# Patient Record
Sex: Female | Born: 1950 | Race: Black or African American | Hispanic: No | Marital: Single | Smoking: Never smoker
Health system: Southern US, Community
[De-identification: ages and names within clinical notes are randomized; demographics above are authoritative.]

## PROBLEM LIST (undated history)

## (undated) DIAGNOSIS — I4892 Unspecified atrial flutter: Secondary | ICD-10-CM

## (undated) DIAGNOSIS — I480 Paroxysmal atrial fibrillation: Secondary | ICD-10-CM

## (undated) DIAGNOSIS — I10 Essential (primary) hypertension: Secondary | ICD-10-CM

## (undated) DIAGNOSIS — G4733 Obstructive sleep apnea (adult) (pediatric): Secondary | ICD-10-CM

## (undated) HISTORY — DX: Unspecified atrial flutter: I48.92

## (undated) HISTORY — PX: TUBAL LIGATION: SHX77

## (undated) HISTORY — DX: Obstructive sleep apnea (adult) (pediatric): G47.33

---

## 2004-02-13 ENCOUNTER — Emergency Department (HOSPITAL_COMMUNITY): Admission: EM | Admit: 2004-02-13 | Discharge: 2004-02-13 | Payer: Self-pay | Admitting: Emergency Medicine

## 2010-03-07 ENCOUNTER — Encounter: Payer: Self-pay | Admitting: Internal Medicine

## 2011-01-01 ENCOUNTER — Emergency Department (HOSPITAL_COMMUNITY)
Admission: EM | Admit: 2011-01-01 | Discharge: 2011-01-01 | Disposition: A | Discharge status: Home / Self Care | Payer: 59 | Attending: Emergency Medicine | Admitting: Emergency Medicine

## 2011-01-01 DIAGNOSIS — R059 Cough, unspecified: Secondary | ICD-10-CM | POA: Insufficient documentation

## 2011-01-01 DIAGNOSIS — R070 Pain in throat: Secondary | ICD-10-CM | POA: Insufficient documentation

## 2011-01-01 DIAGNOSIS — IMO0001 Reserved for inherently not codable concepts without codable children: Secondary | ICD-10-CM | POA: Insufficient documentation

## 2011-01-01 DIAGNOSIS — R112 Nausea with vomiting, unspecified: Secondary | ICD-10-CM | POA: Insufficient documentation

## 2011-01-01 DIAGNOSIS — R5381 Other malaise: Secondary | ICD-10-CM | POA: Insufficient documentation

## 2011-01-01 DIAGNOSIS — J40 Bronchitis, not specified as acute or chronic: Secondary | ICD-10-CM | POA: Insufficient documentation

## 2011-01-01 DIAGNOSIS — R05 Cough: Secondary | ICD-10-CM | POA: Insufficient documentation

## 2011-01-01 DIAGNOSIS — J069 Acute upper respiratory infection, unspecified: Secondary | ICD-10-CM | POA: Insufficient documentation

## 2011-01-01 MED ORDER — PROMETHAZINE-CODEINE 6.25-10 MG/5ML PO SYRP: 5.0000 mL | ORAL_SOLUTION | ORAL | Status: AC | PRN

## 2011-01-01 MED ORDER — PREDNISONE 10 MG PO TABS: ORAL_TABLET | ORAL | Status: DC

## 2011-01-01 MED ORDER — IPRATROPIUM BROMIDE 0.03 % NA SOLN: 2.0000 | Freq: Two times a day (BID) | NASAL | Status: DC

## 2011-01-01 MED ORDER — ONDANSETRON 4 MG PO TBDP
4.0000 mg | ORAL_TABLET | Freq: Once | ORAL | Status: AC
  Administered 2011-01-01: 4 mg via ORAL
  Filled 2011-01-01: qty 1

## 2011-01-01 MED ORDER — ALBUTEROL SULFATE HFA 108 (90 BASE) MCG/ACT IN AERS
2.0000 | INHALATION_SPRAY | RESPIRATORY_TRACT | Status: DC
  Administered 2011-01-01: 2 via RESPIRATORY_TRACT
  Filled 2011-01-01: qty 6.7

## 2011-01-01 MED ORDER — AEROCHAMBER PLUS W/MASK MISC
1.0000 | Freq: Once | Status: AC
  Administered 2011-01-01: 1

## 2011-01-01 MED ORDER — PREDNISONE 20 MG PO TABS
60.0000 mg | ORAL_TABLET | Freq: Once | ORAL | Status: AC
  Administered 2011-01-01: 60 mg via ORAL
  Filled 2011-01-01: qty 3

## 2011-01-01 NOTE — ED Notes (Signed)
Ordered inhaler and aero chamber given to patient by respiratory therapist.

## 2011-01-01 NOTE — ED Notes (Signed)
Pt reports started having cold symptoms Wednesday while at work.  C/O body aches and vomiting since yesterday.  Pt hoarse, audible wheezing.  Reports productive cough with clear sputum.  Unsure if has had fevers.

## 2011-01-01 NOTE — ED Provider Notes (Signed)
Medical screening examination/treatment/procedure(s) were performed by non-physician practitioner and as supervising physician I was immediately available for consultation/collaboration.   Shelda Jakes, MD 01/01/11 [phone]

## 2011-01-01 NOTE — ED Provider Notes (Signed)
History     CSN: [id number] Arrival date & time: 01/01/2011  8:08 AM   First MD Initiated Contact with Patient 01/01/11 [phone]      Chief Complaint  Patient presents with  . URI    (Consider location/radiation/quality/duration/timing/severity/associated sxs/prior treatment) Patient is a 60 y.o. female presenting with URI. The history is provided by the patient.  URI The primary symptoms include fatigue, sore throat, cough, wheezing, nausea, vomiting and myalgias. Primary symptoms do not include ear pain, abdominal pain or arthralgias. The current episode started 3 to 5 days ago. This is a new problem.  The onset of the illness is associated with exposure to sick contacts. Symptoms associated with the illness include chills, sinus pressure and congestion. Risk factors: asthma.    Past Medical History  Diagnosis Date  . Asthma     History reviewed. No pertinent past surgical history.  No family history on file.  History  Substance Use Topics  . Smoking status: Never Smoker   . Smokeless tobacco: Not on file  . Alcohol Use: No    OB History    Grav Para Term Preterm Abortions TAB SAB Ect Mult Living                  Review of Systems  Constitutional: Positive for chills and fatigue. Negative for activity change.       All ROS Neg except as noted in HPI  HENT: Positive for congestion, sore throat and sinus pressure. Negative for ear pain, nosebleeds and neck pain.   Eyes: Negative for photophobia and discharge.  Respiratory: Positive for cough and wheezing. Negative for shortness of breath.   Cardiovascular: Negative for chest pain and palpitations.  Gastrointestinal: Positive for nausea and vomiting. Negative for abdominal pain and blood in stool.  Genitourinary: Negative for dysuria, frequency and hematuria.  Musculoskeletal: Positive for myalgias. Negative for back pain and arthralgias.  Skin: Negative.   Neurological: Negative for dizziness, seizures and speech  difficulty.  Psychiatric/Behavioral: Negative for hallucinations and confusion.    Allergies  Strawberry c  Home Medications  No current outpatient prescriptions on file.  BP 145/67  Pulse 73  Temp(Src) 98.9 F (37.2 C) (Oral)  Resp 22  Ht 5\' 5"  (1.651 m)  Wt 262 lb (118.842 kg)  BMI 43.60 kg/m2  SpO2 97%  Physical Exam  Nursing note and vitals reviewed. Constitutional: She is oriented to person, place, and time. She appears well-developed and well-nourished.  Non-toxic appearance.  HENT:  Head: Normocephalic.  Right Ear: Tympanic membrane and external ear normal.  Left Ear: Tympanic membrane and external ear normal.       Nasal congestion present.  Eyes: EOM and lids are normal. Pupils are equal, round, and reactive to light.  Neck: Normal range of motion. Neck supple. Carotid bruit is not present.  Cardiovascular: Normal rate, regular rhythm, normal heart sounds, intact distal pulses and normal pulses.  Exam reveals no friction rub.   Pulmonary/Chest: Tachypnea noted. No respiratory distress. She has wheezes. She has rhonchi.       Frequent cough  Abdominal: Soft. Bowel sounds are normal. There is no tenderness. There is no guarding.  Musculoskeletal: Normal range of motion.  Lymphadenopathy:       Head (right side): No submandibular adenopathy present.       Head (left side): No submandibular adenopathy present.    She has no cervical adenopathy.  Neurological: She is alert and oriented to person, place, and time. She has  normal strength. No cranial nerve deficit or sensory deficit.  Skin: Skin is warm and dry.  Psychiatric: She has a normal mood and affect. Her speech is normal.    ED Course: 9:37 - Pt breathing much easier. Wheezing improving. No vomiting after medications.  Procedures (including critical care time)  Labs Reviewed - No data to display No results found.   Dx: 1. Bronchitis  2. URI   MDM  I have reviewed nursing notes, vital signs, and all  appropriate lab and imaging results for this patient. Pt breathing easier. Wheezing improving. Pulse Ox in acceptable range. Safe for pt to be d/c home. She is to return if any changes or problem.        Kathie Dike, [place] 01/01/11 [phone]

## 2013-07-31 ENCOUNTER — Encounter (INDEPENDENT_AMBULATORY_CARE_PROVIDER_SITE_OTHER): Payer: Self-pay | Admitting: *Deleted

## 2013-07-31 ENCOUNTER — Other Ambulatory Visit (HOSPITAL_COMMUNITY): Payer: Self-pay | Admitting: Internal Medicine

## 2013-07-31 DIAGNOSIS — Z1231 Encounter for screening mammogram for malignant neoplasm of breast: Secondary | ICD-10-CM

## 2013-08-05 ENCOUNTER — Ambulatory Visit (HOSPITAL_COMMUNITY)
Admission: RE | Admit: 2013-08-05 | Discharge: 2013-08-05 | Disposition: A | Discharge status: Home / Self Care | Payer: 59 | Source: Ambulatory Visit | Attending: Internal Medicine | Admitting: Internal Medicine

## 2013-08-05 DIAGNOSIS — Z1231 Encounter for screening mammogram for malignant neoplasm of breast: Secondary | ICD-10-CM | POA: Insufficient documentation

## 2013-08-16 ENCOUNTER — Emergency Department (HOSPITAL_COMMUNITY)
Admission: EM | Admit: 2013-08-16 | Discharge: 2013-08-16 | Disposition: A | Discharge status: Home / Self Care | Payer: BC Managed Care – PPO | Attending: Emergency Medicine | Admitting: Emergency Medicine

## 2013-08-16 ENCOUNTER — Emergency Department (HOSPITAL_COMMUNITY): Payer: BC Managed Care – PPO

## 2013-08-16 ENCOUNTER — Encounter (HOSPITAL_COMMUNITY): Payer: Self-pay | Admitting: Emergency Medicine

## 2013-08-16 DIAGNOSIS — Z79899 Other long term (current) drug therapy: Secondary | ICD-10-CM | POA: Insufficient documentation

## 2013-08-16 DIAGNOSIS — J189 Pneumonia, unspecified organism: Secondary | ICD-10-CM

## 2013-08-16 DIAGNOSIS — J159 Unspecified bacterial pneumonia: Secondary | ICD-10-CM | POA: Insufficient documentation

## 2013-08-16 DIAGNOSIS — J40 Bronchitis, not specified as acute or chronic: Secondary | ICD-10-CM

## 2013-08-16 DIAGNOSIS — J45901 Unspecified asthma with (acute) exacerbation: Secondary | ICD-10-CM | POA: Insufficient documentation

## 2013-08-16 DIAGNOSIS — M25579 Pain in unspecified ankle and joints of unspecified foot: Secondary | ICD-10-CM | POA: Insufficient documentation

## 2013-08-16 LAB — CBC WITH DIFFERENTIAL/PLATELET
BASOS PCT: 0 % (ref 0–1)
Basophils Absolute: 0 10*3/uL (ref 0.0–0.1)
EOS ABS: 0.3 10*3/uL (ref 0.0–0.7)
Eosinophils Relative: 4 % (ref 0–5)
HCT: 37.2 % (ref 36.0–46.0)
Hemoglobin: 12.5 g/dL (ref 12.0–15.0)
Lymphocytes Relative: 19 % (ref 12–46)
Lymphs Abs: 1.4 10*3/uL (ref 0.7–4.0)
MCH: 29.8 pg (ref 26.0–34.0)
MCHC: 33.6 g/dL (ref 30.0–36.0)
MCV: 88.8 fL (ref 78.0–100.0)
Monocytes Absolute: 0.4 10*3/uL (ref 0.1–1.0)
Monocytes Relative: 6 % (ref 3–12)
NEUTROS ABS: 5.1 10*3/uL (ref 1.7–7.7)
NEUTROS PCT: 71 % (ref 43–77)
PLATELETS: 190 10*3/uL (ref 150–400)
RBC: 4.19 MIL/uL (ref 3.87–5.11)
RDW: 13.8 % (ref 11.5–15.5)
WBC: 7.3 10*3/uL (ref 4.0–10.5)

## 2013-08-16 LAB — BASIC METABOLIC PANEL
ANION GAP: 12 (ref 5–15)
BUN: 8 mg/dL (ref 6–23)
CALCIUM: 9.2 mg/dL (ref 8.4–10.5)
CO2: 26 mEq/L (ref 19–32)
CREATININE: 0.7 mg/dL (ref 0.50–1.10)
Chloride: 105 mEq/L (ref 96–112)
Glucose, Bld: 107 mg/dL — ABNORMAL HIGH (ref 70–99)
Potassium: 3.7 mEq/L (ref 3.7–5.3)
Sodium: 143 mEq/L (ref 137–147)

## 2013-08-16 MED ORDER — PREDNISONE 50 MG PO TABS
60.0000 mg | ORAL_TABLET | Freq: Once | ORAL | Status: AC
  Administered 2013-08-16: 60 mg via ORAL
  Filled 2013-08-16 (×2): qty 1

## 2013-08-16 MED ORDER — AZITHROMYCIN 250 MG PO TABS
500.0000 mg | ORAL_TABLET | Freq: Once | ORAL | Status: AC
  Administered 2013-08-16: 500 mg via ORAL
  Filled 2013-08-16: qty 2

## 2013-08-16 MED ORDER — IPRATROPIUM-ALBUTEROL 0.5-2.5 (3) MG/3ML IN SOLN
3.0000 mL | Freq: Once | RESPIRATORY_TRACT | Status: AC
  Administered 2013-08-16: 3 mL via RESPIRATORY_TRACT
  Filled 2013-08-16: qty 3

## 2013-08-16 MED ORDER — DOXYCYCLINE HYCLATE 100 MG PO CAPS: 100.0000 mg | ORAL_CAPSULE | Freq: Two times a day (BID) | ORAL | Status: DC

## 2013-08-16 MED ORDER — PREDNISONE 50 MG PO TABS: ORAL_TABLET | ORAL | Status: DC

## 2013-08-16 MED ORDER — ALBUTEROL SULFATE HFA 108 (90 BASE) MCG/ACT IN AERS
2.0000 | INHALATION_SPRAY | Freq: Once | RESPIRATORY_TRACT | Status: AC
  Administered 2013-08-16: 2 via RESPIRATORY_TRACT
  Filled 2013-08-16: qty 6.7

## 2013-08-16 MED ORDER — CEFTRIAXONE SODIUM 1 G IJ SOLR
1.0000 g | Freq: Once | INTRAMUSCULAR | Status: AC
  Administered 2013-08-16: 1 g via INTRAVENOUS
  Filled 2013-08-16: qty 10

## 2013-08-16 MED ORDER — ALBUTEROL (5 MG/ML) CONTINUOUS INHALATION SOLN
10.0000 mg/h | INHALATION_SOLUTION | Freq: Once | RESPIRATORY_TRACT | Status: AC
Start: 2013-08-16 — End: 2013-08-16
  Administered 2013-08-16: 10 mg/h via RESPIRATORY_TRACT
  Filled 2013-08-16: qty 20

## 2013-08-16 MED ORDER — ALBUTEROL SULFATE HFA 108 (90 BASE) MCG/ACT IN AERS: 1.0000 | INHALATION_SPRAY | Freq: Four times a day (QID) | RESPIRATORY_TRACT | Status: DC | PRN

## 2013-08-16 NOTE — ED Notes (Signed)
Pt reports wheezing and SOB started yesterday. Ran out of inhaler for home use. NAD noted. Speaks complete sentences.

## 2013-08-16 NOTE — ED Provider Notes (Signed)
CSN: [id number]     Arrival date & time 08/16/13  1023 History   First MD Initiated Contact with Patient 08/16/13 1056   This chart was scribed for Sharyon Cable, MD by Rosary Lively, ED scribe. This patient was seen in room [facility] and the patient's care was started at 11:00 AM.    Chief Complaint  Patient presents with  . Wheezing   Patient is a 63 y.o. female presenting with wheezing. The history is provided by the patient. No language interpreter was used.  Wheezing Severity:  Severe Severity compared to prior episodes:  More severe Onset quality:  Gradual Duration:  2 days Timing:  Constant Progression:  Worsening Associated symptoms: chest tightness and foot swelling    HPI Comments:  Carolyn Silva is a 63 y.o. female who presents to the Emergency Department complaining of severe wheezing, with associated symptoms of chest tightness, and swelling, onset 2 days ago. Pt denies vomiting, back pain, or abdominal pain. Pt denies intubation for SOB previously. Pt denies smoking.   Past Medical History  Diagnosis Date  . Asthma    Past Surgical History  Procedure Laterality Date  . Abdominal hysterectomy     History reviewed. No pertinent family history. History  Substance Use Topics  . Smoking status: Never Smoker   . Smokeless tobacco: Not on file  . Alcohol Use: No   OB History   Grav Para Term Preterm Abortions TAB SAB Ect Mult Living                 Review of Systems  Respiratory: Positive for chest tightness and wheezing.   Gastrointestinal: Negative for vomiting and abdominal pain.  Musculoskeletal: Positive for joint swelling. Negative for back pain.  All other systems reviewed and are negative.     Allergies  Strawberry c  Home Medications   Prior to Admission medications   Medication Sig Start Date End Date Taking? Authorizing Provider  albuterol (PROVENTIL HFA;VENTOLIN HFA) 108 (90 BASE) MCG/ACT inhaler Inhale 2 puffs into the lungs every  6 (six) hours as needed.      Historical Provider, MD  ipratropium (ATROVENT) 0.03 % nasal spray Place 2 sprays into the nose every 12 (twelve) hours. 01/01/11 01/01/12  Lenox Ahr, PA-C  predniSONE (DELTASONE) 10 MG tablet 6,5,4,3,2,1 - take with food 01/01/11   Lenox Ahr, PA-C   BP 152/60  Pulse 77  Temp(Src) 98.8 F (37.1 C) (Oral)  Resp 22  Ht 5\' 5"  (1.651 m)  Wt 260 lb (117.935 kg)  BMI 43.27 kg/m2  SpO2 95% Physical Exam CONSTITUTIONAL: Well developed/well nourished HEAD: Normocephalic/atraumatic EYES: EOMI/PERRL ENMT: Mucous membranes moist NECK: supple no meningeal signs SPINE:entire spine nontender CV: S1/S2 noted, no murmurs/rubs/gallops noted LUNGS: Coarse wheezing bilateraly with tachypnea ABDOMEN: soft, nontender, no rebound or guarding GU:no cva tenderness NEURO: Pt is awake/alert, moves all extremitiesx4 EXTREMITIES: pulses normal, full ROM, no LE edema noted SKIN: warm, color normal PSYCH: no abnormalities of mood noted   ED Course  Procedures  11:03AM Patient and Family informed of clinical course, understand medical decision-making process, and agree with plan.   Pt found to have pneumonia.  She was given nebulizers and steroids for her wheeze, and given antibiotics for her pneumonia.  She reports improvement.  She was ambulatory and no distress.  She wants to go home.  She had continued wheeze but insisted on going home.    We discussed strict return precautions.  I advised f/u with  PCP in one month to ensure resolution of pneumonia on CXR Pt agreeable with plan   EKG Interpretation   Date/Time:  Friday August 16 2013 11:49:00 EDT Ventricular Rate:  73 PR Interval:  173 QRS Duration: 89 QT Interval:  560 QTC Calculation: 617 R Axis:   40 Text Interpretation:  Sinus rhythm Prolonged QT interval No previous ECGs  available Confirmed by Christy Gentles  MD, Elenore Rota ([id number]) on 08/16/2013 12:01:24  PM      MDM   Final diagnoses:  CAP (community  acquired pneumonia)  Bronchitis    Nursing notes including past medical history and social history reviewed and considered in documentation xrays reviewed and considered Labs/vital reviewed and considered   I personally performed the services described in this documentation, which was scribed in my presence. The recorded information has been reviewed and is accurate.      Sharyon Cable, MD 08/16/13 [phone]

## 2013-08-16 NOTE — ED Notes (Signed)
NAD noted at time of d/c instruction.  

## 2013-08-16 NOTE — ED Notes (Signed)
Pt ambulated without difficulty, states she is breathing better, sats 98%

## 2013-08-16 NOTE — ED Notes (Signed)
Wheezing and cough since yesterday

## 2013-08-27 ENCOUNTER — Ambulatory Visit (INDEPENDENT_AMBULATORY_CARE_PROVIDER_SITE_OTHER): Payer: 59 | Admitting: Internal Medicine

## 2016-12-08 DIAGNOSIS — Z23 Encounter for immunization: Secondary | ICD-10-CM | POA: Diagnosis not present

## 2016-12-09 ENCOUNTER — Other Ambulatory Visit (HOSPITAL_COMMUNITY): Payer: Self-pay | Admitting: Internal Medicine

## 2016-12-09 DIAGNOSIS — Z1231 Encounter for screening mammogram for malignant neoplasm of breast: Secondary | ICD-10-CM

## 2016-12-16 ENCOUNTER — Ambulatory Visit (HOSPITAL_COMMUNITY)
Admission: RE | Admit: 2016-12-16 | Discharge: 2016-12-16 | Disposition: A | Discharge status: Home / Self Care | Payer: Medicare Other | Source: Ambulatory Visit | Attending: Internal Medicine | Admitting: Internal Medicine

## 2016-12-16 DIAGNOSIS — Z1231 Encounter for screening mammogram for malignant neoplasm of breast: Secondary | ICD-10-CM | POA: Insufficient documentation

## 2017-01-11 DIAGNOSIS — Z0000 Encounter for general adult medical examination without abnormal findings: Secondary | ICD-10-CM | POA: Diagnosis not present

## 2017-01-11 DIAGNOSIS — R7301 Impaired fasting glucose: Secondary | ICD-10-CM | POA: Diagnosis not present

## 2017-01-18 DIAGNOSIS — I10 Essential (primary) hypertension: Secondary | ICD-10-CM | POA: Diagnosis not present

## 2017-05-15 DIAGNOSIS — J060 Acute laryngopharyngitis: Secondary | ICD-10-CM | POA: Diagnosis not present

## 2017-06-27 DIAGNOSIS — E119 Type 2 diabetes mellitus without complications: Secondary | ICD-10-CM | POA: Diagnosis not present

## 2017-06-27 DIAGNOSIS — E782 Mixed hyperlipidemia: Secondary | ICD-10-CM | POA: Diagnosis not present

## 2017-06-27 DIAGNOSIS — I10 Essential (primary) hypertension: Secondary | ICD-10-CM | POA: Diagnosis not present

## 2017-06-27 DIAGNOSIS — R05 Cough: Secondary | ICD-10-CM | POA: Diagnosis not present

## 2017-06-28 DIAGNOSIS — J45909 Unspecified asthma, uncomplicated: Secondary | ICD-10-CM | POA: Diagnosis not present

## 2017-06-28 DIAGNOSIS — Z0000 Encounter for general adult medical examination without abnormal findings: Secondary | ICD-10-CM | POA: Diagnosis not present

## 2017-06-28 DIAGNOSIS — K219 Gastro-esophageal reflux disease without esophagitis: Secondary | ICD-10-CM | POA: Diagnosis not present

## 2017-06-28 DIAGNOSIS — I10 Essential (primary) hypertension: Secondary | ICD-10-CM | POA: Diagnosis not present

## 2017-07-06 ENCOUNTER — Encounter: Payer: Self-pay | Admitting: Gastroenterology

## 2017-08-10 ENCOUNTER — Ambulatory Visit: Payer: Medicare Other

## 2017-08-11 DIAGNOSIS — Z0000 Encounter for general adult medical examination without abnormal findings: Secondary | ICD-10-CM | POA: Diagnosis not present

## 2017-08-11 DIAGNOSIS — I10 Essential (primary) hypertension: Secondary | ICD-10-CM | POA: Diagnosis not present

## 2017-08-11 DIAGNOSIS — J060 Acute laryngopharyngitis: Secondary | ICD-10-CM | POA: Diagnosis not present

## 2017-08-24 ENCOUNTER — Ambulatory Visit (INDEPENDENT_AMBULATORY_CARE_PROVIDER_SITE_OTHER): Payer: Self-pay

## 2017-08-24 DIAGNOSIS — Z1211 Encounter for screening for malignant neoplasm of colon: Secondary | ICD-10-CM

## 2017-08-24 MED ORDER — NA SULFATE-K SULFATE-MG SULF 17.5-3.13-1.6 GM/177ML PO SOLN: 1.0000 | ORAL | 0 refills | Status: DC

## 2017-08-24 NOTE — Progress Notes (Signed)
Ok to schedule.

## 2017-08-24 NOTE — Progress Notes (Signed)
Gastroenterology Pre-Procedure Review  Request Date:08/24/17 Requesting Physician: Inetta Fermo ( no previous tcs)  PATIENT REVIEW QUESTIONS: The patient responded to the following health history questions as indicated:    1. Diabetes Melitis: no 2. Joint replacements in the past 12 months: no 3. Major health problems in the past 3 months: no 4. Has an artificial valve or MVP: no 5. Has a defibrillator: no 6. Has been advised in past to take antibiotics in advance of a procedure like teeth cleaning: no 7. Family history of colon cancer: no  8. Alcohol Use: no 9. History of sleep apnea: no  10. History of coronary artery or other vascular stents placed within the last 12 months: no 11. History of any prior anesthesia complications: no    MEDICATIONS & ALLERGIES:    Patient reports the following regarding taking any blood thinners:   Plavix? no Aspirin? no Coumadin? no Brilinta? no Xarelto? no Eliquis? no Pradaxa? no Savaysa? no Effient? no  Patient confirms/reports the following medications:  Current Outpatient Medications  Medication Sig Dispense Refill  . albuterol (PROVENTIL HFA;VENTOLIN HFA) 108 (90 BASE) MCG/ACT inhaler Inhale 2 puffs into the lungs every 6 (six) hours as needed.      Marland Kitchen lisinopril (PRINIVIL,ZESTRIL) 5 MG tablet 5 mg daily.     No current facility-administered medications for this visit.     Patient confirms/reports the following allergies:  Allergies  Allergen Reactions  . Strawberry C [Ascorbic Acid]     No orders of the defined types were placed in this encounter.   AUTHORIZATION INFORMATION Primary Insurance: [place],  [place] #: [id number] Pre-Cert / Josem Kaufmann required: no   SCHEDULE INFORMATION: Procedure has been scheduled as follows:  Date: 09/14/17, Time: 2:00 Location: [facility] [address]   This Gastroenterology Pre-Precedure Review Form is being routed to the following provider(s): EG

## 2017-08-24 NOTE — Patient Instructions (Signed)
Carolyn Silva  1950-08-03 MRN: [id number]     Procedure Date: 09/14/17 Time to register: 1:00pm Place to register: [facility] Procedure Time: 2:00pm Scheduled provider: Barney Drain, MD    PREPARATION FOR COLONOSCOPY WITH SUPREP BOWEL PREP KIT  Note: Suprep Bowel Prep Kit is a split-dose (2day) regimen. Consumption of BOTH 6-ounce bottles is required for a complete prep.  Please notify us immediately if you are diabetic, take iron supplements, or if you are on Coumadin or any other blood thinners.                                                                                                                                                   1 DAY BEFORE PROCEDURE:  DATE: 09/13/17   DAY: Wednesday  clear liquids the entire day - NO SOLID FOOD.     At 6:00pm: Complete steps 1 through 4 below, using ONE (1) 6-ounce bottle, before going to bed. Step 1:  Pour ONE (1) 6-ounce bottle of SUPREP liquid into the mixing container.  Step 2:  Add cool drinking water to the 16 ounce line on the container and mix.  Note: Dilute the solution concentrate as directed prior to use. Step 3:  DRINK ALL the liquid in the container. Step 4:  You MUST drink an additional two (2) or more 16 ounce containers of water over the next one (1) hour.   Continue clear liquids.  DAY OF PROCEDURE:   DATE: 09/14/17   DAY: Thursday If you take medications for your heart, blood pressure, or breathing, you may take these medications.    5 hours before your procedure at : 9:00am Step 1:  Pour ONE (1) 6-ounce bottle of SUPREP liquid into the mixing container.  Step 2:  Add cool drinking water to the 16 ounce line on the container and mix.  Note: Dilute the solution concentrate as directed prior to use. Step 3:  DRINK ALL the liquid in the container. Step 4:  You MUST drink an additional two (2) or more 16 ounce containers of water over the next one (1) hour. You MUST complete the final glass of water at  least 3 hours before your colonoscopy.   Nothing by mouth past:11:00am  You may take your morning medications with sip of water unless we have instructed otherwise.    Please see below for Dietary Information.  CLEAR LIQUIDS INCLUDE:  Water Jello (NOT red in color)   Ice Popsicles (NOT red in color)   Tea (sugar ok, no milk/cream) Powdered fruit flavored drinks  Coffee (sugar ok, no milk/cream) Gatorade/ Lemonade/ Kool-Aid  (NOT red in color)   Juice: apple, white grape, white cranberry Soft drinks  Clear bullion, consomme, broth (fat free beef/chicken/vegetable)  Carbonated beverages (any kind)  Strained chicken noodle soup Hard Candy   Remember: Clear  liquids are liquids that will allow you to see your fingers on the other side of a clear glass. Be sure liquids are NOT red in color, and not cloudy, but CLEAR.  DO NOT EAT OR DRINK ANY OF THE FOLLOWING:  Dairy products of any kind   Cranberry juice Tomato juice / V8 juice   Grapefruit juice Orange juice     Red grape juice  Do not eat any solid foods, including such foods as: cereal, oatmeal, yogurt, fruits, vegetables, creamed soups, eggs, bread, crackers, pureed foods in a blender, etc.   HELPFUL HINTS FOR DRINKING PREP SOLUTION:   Make sure prep is extremely cold. Mix and refrigerate the the morning of the prep. You may also put in the freezer.   You may try mixing some Crystal Light or Country Time Lemonade if you prefer. Mix in small amounts; add more if necessary.  Try drinking through a straw  Rinse mouth with water or a mouthwash between glasses, to remove after-taste.  Try sipping on a cold beverage /ice/ popsicles between glasses of prep.  Place a piece of sugar-free hard candy in mouth between glasses.  If you become nauseated, try consuming smaller amounts, or stretch out the time between glasses. Stop for 30-60 minutes, then slowly start back drinking.     OTHER INSTRUCTIONS  You will need a responsible  adult at least 66 years of age to accompany you and drive you home. This person must remain in the waiting room during your procedure. The hospital will cancel your procedure if you do not have a responsible adult with you.   1. Wear loose fitting clothing that is easily removed. 2. Leave jewelry and other valuables at home.  3. Remove all body piercing jewelry and leave at home. 4. Total time from sign-in until discharge is approximately 2-3 hours. 5. You should go home directly after your procedure and rest. You can resume normal activities the day after your procedure. 6. The day of your procedure you should not:  Drive  Make legal decisions  Operate machinery  Drink alcohol  Return to work   You may call the office (Dept: [phone]) before 5:00pm, or page the doctor on call [phone]) after 5:00pm, for further instructions, if necessary.   Insurance Information YOU WILL NEED TO CHECK WITH YOUR INSURANCE COMPANY FOR THE BENEFITS OF COVERAGE YOU HAVE FOR THIS PROCEDURE.  UNFORTUNATELY, NOT ALL INSURANCE COMPANIES HAVE BENEFITS TO COVER ALL OR PART OF THESE TYPES OF PROCEDURES.  IT IS YOUR RESPONSIBILITY TO CHECK YOUR BENEFITS, HOWEVER, WE WILL BE GLAD TO ASSIST YOU WITH ANY CODES YOUR INSURANCE COMPANY MAY NEED.    PLEASE NOTE THAT MOST INSURANCE COMPANIES WILL NOT COVER A SCREENING COLONOSCOPY FOR PEOPLE UNDER THE AGE OF 50  IF YOU HAVE BC[employer] INSURANCE, YOU MAY HAVE BENEFITS FOR A SCREENING COLONOSCOPY BUT IF POLYPS ARE FOUND THE DIAGNOSIS WILL CHANGE AND THEN YOU MAY HAVE A DEDUCTIBLE THAT WILL NEED TO BE MET. SO PLEASE MAKE SURE YOU CHECK YOUR BENEFITS FOR A SCREENING COLONOSCOPY AS WELL AS A DIAGNOSTIC COLONOSCOPY.

## 2017-09-14 ENCOUNTER — Ambulatory Visit (HOSPITAL_COMMUNITY)
Admission: RE | Admit: 2017-09-14 | Discharge: 2017-09-14 | Disposition: A | Discharge status: Home / Self Care | Payer: Medicare Other | Source: Ambulatory Visit | Attending: Gastroenterology | Admitting: Gastroenterology

## 2017-09-14 ENCOUNTER — Encounter (HOSPITAL_COMMUNITY): Payer: Self-pay | Admitting: *Deleted

## 2017-09-14 ENCOUNTER — Other Ambulatory Visit: Payer: Self-pay

## 2017-09-14 ENCOUNTER — Encounter (HOSPITAL_COMMUNITY)
Admission: RE | Disposition: A | Discharge status: Home / Self Care | Payer: Self-pay | Source: Ambulatory Visit | Attending: Gastroenterology

## 2017-09-14 DIAGNOSIS — D128 Benign neoplasm of rectum: Secondary | ICD-10-CM | POA: Diagnosis not present

## 2017-09-14 DIAGNOSIS — D122 Benign neoplasm of ascending colon: Secondary | ICD-10-CM

## 2017-09-14 DIAGNOSIS — D123 Benign neoplasm of transverse colon: Secondary | ICD-10-CM | POA: Diagnosis not present

## 2017-09-14 DIAGNOSIS — K648 Other hemorrhoids: Secondary | ICD-10-CM | POA: Insufficient documentation

## 2017-09-14 DIAGNOSIS — K5730 Diverticulosis of large intestine without perforation or abscess without bleeding: Secondary | ICD-10-CM | POA: Diagnosis not present

## 2017-09-14 DIAGNOSIS — J45909 Unspecified asthma, uncomplicated: Secondary | ICD-10-CM | POA: Insufficient documentation

## 2017-09-14 DIAGNOSIS — Z1211 Encounter for screening for malignant neoplasm of colon: Secondary | ICD-10-CM | POA: Insufficient documentation

## 2017-09-14 DIAGNOSIS — I10 Essential (primary) hypertension: Secondary | ICD-10-CM | POA: Diagnosis not present

## 2017-09-14 DIAGNOSIS — Z79899 Other long term (current) drug therapy: Secondary | ICD-10-CM | POA: Diagnosis not present

## 2017-09-14 HISTORY — PX: COLONOSCOPY: SHX5424

## 2017-09-14 HISTORY — DX: Essential (primary) hypertension: I10

## 2017-09-14 SURGERY — COLONOSCOPY
Anesthesia: Moderate Sedation

## 2017-09-14 MED ORDER — STERILE WATER FOR IRRIGATION IR SOLN
Status: DC | PRN
  Administered 2017-09-14: 1.5 mL

## 2017-09-14 MED ORDER — MIDAZOLAM HCL 5 MG/5ML IJ SOLN
INTRAMUSCULAR | Status: DC | PRN
  Administered 2017-09-14 (×2): 2 mg via INTRAVENOUS

## 2017-09-14 MED ORDER — MEPERIDINE HCL 100 MG/ML IJ SOLN
INTRAMUSCULAR | Status: AC
  Filled 2017-09-14: qty 2

## 2017-09-14 MED ORDER — MIDAZOLAM HCL 5 MG/5ML IJ SOLN
INTRAMUSCULAR | Status: AC
  Filled 2017-09-14: qty 10

## 2017-09-14 MED ORDER — MEPERIDINE HCL 100 MG/ML IJ SOLN
INTRAMUSCULAR | Status: DC | PRN
  Administered 2017-09-14 (×2): 25 mg via INTRAVENOUS

## 2017-09-14 MED ORDER — SODIUM CHLORIDE 0.9 % IV SOLN
INTRAVENOUS | Status: DC
  Administered 2017-09-14: 14:00:00 via INTRAVENOUS

## 2017-09-14 NOTE — Op Note (Signed)
[facility] Patient Name: Carolyn Silva Procedure Date: 09/14/2017 1:58 PM MRN: [id number] Date of Birth: 11/14/1950 Attending MD: Barney Drain MD, MD CSN: [id number] Age: 66 Admit Type: Outpatient Procedure:                Colonoscopy WITH COLD FORCEPS/SNARE & SNARE CAUTERY                            POLYPECTOMY Indications:              Screening for colorectal malignant neoplasm Providers:                Barney Drain MD, MD, Lurline Del, RN, Aram Candela Referring MD:             Edwinna Areola. [place] Crane MD Medicines:                Meperidine 50 mg IV, Midazolam 4 mg IV Complications:            No immediate complications. Estimated Blood Loss:     Estimated blood loss was minimal. Procedure:                Pre-Anesthesia Assessment:                           - Prior to the procedure, a History and Physical                            was performed, and patient medications and                            allergies were reviewed. The patient's tolerance of                            previous anesthesia was also reviewed. The risks                            and benefits of the procedure and the sedation                            options and risks were discussed with the patient.                            All questions were answered, and informed consent                            was obtained. Prior Anticoagulants: The patient has                            taken no previous anticoagulant or antiplatelet                            agents. ASA Grade Assessment: II - A patient with                            mild systemic disease. After reviewing the risks  and benefits, the patient was deemed in                            satisfactory condition to undergo the procedure.                            After obtaining informed consent, the colonoscope                            was passed under direct vision. Throughout the                            procedure, the  patient's blood pressure, pulse, and                            oxygen saturations were monitored continuously. The                            CF-[id number] ([id number]) scope was introduced through                            the anus and advanced to the the cecum, identified                            by appendiceal orifice and ileocecal valve. The                            colonoscopy was somewhat difficult due to a                            redundant colon. Successful completion of the                            procedure was aided by straightening and shortening                            the scope to obtain bowel loop reduction and                            COLOWRAP. The patient tolerated the procedure well.                            The quality of the bowel preparation was excellent.                            The ileocecal valve, appendiceal orifice, and                            rectum were photographed. Scope In: 2:46:53 PM Scope Out: 3:15:00 PM Scope Withdrawal Time: 0 hours 23 minutes 50 seconds  Total Procedure Duration: 0 hours 28 minutes 7 seconds  Findings:      Four sessile polyps were found in the rectum, hepatic flexure and       ascending colon(2). The  polyps were 2 to 4 mm in size. These polyps were       removed with a cold biopsy forceps. Resection and retrieval were       complete.      Three sessile polyps were found in the transverse colon and ascending       colon(2). The polyps were 5 to 15 mm in size. These polyps were removed       with a hot snare. Resection and retrieval were complete VIA RETRIEVEAL       NET(15 MM).      A 5 mm polyp was found in the ascending colon. The polyp was sessile.       The polyp was removed with a cold snare. Resection and retrieval were       complete.      Multiple small and large-mouthed diverticula were found in the       recto-sigmoid colon, sigmoid colon and descending colon.      Internal hemorrhoids were found during  retroflexion. The hemorrhoids       were small. Impression:               - Four 2 to 4 mm polyps in the rectum, at the                            hepatic flexure and in the ascending colon(2),                            removed with a cold biopsy forceps. Resected and                            retrieved.                           - Three 5 to 15 mm polyps in the transverse colon                            and in the ascending colon, removed with a hot                            snare. Resected and retrieved.                           - One 5 mm polyp in the ascending colon, removed                            with a cold snare. Resected and retrieved.                           - MODERATE Diverticulosis in the LEFT colon.                           - Internal hemorrhoids. Moderate Sedation:      Moderate (conscious) sedation was administered by the endoscopy nurse       and supervised by the endoscopist. The following parameters were       monitored: oxygen saturation, heart rate, blood pressure, and response  to care. Total physician intraservice time was 40 minutes. Recommendation:           - Patient has a contact number available for                            emergencies. The signs and symptoms of potential                            delayed complications were discussed with the                            patient. Return to normal activities tomorrow.                            Written discharge instructions were provided to the                            patient.                           - High fiber diet.                           - Continue present medications.                           - Await pathology results.                           - Repeat colonoscopy 1-3 YEARS for surveillance. Procedure Code(s):        --- Professional ---                           [phone], Colonoscopy, flexible; with removal of                            tumor(s), polyp(s), or other lesion(s) by  snare                            technique                           45380, 59, Colonoscopy, flexible; with biopsy,                            single or multiple                           G0500, Moderate sedation services provided by the                            same physician or other qualified health care                            professional performing a gastrointestinal  endoscopic service that sedation supports,                            requiring the presence of an independent trained                            observer to assist in the monitoring of the                            patient's level of consciousness and physiological                            status; initial 15 minutes of intra-service time;                            patient age 35 years or older (additional time may                            be reported with [phone], as appropriate)                           [phone], Moderate sedation services provided by the                            same physician or other qualified health care                            professional performing the diagnostic or                            therapeutic service that the sedation supports,                            requiring the presence of an independent trained                            observer to assist in the monitoring of the                            patient's level of consciousness and physiological                            status; each additional 15 minutes intraservice                            time (List separately in addition to code for                            primary service)                           [phone], Moderate sedation services provided by the  same physician or other qualified health care                            professional performing the diagnostic or                            therapeutic service that the sedation supports,                             requiring the presence of an independent trained                            observer to assist in the monitoring of the                            patient's level of consciousness and physiological                            status; each additional 15 minutes intraservice                            time (List separately in addition to code for                            primary service) Diagnosis Code(s):        --- Professional ---                           Z12.11, Encounter for screening for malignant                            neoplasm of colon                           K62.1, Rectal polyp                           D12.3, Benign neoplasm of transverse colon (hepatic                            flexure or splenic flexure)                           D12.2, Benign neoplasm of ascending colon                           K64.8, Other hemorrhoids                           K57.30, Diverticulosis of large intestine without                            perforation or abscess without bleeding CPT copyright 2017 [employer]. All rights reserved. The codes documented in this report are preliminary and upon coder review may  be revised to meet current compliance requirements. Barney Drain, MD Barney Drain MD,  MD 09/14/2017 3:34:07 PM This report has been signed electronically. Number of Addenda: 0

## 2017-09-14 NOTE — Discharge Instructions (Signed)
You have small internal hemorrhoids and diverticulosis IN YOUR LEFT COLON. YOU HAD EIGHT POLYPS REMOVED.    DRINK WATER TO KEEP YOUR URINE LIGHT YELLOW.  CONTINUE YOUR WEIGHT LOSS EFFORTS. YOUR BODY MASS INDEX IS OVER 40 WHICH MEANS YOU ARE MORBIDLY OBESE. OBESITY IS ASSOCIATED WITH AN INCREASED FOR CIRRHOSIS AND ALL CANCERS, INCLUDING ESOPHAGEAL AND COLON CANCER. A WEIGHT OF 245 LBS OR LESS WILL GET YOUR BODY MASS INDEX(BMI) UNDER 40 BUT YOUR BODY MASS INDEX WILL STILL BE OVER 30 WHICH MEANS YOU ARE OBESE.  A WEIGHT OF 180 LBS OR LESS  WILL GET YOUR BODY MASS INDEX(BMI) UNDER 30.  IF YOU HAVE DIFFICULTY LOSING WEIGHT OVER THE NEXT YEAR, PLEASE CALL THE OFFICE IF YOU WOULD LIKE A REFERRAL TO THE BARIATRIC WEIGHT LOSS CLINIC.  FOLLOW A HIGH FIBER DIET. AVOID ITEMS THAT CAUSE BLOATING. See info below.  YOUR BIOPSY RESULTS WILL BE AVAILABLE IN 7 DAYS.  USE PREPARATION H FOUR TIMES  A DAY IF NEEDED TO RELIEVE RECTAL PAIN/PRESSURE/BLEEDING.\  Next colonoscopy in 1-3 years.  Colonoscopy Care After Read the instructions outlined below and refer to this sheet in the next week. These discharge instructions provide you with general information on caring for yourself after you leave the hospital. While your treatment has been planned according to the most current medical practices available, unavoidable complications occasionally occur. If you have any problems or questions after discharge, call DR. Adison Reifsteck, [phone].  ACTIVITY  You may resume your regular activity, but move at a slower pace for the next 24 hours.   Take frequent rest periods for the next 24 hours.   Walking will help get rid of the air and reduce the bloated feeling in your belly (abdomen).   No driving for 24 hours (because of the medicine (anesthesia) used during the test).   You may shower.   Do not sign any important legal documents or operate any machinery for 24 hours (because of the anesthesia used during the test).      NUTRITION  Drink plenty of fluids.   You may resume your normal diet as instructed by your doctor.   Begin with a light meal and progress to your normal diet. Heavy or fried foods are harder to digest and may make you feel sick to your stomach (nauseated).   Avoid alcoholic beverages for 24 hours or as instructed.    MEDICATIONS  You may resume your normal medications.   WHAT YOU CAN EXPECT TODAY  Some feelings of bloating in the abdomen.   Passage of more gas than usual.   Spotting of blood in your stool or on the toilet paper  .  IF YOU HAD POLYPS REMOVED DURING THE COLONOSCOPY:  Eat a soft diet IF YOU HAVE NAUSEA, BLOATING, ABDOMINAL PAIN, OR VOMITING.    FINDING OUT THE RESULTS OF YOUR TEST Not all test results are available during your visit. DR. Oneida Alar WILL CALL YOU WITHIN 14 DAYS OF YOUR PROCEDUE WITH YOUR RESULTS. Do not assume everything is normal if you have not heard from DR. Coltan Spinello, CALL HER OFFICE AT [phone].  SEEK IMMEDIATE MEDICAL ATTENTION AND CALL THE OFFICE: [phone] IF:  You have more than a spotting of blood in your stool.   Your belly is swollen (abdominal distention).   You are nauseated or vomiting.   You have a temperature over 101F.   You have abdominal pain or discomfort that is severe or gets worse throughout the day.  High-Fiber Diet A high-fiber  diet changes your normal diet to include more whole grains, legumes, fruits, and vegetables. Changes in the diet involve replacing refined carbohydrates with unrefined foods. The calorie level of the diet is essentially unchanged. The Dietary Reference Intake (recommended amount) for adult males is 38 grams per day. For adult females, it is 25 grams per day. Pregnant and lactating women should consume 28 grams of fiber per day. Fiber is the intact part of a plant that is not broken down during digestion. Functional fiber is fiber that has been isolated from the plant to provide a  beneficial effect in the body. PURPOSE  Increase stool bulk.   Ease and regulate bowel movements.   Lower cholesterol.   REDUCE RISK OF COLON CANCER  INDICATIONS THAT YOU NEED MORE FIBER  Constipation and hemorrhoids.   Uncomplicated diverticulosis (intestine condition) and irritable bowel syndrome.   Weight management.   As a protective measure against hardening of the arteries (atherosclerosis), diabetes, and cancer.   GUIDELINES FOR INCREASING FIBER IN THE DIET  Start adding fiber to the diet slowly. A gradual increase of about 5 more grams (2 slices of whole-wheat bread, 2 servings of most fruits or vegetables, or 1 bowl of high-fiber cereal) per day is best. Too rapid an increase in fiber may result in constipation, flatulence, and bloating.   Drink enough water and fluids to keep your urine clear or pale yellow. Water, juice, or caffeine-free drinks are recommended. Not drinking enough fluid may cause constipation.   Eat a variety of high-fiber foods rather than one type of fiber.   Try to increase your intake of fiber through using high-fiber foods rather than fiber pills or supplements that contain small amounts of fiber.   The goal is to change the types of food eaten. Do not supplement your present diet with high-fiber foods, but replace foods in your present diet.   INCLUDE A VARIETY OF FIBER SOURCES  Replace refined and processed grains with whole grains, canned fruits with fresh fruits, and incorporate other fiber sources. White rice, white breads, and most bakery goods contain little or no fiber.   Brown whole-grain rice, buckwheat oats, and many fruits and vegetables are all good sources of fiber. These include: broccoli, Brussels sprouts, cabbage, cauliflower, beets, sweet potatoes, white potatoes (skin on), carrots, tomatoes, eggplant, squash, berries, fresh fruits, and dried fruits.   Cereals appear to be the richest source of fiber. Cereal fiber is found in  whole grains and bran. Bran is the fiber-rich outer coat of cereal grain, which is largely removed in refining. In whole-grain cereals, the bran remains. In breakfast cereals, the largest amount of fiber is found in those with "bran" in their names. The fiber content is sometimes indicated on the label.   You may need to include additional fruits and vegetables each day.   In baking, for 1 cup white flour, you may use the following substitutions:   1 cup whole-wheat flour minus 2 tablespoons.   1/2 cup white flour plus 1/2 cup whole-wheat flour.   Polyps, Colon  A polyp is extra tissue that grows inside your body. Colon polyps grow in the large intestine. The large intestine, also called the colon, is part of your digestive system. It is a long, hollow tube at the end of your digestive tract where your body makes and stores stool. Most polyps are not dangerous. They are benign. This means they are not cancerous. But over time, some types of polyps can turn into  cancer. Polyps that are smaller than a pea are usually not harmful. But larger polyps could someday become or may already be cancerous. To be safe, doctors remove all polyps and test them.   PREVENTION There is not one sure way to prevent polyps. You might be able to lower your risk of getting them if you:  Eat more fruits and vegetables and less fatty food.   Do not smoke.   Avoid alcohol.   Exercise every day.   Lose weight if you are overweight.   Eating more calcium and folate can also lower your risk of getting polyps. Some foods that are rich in calcium are milk, cheese, and broccoli. Some foods that are rich in folate are chickpeas, kidney beans, and spinach.    Diverticulosis Diverticulosis is a common condition that develops when small pouches (diverticula) form in the wall of the colon. The risk of diverticulosis increases with age. It happens more often in people who eat a low-fiber diet. Most individuals with  diverticulosis have no symptoms. Those individuals with symptoms usually experience belly (abdominal) pain, constipation, or loose stools (diarrhea).  HOME CARE INSTRUCTIONS  Increase the amount of fiber in your diet as directed by your caregiver or dietician. This may reduce symptoms of diverticulosis.   Drink at least 6 to 8 glasses of water each day to prevent constipation.   Try not to strain when you have a bowel movement.   Avoiding nuts and seeds to prevent complications is NOT NECESSARY.   FOODS HAVING HIGH FIBER CONTENT INCLUDE:  Fruits. Apple, peach, pear, tangerine, raisins, prunes.   Vegetables. Brussels sprouts, asparagus, broccoli, cabbage, carrot, cauliflower, romaine lettuce, spinach, summer squash, tomato, winter squash, zucchini.   Starchy Vegetables. Baked beans, kidney beans, lima beans, split peas, lentils, potatoes (with skin).   Grains. Whole wheat bread, brown rice, bran flake cereal, plain oatmeal, white rice, shredded wheat, bran muffins.   SEEK IMMEDIATE MEDICAL CARE IF:  You develop increasing pain or severe bloating.   You have an oral temperature above 101F.   You develop vomiting or bowel movements that are bloody or black.

## 2017-09-14 NOTE — H&P (Signed)
Primary Care Physician:  Celene Squibb, MD Primary Gastroenterologist:  Dr. Oneida Alar  Pre-Procedure History & Physical: HPI:  Carolyn Silva is a 67 y.o. female here for [place].  Past Medical History:  Diagnosis Date  . Asthma   . Hypertension     Past Surgical History:  Procedure Laterality Date  . TUBAL LIGATION      Prior to Admission medications   Medication Sig Start Date End Date Taking? Authorizing Provider  lisinopril (PRINIVIL,ZESTRIL) 5 MG tablet Take 5 mg by mouth daily.  08/14/17  Yes [provider]  Na Sulfate-K Sulfate-Mg Sulf (SUPREP BOWEL PREP KIT) 17.5-3.13-1.6 GM/177ML SOLN Take 1 kit by mouth as directed. 08/24/17  Yes Carlis Stable, NP  albuterol (PROVENTIL HFA;VENTOLIN HFA) 108 (90 BASE) MCG/ACT inhaler Inhale 1-2 puffs into the lungs every 6 (six) hours as needed for wheezing or shortness of breath.     [provider]    Allergies as of 08/24/2017 - Review Complete 08/24/2017  Allergen Reaction Noted  . Strawberry c [ascorbic acid]  01/01/2011    Family History  Problem Relation Age of Onset  . Diabetes Mother   . Cirrhosis Father   . Diabetes Brother   . Kidney disease Sister   . Colon cancer Neg Hx   . Colon polyps Neg Hx     Social History   Socioeconomic History  . Marital status: Single    Spouse name: Not on file  . Number of children: Not on file  . Years of education: Not on file  . Highest education level: Not on file  Occupational History  . Not on file  Social Needs  . Financial resource strain: Not on file  . Food insecurity:    Worry: Not on file    Inability: Not on file  . Transportation needs:    Medical: Not on file    Non-medical: Not on file  Tobacco Use  . Smoking status: Never Smoker  . Smokeless tobacco: Never Used  Substance and Sexual Activity  . Alcohol use: Yes    Comment: Occasional  . Drug use: No  . Sexual activity: Not on file  Lifestyle  . Physical activity:   Days per week: Not on file    Minutes per session: Not on file  . Stress: Not on file  Relationships  . Social connections:    Talks on phone: Not on file    Gets together: Not on file    Attends religious service: Not on file    Active member of club or organization: Not on file    Attends meetings of clubs or organizations: Not on file    Relationship status: Not on file  . Intimate partner violence:    Fear of current or ex partner: Not on file    Emotionally abused: Not on file    Physically abused: Not on file    Forced sexual activity: Not on file  Other Topics Concern  . Not on file  Social History Narrative  . Not on file    Review of Systems: See HPI, otherwise negative ROS   Physical Exam: BP 115/66   Pulse 76   Temp 98.5 F (36.9 C) (Oral)   Resp 16   Ht 5' 6" (1.676 m)   Wt 260 lb (117.9 kg)   SpO2 99%   BMI 41.97 kg/m  General:   Alert,  pleasant and cooperative in NAD Head:  Normocephalic and atraumatic. Neck:  Supple; Lungs:  Clear throughout to auscultation.    Heart:  Regular rate and rhythm. Abdomen:  Soft, nontender and nondistended. Normal bowel sounds, without guarding, and without rebound.   Neurologic:  Alert and  oriented x4;  grossly normal neurologically.  Impression/Plan:    SCREENING  Plan:  1. TCS TODAY DISCUSSED PROCEDURE, BENEFITS, & RISKS: < 1% chance of medication reaction, bleeding, perforation, or rupture of spleen/liver.

## 2017-09-19 ENCOUNTER — Encounter (HOSPITAL_COMMUNITY): Payer: Self-pay | Admitting: Gastroenterology

## 2017-09-21 ENCOUNTER — Telehealth: Payer: Self-pay | Admitting: Gastroenterology

## 2017-09-21 NOTE — Telephone Encounter (Signed)
PT is aware.

## 2017-09-21 NOTE — Telephone Encounter (Signed)
See result note.  

## 2017-09-21 NOTE — Telephone Encounter (Signed)
LMOM to call.

## 2017-09-21 NOTE — Telephone Encounter (Signed)
Tried to call, bad connection.

## 2017-09-21 NOTE — Telephone Encounter (Signed)
Please call pt. She had EIGHT simple adenomas removed.   DRINK WATER TO KEEP YOUR URINE LIGHT YELLOW.  CONTINUE YOUR WEIGHT LOSS EFFORTS. A WEIGHT OF 245 LBS OR LESS WILL GET YOUR BODY MASS INDEX(BMI) UNDER 40 BUT YOUR BODY MASS INDEX WILL STILL BE OVER 30 WHICH MEANS YOU ARE OBESE.  A WEIGHT OF 180 LBS OR LESS  WILL GET YOUR BODY MASS INDEX(BMI) UNDER 30.  IF YOU HAVE DIFFICULTY LOSING WEIGHT OVER THE NEXT YEAR, PLEASE CALL THE OFFICE IF YOU WOULD LIKE A REFERRAL TO THE BARIATRIC WEIGHT LOSS CLINIC.  FOLLOW A HIGH FIBER DIET. AVOID ITEMS THAT CAUSE BLOATING.   USE PREPARATION H FOUR TIMES  A DAY IF NEEDED TO RELIEVE RECTAL PAIN/PRESSURE/BLEEDING. Next colonoscopy in 3 years. YOUR SISTERS, BROTHERS, CHILDREN, AND PARENTS NEED TO HAVE A COLONOSCOPY STARTING AT THE AGE OF 40.

## 2017-09-21 NOTE — Telephone Encounter (Signed)
Pt was returning a call to DS. DS on another call. Please call [phone]

## 2017-09-25 ENCOUNTER — Other Ambulatory Visit (HOSPITAL_COMMUNITY): Payer: Self-pay | Admitting: Internal Medicine

## 2017-09-25 DIAGNOSIS — Z1231 Encounter for screening mammogram for malignant neoplasm of breast: Secondary | ICD-10-CM

## 2017-09-25 DIAGNOSIS — Z86010 Personal history of colonic polyps: Secondary | ICD-10-CM | POA: Diagnosis not present

## 2017-09-25 DIAGNOSIS — I10 Essential (primary) hypertension: Secondary | ICD-10-CM | POA: Diagnosis not present

## 2017-09-25 NOTE — Telephone Encounter (Signed)
REMINDER IN EPIC °

## 2017-10-09 DIAGNOSIS — I10 Essential (primary) hypertension: Secondary | ICD-10-CM | POA: Diagnosis not present

## 2017-12-07 ENCOUNTER — Other Ambulatory Visit: Payer: Self-pay

## 2017-12-07 NOTE — Patient Outreach (Signed)
[place] [facility]) Care Management  12/07/2017  CORLEEN OTWELL 1950/11/25 [id number]   Medication Adherence call to Mrs. Jonnell Hentges left a message for patient to call back patient is due on Lisinopril 5 mg. Mrs. Palmer is showing past due under [place].   [place] Management Direct Dial [phone]  Fax [phone] [email or web address]

## 2017-12-19 ENCOUNTER — Other Ambulatory Visit: Payer: Self-pay

## 2017-12-19 NOTE — Patient Outreach (Signed)
[place] [facility]) Care Management  12/19/2017  Carolyn Silva 09/24/1950 [id number]   Medication Adherence call to Carolyn Silva patient is not taking Lisinopril 5 mg patient is now taking Lisinopril 10 mg when we call [place] they did not know which one to fill call patient an ask if she was on 10 or 5 mg of Lisinopril she said she is now taking Lisinopril 10 mg doctor increased last appointment. Carolyn Silva is showing past due under [place].   [place] Management Direct Dial [phone]  Fax [phone] [email or web address]

## 2017-12-25 ENCOUNTER — Ambulatory Visit (HOSPITAL_COMMUNITY)
Admission: RE | Admit: 2017-12-25 | Discharge: 2017-12-25 | Disposition: A | Discharge status: Home / Self Care | Payer: Medicare Other | Source: Ambulatory Visit | Attending: Internal Medicine | Admitting: Internal Medicine

## 2017-12-25 DIAGNOSIS — Z1231 Encounter for screening mammogram for malignant neoplasm of breast: Secondary | ICD-10-CM | POA: Diagnosis not present

## 2018-01-01 DIAGNOSIS — Z23 Encounter for immunization: Secondary | ICD-10-CM | POA: Diagnosis not present

## 2018-06-20 DIAGNOSIS — Z0000 Encounter for general adult medical examination without abnormal findings: Secondary | ICD-10-CM | POA: Diagnosis not present

## 2018-07-23 DIAGNOSIS — I10 Essential (primary) hypertension: Secondary | ICD-10-CM | POA: Diagnosis not present

## 2018-08-10 DIAGNOSIS — I10 Essential (primary) hypertension: Secondary | ICD-10-CM | POA: Diagnosis not present

## 2018-08-10 DIAGNOSIS — J45909 Unspecified asthma, uncomplicated: Secondary | ICD-10-CM | POA: Diagnosis not present

## 2018-08-10 DIAGNOSIS — K219 Gastro-esophageal reflux disease without esophagitis: Secondary | ICD-10-CM | POA: Diagnosis not present

## 2018-08-21 DIAGNOSIS — R7301 Impaired fasting glucose: Secondary | ICD-10-CM | POA: Diagnosis not present

## 2018-08-21 DIAGNOSIS — I10 Essential (primary) hypertension: Secondary | ICD-10-CM | POA: Diagnosis not present

## 2018-08-21 DIAGNOSIS — J45909 Unspecified asthma, uncomplicated: Secondary | ICD-10-CM | POA: Diagnosis not present

## 2018-08-21 DIAGNOSIS — K219 Gastro-esophageal reflux disease without esophagitis: Secondary | ICD-10-CM | POA: Diagnosis not present

## 2018-08-21 DIAGNOSIS — Z0001 Encounter for general adult medical examination with abnormal findings: Secondary | ICD-10-CM | POA: Diagnosis not present

## 2019-04-29 DIAGNOSIS — I10 Essential (primary) hypertension: Secondary | ICD-10-CM | POA: Diagnosis not present

## 2019-04-29 DIAGNOSIS — R6889 Other general symptoms and signs: Secondary | ICD-10-CM | POA: Diagnosis not present

## 2019-04-29 DIAGNOSIS — R7301 Impaired fasting glucose: Secondary | ICD-10-CM | POA: Diagnosis not present

## 2019-05-02 ENCOUNTER — Ambulatory Visit: Payer: Medicare Other

## 2019-05-03 ENCOUNTER — Other Ambulatory Visit (HOSPITAL_COMMUNITY): Payer: Self-pay | Admitting: Internal Medicine

## 2019-05-03 ENCOUNTER — Ambulatory Visit: Payer: Medicare Other | Attending: Internal Medicine

## 2019-05-03 DIAGNOSIS — J45909 Unspecified asthma, uncomplicated: Secondary | ICD-10-CM | POA: Diagnosis not present

## 2019-05-03 DIAGNOSIS — Z1231 Encounter for screening mammogram for malignant neoplasm of breast: Secondary | ICD-10-CM

## 2019-05-03 DIAGNOSIS — Z23 Encounter for immunization: Secondary | ICD-10-CM

## 2019-05-03 DIAGNOSIS — R6889 Other general symptoms and signs: Secondary | ICD-10-CM | POA: Diagnosis not present

## 2019-05-03 DIAGNOSIS — R7303 Prediabetes: Secondary | ICD-10-CM | POA: Diagnosis not present

## 2019-05-03 DIAGNOSIS — R809 Proteinuria, unspecified: Secondary | ICD-10-CM | POA: Diagnosis not present

## 2019-05-03 DIAGNOSIS — Z13820 Encounter for screening for osteoporosis: Secondary | ICD-10-CM

## 2019-05-03 DIAGNOSIS — I10 Essential (primary) hypertension: Secondary | ICD-10-CM | POA: Diagnosis not present

## 2019-05-03 DIAGNOSIS — K219 Gastro-esophageal reflux disease without esophagitis: Secondary | ICD-10-CM | POA: Diagnosis not present

## 2019-05-03 NOTE — Progress Notes (Signed)
   Covid-19 Vaccination Clinic  Name:  Carolyn Silva    MRN: [id number] DOB: 1951-01-26  05/03/2019  Carolyn Silva was observed post Covid-19 immunization for 15 minutes without incident. She was provided with Vaccine Information Sheet and instruction to access the V-Safe system.   Carolyn Silva was instructed to call 911 with any severe reactions post vaccine: Marland Kitchen Difficulty breathing  . Swelling of face and throat  . A fast heartbeat  . A bad rash all over body  . Dizziness and weakness   Immunizations Administered    Name Date Dose VIS Date Route   Moderna COVID-19 Vaccine 05/03/2019 10:46 AM 0.5 mL 01/15/2019 Intramuscular   Manufacturer: Moderna   Lot: [id number]   [id number]

## 2019-06-04 ENCOUNTER — Ambulatory Visit: Payer: Medicare Other | Attending: Internal Medicine

## 2019-06-04 DIAGNOSIS — Z23 Encounter for immunization: Secondary | ICD-10-CM

## 2019-06-04 NOTE — Progress Notes (Signed)
   Covid-19 Vaccination Clinic  Name:  Carolyn Silva    MRN: [id number] DOB: 11/06/1950  06/04/2019  Carolyn Silva was observed post Covid-19 immunization for 15 minutes without incident. She was provided with Vaccine Information Sheet and instruction to access the V-Safe system.   Carolyn Silva was instructed to call 911 with any severe reactions post vaccine: Marland Kitchen Difficulty breathing  . Swelling of face and throat  . A fast heartbeat  . A bad rash all over body  . Dizziness and weakness   Immunizations Administered    Name Date Dose VIS Date Route   Moderna COVID-19 Vaccine 06/04/2019  9:21 AM 0.5 mL 01/2019 Intramuscular   Manufacturer: Moderna   Lot: [id number]   [id number]

## 2019-07-01 ENCOUNTER — Other Ambulatory Visit: Payer: Self-pay

## 2019-07-01 ENCOUNTER — Ambulatory Visit (HOSPITAL_COMMUNITY)
Admission: RE | Admit: 2019-07-01 | Discharge: 2019-07-01 | Disposition: A | Discharge status: Home / Self Care | Payer: Medicare Other | Source: Ambulatory Visit | Attending: Internal Medicine | Admitting: Internal Medicine

## 2019-07-01 ENCOUNTER — Other Ambulatory Visit (HOSPITAL_COMMUNITY): Payer: Self-pay | Admitting: Internal Medicine

## 2019-07-01 DIAGNOSIS — R0602 Shortness of breath: Secondary | ICD-10-CM | POA: Diagnosis not present

## 2019-07-01 DIAGNOSIS — R059 Cough, unspecified: Secondary | ICD-10-CM

## 2019-07-01 DIAGNOSIS — R05 Cough: Secondary | ICD-10-CM | POA: Insufficient documentation

## 2019-07-02 DIAGNOSIS — R0602 Shortness of breath: Secondary | ICD-10-CM | POA: Diagnosis not present

## 2019-07-02 DIAGNOSIS — K219 Gastro-esophageal reflux disease without esophagitis: Secondary | ICD-10-CM | POA: Diagnosis not present

## 2019-07-02 DIAGNOSIS — J45909 Unspecified asthma, uncomplicated: Secondary | ICD-10-CM | POA: Diagnosis not present

## 2019-07-02 DIAGNOSIS — I10 Essential (primary) hypertension: Secondary | ICD-10-CM | POA: Diagnosis not present

## 2019-07-02 DIAGNOSIS — J060 Acute laryngopharyngitis: Secondary | ICD-10-CM | POA: Diagnosis not present

## 2019-07-02 DIAGNOSIS — Z136 Encounter for screening for cardiovascular disorders: Secondary | ICD-10-CM | POA: Diagnosis not present

## 2019-07-02 DIAGNOSIS — I4891 Unspecified atrial fibrillation: Secondary | ICD-10-CM | POA: Diagnosis not present

## 2019-07-09 ENCOUNTER — Other Ambulatory Visit: Payer: Self-pay

## 2019-07-09 ENCOUNTER — Emergency Department (HOSPITAL_COMMUNITY): Payer: Medicare Other

## 2019-07-09 ENCOUNTER — Encounter (HOSPITAL_COMMUNITY): Payer: Self-pay | Admitting: Emergency Medicine

## 2019-07-09 ENCOUNTER — Observation Stay (HOSPITAL_COMMUNITY)
Admission: EM | Admit: 2019-07-09 | Discharge: 2019-07-11 | Disposition: A | Discharge status: Home Health | Payer: Medicare Other | Attending: Family Medicine | Admitting: Family Medicine

## 2019-07-09 DIAGNOSIS — I4891 Unspecified atrial fibrillation: Secondary | ICD-10-CM

## 2019-07-09 DIAGNOSIS — J9601 Acute respiratory failure with hypoxia: Secondary | ICD-10-CM | POA: Insufficient documentation

## 2019-07-09 DIAGNOSIS — I10 Essential (primary) hypertension: Secondary | ICD-10-CM | POA: Diagnosis not present

## 2019-07-09 DIAGNOSIS — Z20822 Contact with and (suspected) exposure to covid-19: Secondary | ICD-10-CM | POA: Diagnosis not present

## 2019-07-09 DIAGNOSIS — J45901 Unspecified asthma with (acute) exacerbation: Secondary | ICD-10-CM | POA: Diagnosis not present

## 2019-07-09 DIAGNOSIS — Z6841 Body Mass Index (BMI) 40.0 and over, adult: Secondary | ICD-10-CM | POA: Diagnosis not present

## 2019-07-09 DIAGNOSIS — R000 Tachycardia, unspecified: Secondary | ICD-10-CM | POA: Diagnosis not present

## 2019-07-09 DIAGNOSIS — R0902 Hypoxemia: Secondary | ICD-10-CM | POA: Diagnosis not present

## 2019-07-09 DIAGNOSIS — Z136 Encounter for screening for cardiovascular disorders: Secondary | ICD-10-CM | POA: Diagnosis not present

## 2019-07-09 DIAGNOSIS — Z79899 Other long term (current) drug therapy: Secondary | ICD-10-CM | POA: Diagnosis not present

## 2019-07-09 DIAGNOSIS — R0602 Shortness of breath: Secondary | ICD-10-CM | POA: Diagnosis not present

## 2019-07-09 DIAGNOSIS — J189 Pneumonia, unspecified organism: Secondary | ICD-10-CM | POA: Diagnosis present

## 2019-07-09 DIAGNOSIS — I4819 Other persistent atrial fibrillation: Secondary | ICD-10-CM | POA: Insufficient documentation

## 2019-07-09 DIAGNOSIS — I480 Paroxysmal atrial fibrillation: Secondary | ICD-10-CM | POA: Diagnosis present

## 2019-07-09 DIAGNOSIS — G4733 Obstructive sleep apnea (adult) (pediatric): Secondary | ICD-10-CM

## 2019-07-09 LAB — CBC WITH DIFFERENTIAL/PLATELET
Abs Immature Granulocytes: 0.04 10*3/uL (ref 0.00–0.07)
Basophils Absolute: 0 10*3/uL (ref 0.0–0.1)
Basophils Relative: 0 %
Eosinophils Absolute: 0.1 10*3/uL (ref 0.0–0.5)
Eosinophils Relative: 1 %
HCT: 40.2 % (ref 36.0–46.0)
Hemoglobin: 12 g/dL (ref 12.0–15.0)
Immature Granulocytes: 0 %
Lymphocytes Relative: 11 %
Lymphs Abs: 1.2 10*3/uL (ref 0.7–4.0)
MCH: 27.3 pg (ref 26.0–34.0)
MCHC: 29.9 g/dL — ABNORMAL LOW (ref 30.0–36.0)
MCV: 91.4 fL (ref 80.0–100.0)
Monocytes Absolute: 0.5 10*3/uL (ref 0.1–1.0)
Monocytes Relative: 5 %
Neutro Abs: 9.3 10*3/uL — ABNORMAL HIGH (ref 1.7–7.7)
Neutrophils Relative %: 83 %
Platelets: 253 10*3/uL (ref 150–400)
RBC: 4.4 MIL/uL (ref 3.87–5.11)
RDW: 14.4 % (ref 11.5–15.5)
WBC: 11.3 10*3/uL — ABNORMAL HIGH (ref 4.0–10.5)
nRBC: 0 % (ref 0.0–0.2)

## 2019-07-09 LAB — BASIC METABOLIC PANEL
Anion gap: 10 (ref 5–15)
BUN: 8 mg/dL (ref 8–23)
CO2: 32 mmol/L (ref 22–32)
Calcium: 9.6 mg/dL (ref 8.9–10.3)
Chloride: 100 mmol/L (ref 98–111)
Creatinine, Ser: 0.73 mg/dL (ref 0.44–1.00)
GFR calc Af Amer: >60 mL/min (ref >60)
GFR calc non Af Amer: >60 mL/min (ref >60)
Glucose, Bld: 112 mg/dL — ABNORMAL HIGH (ref 70–99)
Potassium: 4.5 mmol/L (ref 3.5–5.1)
Sodium: 142 mmol/L (ref 135–145)

## 2019-07-09 LAB — TSH: TSH: 1.908 u[IU]/mL (ref 0.350–4.500)

## 2019-07-09 LAB — BRAIN NATRIURETIC PEPTIDE: B Natriuretic Peptide: 281 pg/mL — ABNORMAL HIGH (ref 0.0–100.0)

## 2019-07-09 LAB — SARS CORONAVIRUS 2 BY RT PCR (HOSPITAL ORDER, PERFORMED IN ~~LOC~~ HOSPITAL LAB): SARS Coronavirus 2: NEGATIVE

## 2019-07-09 LAB — MAGNESIUM: Magnesium: 2.1 mg/dL (ref 1.7–2.4)

## 2019-07-09 MED ORDER — ALBUTEROL SULFATE (2.5 MG/3ML) 0.083% IN NEBU: 2.5000 mg | INHALATION_SOLUTION | RESPIRATORY_TRACT | Status: DC | PRN

## 2019-07-09 MED ORDER — IOHEXOL 350 MG/ML SOLN
100.0000 mL | Freq: Once | INTRAVENOUS | Status: AC | PRN
  Administered 2019-07-09: 100 mL via INTRAVENOUS

## 2019-07-09 MED ORDER — DILTIAZEM HCL ER COATED BEADS 120 MG PO CP24: 120.0000 mg | ORAL_CAPSULE | Freq: Every morning | ORAL | Status: DC

## 2019-07-09 MED ORDER — AEROCHAMBER PLUS FLO-VU MEDIUM MISC
1.0000 | Freq: Once | Status: AC
  Administered 2019-07-09: 1
  Filled 2019-07-09: qty 1

## 2019-07-09 MED ORDER — ACETAMINOPHEN 325 MG PO TABS: 650.0000 mg | ORAL_TABLET | Freq: Four times a day (QID) | ORAL | Status: DC | PRN

## 2019-07-09 MED ORDER — ONDANSETRON HCL 4 MG PO TABS: 4.0000 mg | ORAL_TABLET | Freq: Four times a day (QID) | ORAL | Status: DC | PRN

## 2019-07-09 MED ORDER — IPRATROPIUM-ALBUTEROL 0.5-2.5 (3) MG/3ML IN SOLN: 3.0000 mL | Freq: Once | RESPIRATORY_TRACT | Status: DC

## 2019-07-09 MED ORDER — APIXABAN 5 MG PO TABS
5.0000 mg | ORAL_TABLET | Freq: Two times a day (BID) | ORAL | Status: DC
  Administered 2019-07-09 – 2019-07-10 (×3): 5 mg via ORAL
  Filled 2019-07-09 (×4): qty 1

## 2019-07-09 MED ORDER — POLYETHYLENE GLYCOL 3350 17 G PO PACK: 17.0000 g | PACK | Freq: Every day | ORAL | Status: DC | PRN

## 2019-07-09 MED ORDER — ACETAMINOPHEN 650 MG RE SUPP: 650.0000 mg | Freq: Four times a day (QID) | RECTAL | Status: DC | PRN

## 2019-07-09 MED ORDER — IPRATROPIUM-ALBUTEROL 0.5-2.5 (3) MG/3ML IN SOLN: 3.0000 mL | Freq: Four times a day (QID) | RESPIRATORY_TRACT | Status: DC

## 2019-07-09 MED ORDER — METHYLPREDNISOLONE SODIUM SUCC 125 MG IJ SOLR
80.0000 mg | Freq: Two times a day (BID) | INTRAMUSCULAR | Status: DC
  Administered 2019-07-09 – 2019-07-10 (×2): 80 mg via INTRAVENOUS
  Filled 2019-07-09 (×2): qty 2

## 2019-07-09 MED ORDER — ONDANSETRON HCL 4 MG/2ML IJ SOLN: 4.0000 mg | Freq: Four times a day (QID) | INTRAMUSCULAR | Status: DC | PRN

## 2019-07-09 MED ORDER — ALBUTEROL SULFATE (2.5 MG/3ML) 0.083% IN NEBU
2.5000 mg | INHALATION_SOLUTION | Freq: Four times a day (QID) | RESPIRATORY_TRACT | Status: DC
  Administered 2019-07-09 – 2019-07-10 (×2): 2.5 mg via RESPIRATORY_TRACT
  Filled 2019-07-09 (×2): qty 3

## 2019-07-09 MED ORDER — ALBUTEROL SULFATE HFA 108 (90 BASE) MCG/ACT IN AERS
4.0000 | INHALATION_SPRAY | Freq: Once | RESPIRATORY_TRACT | Status: AC
Start: 2019-07-09 — End: 2019-07-09
  Administered 2019-07-09: 4 via RESPIRATORY_TRACT
  Filled 2019-07-09: qty 6.7

## 2019-07-09 NOTE — ED Notes (Signed)
Pt 96% on 1 L/M, turned off O2 and will reassess.  Pt states she feels better since inhaler.

## 2019-07-09 NOTE — ED Notes (Signed)
Pt to CT

## 2019-07-09 NOTE — H&P (Addendum)
History and Physical    Carolyn Silva [id number] DOB: 15-Jul-1950 DOA: 07/09/2019  PCP: Celene Squibb, MD   Patient coming from: Home  I have personally briefly reviewed patient's old medical records in [place]  Chief Complaint: Dizziness, palpitations  HPI: Carolyn Silva is a 69 y.o. female with medical history significant for asthma and hypertension, new diagnosis of atrial fibrillation. Patient presented to the ED with complaints of difficulty breathing with exertion, palpitation and dizziness also with exertion, that improves with rest.  She reports wheezing also.  No cough, no leg swelling, no chest pain.  She is not on fluid pills. Patient was diagnosed with atrial fibrillation with RVR about 3 weeks ago, she was placed started on Cardizem and Eliquis a week ago, she reports daily compliance with both, including today.  She is not on home O2.  ED Course: Temperature 98.1, heart rate 70s to 90s, intermittent tachypnea, blood pressure systolic mostly [id number] to [id number], but down to low 107s on my evaluation, O2 sats 87% on room air improved, but with ambulation sats were 86 to 88%.  BNP mildly elevated 281.  WBC 11.3.  Chest CTA negative for pulmonary embolism, with thoughts airspace disease effusion or pneumothorax.  Extensive thoracic spondylosis. Covid test pending, albuterol inhaler given in ED.  Hospitalist to admit for acute respiratory failure.  Review of Systems: As per HPI all other systems reviewed and negative.  Past Medical History:  Diagnosis Date  . Asthma   . Hypertension     Past Surgical History:  Procedure Laterality Date  . COLONOSCOPY N/A 09/14/2017   Procedure: COLONOSCOPY;  Surgeon: Danie Binder, MD;  [facility];  Service: Endoscopy;  Laterality: N/A;  2:00  . TUBAL LIGATION       reports that she has never smoked. She has never used smokeless tobacco. She reports current alcohol use. She reports that she does not use  drugs.  Allergies  Allergen Reactions  . Strawberry C [Ascorbic Acid] Hives    Family History  Problem Relation Age of Onset  . Diabetes Mother   . Cirrhosis Father   . Diabetes Brother   . Kidney disease Sister   . Colon cancer Neg Hx   . Colon polyps Neg Hx     Prior to Admission medications   Medication Sig Start Date End Date Taking? Authorizing Provider  diltiazem (CARDIZEM CD) 120 MG 24 hr capsule Take 120 mg by mouth every morning. 07/02/19  Yes [provider]  ELIQUIS 5 MG TABS tablet Take 5 mg by mouth 2 (two) times daily. 07/02/19  Yes [provider]  lisinopril (ZESTRIL) 10 MG tablet Take 10 mg by mouth daily. 06/03/19  Yes [provider]  albuterol (PROVENTIL HFA;VENTOLIN HFA) 108 (90 BASE) MCG/ACT inhaler Inhale 1-2 puffs into the lungs every 6 (six) hours as needed for wheezing or shortness of breath.     [provider]    Physical Exam: Vitals:   07/09/19 1500 07/09/19 1545 07/09/19 1630 07/09/19 1640  BP:   132/87   Pulse: 89 95 86 98  Resp: (!) 21 (!) 21 (!) 27 (!) 28  Temp:      TempSrc:      SpO2: 96% 94% 94% 97%  Weight:      Height:        Constitutional: Obese, calm, comfortable Vitals:   07/09/19 1500 07/09/19 1545 07/09/19 1630 07/09/19 1640  BP:   132/87  Pulse: 89 95 86 98  Resp: (!) 21 (!) 21 (!) 27 (!) 28  Temp:      TempSrc:      SpO2: 96% 94% 94% 97%  Weight:      Height:       Eyes: PERRL, lids and conjunctivae normal ENMT: Mucous membranes are moist. Posterior pharynx clear of any exudate or lesions.Normal dentition.  Neck: normal, supple, no masses, no thyromegaly Respiratory: Faint diffuse expiratory wheezing, no crackles. Normal respiratory effort. No accessory muscle use.  Cardiovascular: Becomes tachycardic with minimal exertion, irregular rate and rhythm, no murmurs / rubs / gallops. No extremity edema. 2+ pedal pulses.  Abdomen: no tenderness, no masses palpated. No hepatosplenomegaly.  Bowel sounds positive.  Musculoskeletal: no clubbing / cyanosis. No joint deformity upper and lower extremities. Good ROM, no contractures. Normal muscle tone.  Skin: no rashes, lesions, ulcers. No induration Neurologic: No apparent cranial nerve abnormality, moving all extremities spontaneously. Psychiatric: Normal judgment and insight. Alert and oriented x 3. Normal mood.   Labs on Admission: I have personally reviewed following labs and imaging studies  CBC: Recent Labs  Lab 07/09/19 1253  WBC 11.3*  NEUTROABS 9.3*  HGB 12.0  HCT 40.2  MCV 91.4  PLT 123456   Basic Metabolic Panel: Recent Labs  Lab 07/09/19 1253  NA 142  K 4.5  CL 100  CO2 32  GLUCOSE 112*  BUN 8  CREATININE 0.73  CALCIUM 9.6    Radiological Exams on Admission: CT Angio Chest PE W and/or Wo Contrast  Result Date: 07/09/2019 CLINICAL DATA:  Shortness of breath, atrial fibrillation with rapid ventricular response intermittently for 3 weeks, dizziness EXAM: CT ANGIOGRAPHY CHEST WITH CONTRAST TECHNIQUE: Multidetector CT imaging of the chest was performed using the standard protocol during bolus administration of intravenous contrast. Multiplanar CT image reconstructions and MIPs were obtained to evaluate the vascular anatomy. CONTRAST:  14mL OMNIPAQUE IOHEXOL 350 MG/ML SOLN COMPARISON:  07/09/2019 FINDINGS: Cardiovascular: This is a technically adequate evaluation of the pulmonary vasculature. No filling defects or pulmonary emboli. The heart is mildly enlarged. No pericardial effusion. Thoracic aorta is normal in caliber without evidence of dissection. Mediastinum/Nodes: No enlarged mediastinal, hilar, or axillary lymph nodes. Thyroid gland, trachea, and esophagus demonstrate no significant findings. Lungs/Pleura: Hypoventilatory changes are seen within the left lower lobe. No airspace disease, effusion, or pneumothorax. The central airways are patent. Upper Abdomen: No acute abnormality. Musculoskeletal: No acute or  destructive bony lesions. There is prominent spondylosis at T12/L1 with left paracentral disc osteophyte complex resulting in at least mild central canal stenosis. Diffuse bridging anterior osteophytes throughout the entirety of the visualized thoracic spine. Significant disc space narrowing throughout the mid to upper thoracic spine. Reconstructed images demonstrate no additional findings. Review of the MIP images confirms the above findings. IMPRESSION: 1. No evidence of pulmonary embolus. 2. Extensive thoracic spondylosis. Electronically Signed   By: Randa Ngo M.D.   On: 07/09/2019 15:24   DG Chest Portable 1 View  Result Date: 07/09/2019 CLINICAL DATA:  Shortness of breath for 3 weeks. Atrial fibrillation. EXAM: PORTABLE CHEST 1 VIEW COMPARISON:  07/01/2019 FINDINGS: The cardiac silhouette remains mildly enlarged, and the central pulmonary arteries remain moderately enlarged. There is mild scarring or atelectasis in the left lower lung. No edema, sizable pleural effusion, or pneumothorax is identified. No acute osseous abnormality is seen. IMPRESSION: 1. Mild scarring or atelectasis in the left lower lung. No evidence of pneumonia or edema. 2. Cardiomegaly and pulmonary arterial enlargement.  Electronically Signed   By: Logan Bores M.D.   On: 07/09/2019 13:25    EKG: Independently reviewed.  Atria fibrillation rate 88.  QTc 461. Compared to EKG 2015 that was sinus rhythm.  Otherwise no significant changes.  Assessment/Plan Principal Problem:   Asthma exacerbation Active Problems:   Atrial fibrillation (HCC)   Essential hypertension   Asthma exacerbation with acute hypoxic respiratory failure-dyspnea, wheezing, O2 sats 86 - 88% with ambulation.  Currently on 2 L nasal cannula.  CTA chest negative for PE, or other acute pulmonary process.  Completed Covid vaccine, second dose was in April.  Covid PCR test negative. -IV Solu-Medrol 80 every 12 hourly -Albuterol nebs scheduled X 4,  PRN. -Supplemental O2 -Peak flow meter  Atrial fibrillation-newly diagnosed 3 weeks ago, started on Cardizem and Eliquis about a week ago.  Reports compliance.  Has not had echocardiogram done.  Heart rate mostly 80s to 90s. -Obtain echocardiogram -Check TSH, magnesium -Resume Cardizem 120 mg daily, may need dose increase pending clinical course/improvement in respiratory status. - Patient was to follow-up with cardiology, appointment was set for 6/25, will consult cardiology inpatient.  Hypertension-mostly stable, but down to 107 on my evaluation. -Hold home lisinopril 10 mg   DVT prophylaxis: Eliquis Code Status: Full code Family Communication: None at bedside Disposition Plan: 1 - 2 days Consults called: Cardiology Admission status: Observation, telemetry   Bethena Roys MD [facility]  07/09/2019, 5:20 PM

## 2019-07-09 NOTE — ED Provider Notes (Signed)
[facility] EMERGENCY DEPARTMENT Provider Note   CSN: [id number] Arrival date & time: 07/09/19  1146     History Chief Complaint  Patient presents with  . Tachycardia    Carolyn Silva is a 69 y.o. female with a past medical history significant for asthma and hypertension who presents to the ED from PCP Dr. Nevada Crane due to shortness of breath with exertion and dizziness.  Patient was recently diagnosed with paroxysmal A. fib with RVR 3 weeks ago. She was placed on Eliquis and Diltiazem last week which she has been compliant with. Patient notes last night she felt "real bad all over" associated with shortness of breath and dizziness. Patient states she felt both off balance and felt like the room was spinning around her. She notes her symptoms have completely resolved at this point. Denies chest pain, cough, and lower extremity edema. Denies tobacco abuse. She does admit to intermittent wheezing in which she uses her inhaler with relief. Patient has a scheduled cardiology appointment on 08/09/19.   History obtained from patient and past medical records. No interpreter used during encounter.      Past Medical History:  Diagnosis Date  . Asthma   . Hypertension     Patient Active Problem List   Diagnosis Date Noted  . Asthma exacerbation 07/09/2019  . Special screening for malignant neoplasms, colon     Past Surgical History:  Procedure Laterality Date  . COLONOSCOPY N/A 09/14/2017   Procedure: COLONOSCOPY;  Surgeon: Danie Binder, MD;  Location: [facility];  Service: Endoscopy;  Laterality: N/A;  2:00  . TUBAL LIGATION       OB History    Gravida      Para      Term      Preterm      AB      Living  5     SAB      TAB      Ectopic      Multiple      Live Births              Family History  Problem Relation Age of Onset  . Diabetes Mother   . Cirrhosis Father   . Diabetes Brother   . Kidney disease Sister   . Colon cancer Neg Hx   . Colon  polyps Neg Hx     Social History   Tobacco Use  . Smoking status: Never Smoker  . Smokeless tobacco: Never Used  Substance Use Topics  . Alcohol use: Yes    Comment: Occasional  . Drug use: No    Home Medications Prior to Admission medications   Medication Sig Start Date End Date Taking? Authorizing Provider  diltiazem (CARDIZEM CD) 120 MG 24 hr capsule Take 120 mg by mouth every morning. 07/02/19  Yes [provider]  ELIQUIS 5 MG TABS tablet Take 5 mg by mouth 2 (two) times daily. 07/02/19  Yes [provider]  lisinopril (ZESTRIL) 10 MG tablet Take 10 mg by mouth daily. 06/03/19  Yes [provider]  albuterol (PROVENTIL HFA;VENTOLIN HFA) 108 (90 BASE) MCG/ACT inhaler Inhale 1-2 puffs into the lungs every 6 (six) hours as needed for wheezing or shortness of breath.     [provider]    Allergies    Strawberry c [ascorbic acid]  Review of Systems   Review of Systems  Constitutional: Negative for chills and fever.  Respiratory: Positive for shortness of breath (resolved). Negative  for cough.   Cardiovascular: Negative for chest pain and leg swelling.  Gastrointestinal: Negative for abdominal pain, diarrhea, nausea and vomiting.  Neurological: Positive for dizziness (resolved).  All other systems reviewed and are negative.   Physical Exam Updated Vital Signs BP 133/75   Pulse 95   Temp 98.1 F (36.7 C) (Oral)   Resp (!) 21   Ht 5\' 6"  (1.676 m)   Wt 130.6 kg   SpO2 94%   BMI 46.48 kg/m   Physical Exam Vitals and nursing note reviewed.  Constitutional:      General: She is not in acute distress.    Appearance: She is not ill-appearing.  HENT:     Head: Normocephalic.  Eyes:     Pupils: Pupils are equal, round, and reactive to light.  Cardiovascular:     Rate and Rhythm: Normal rate. Rhythm irregular.     Pulses: Normal pulses.     Heart sounds: Normal heart sounds. No murmur. No friction rub. No gallop.   Pulmonary:      Effort: Pulmonary effort is normal.     Breath sounds: Wheezing present.  Abdominal:     General: Abdomen is flat. Bowel sounds are normal. There is no distension.     Palpations: Abdomen is soft.     Tenderness: There is no abdominal tenderness. There is no guarding or rebound.  Musculoskeletal:     Cervical back: Neck supple.     Comments: Able to move all 4 extremities without difficulty. No lower extremity edema.   Skin:    General: Skin is warm and dry.  Neurological:     General: No focal deficit present.     Mental Status: She is alert.  Psychiatric:        Mood and Affect: Mood normal.        Behavior: Behavior normal.     ED Results / Procedures / Treatments   Labs (all labs ordered are listed, but only abnormal results are displayed) Labs Reviewed  CBC WITH DIFFERENTIAL/PLATELET - Abnormal; Notable for the following components:      Result Value   WBC 11.3 (*)    MCHC 29.9 (*)    Neutro Abs 9.3 (*)    All other components within normal limits  BASIC METABOLIC PANEL - Abnormal; Notable for the following components:   Glucose, Bld 112 (*)    All other components within normal limits  BRAIN NATRIURETIC PEPTIDE - Abnormal; Notable for the following components:   B Natriuretic Peptide 281.0 (*)    All other components within normal limits  SARS CORONAVIRUS 2 BY RT PCR [facility] ORDER, [place] LAB)    EKG EKG Interpretation  Date/Time:  Tuesday Jul 09 2019 12:06:51 EDT Ventricular Rate:  88 PR Interval:    QRS Duration: 93 QT Interval:  381 QTC Calculation: 461 R Axis:   21 Text Interpretation: Atrial fibrillation Low voltage, precordial leads Probable anteroseptal infarct, old Baseline wander in lead(s) III Confirmed by Elnora Morrison [phone]) on 07/09/2019 1:19:38 PM   Radiology CT Angio Chest PE W and/or Wo Contrast  Result Date: 07/09/2019 CLINICAL DATA:  Shortness of breath, atrial fibrillation with rapid ventricular response  intermittently for 3 weeks, dizziness EXAM: CT ANGIOGRAPHY CHEST WITH CONTRAST TECHNIQUE: Multidetector CT imaging of the chest was performed using the standard protocol during bolus administration of intravenous contrast. Multiplanar CT image reconstructions and MIPs were obtained to evaluate the vascular anatomy. CONTRAST:  189mL OMNIPAQUE IOHEXOL 350  MG/ML SOLN COMPARISON:  07/09/2019 FINDINGS: Cardiovascular: This is a technically adequate evaluation of the pulmonary vasculature. No filling defects or pulmonary emboli. The heart is mildly enlarged. No pericardial effusion. Thoracic aorta is normal in caliber without evidence of dissection. Mediastinum/Nodes: No enlarged mediastinal, hilar, or axillary lymph nodes. Thyroid gland, trachea, and esophagus demonstrate no significant findings. Lungs/Pleura: Hypoventilatory changes are seen within the left lower lobe. No airspace disease, effusion, or pneumothorax. The central airways are patent. Upper Abdomen: No acute abnormality. Musculoskeletal: No acute or destructive bony lesions. There is prominent spondylosis at T12/L1 with left paracentral disc osteophyte complex resulting in at least mild central canal stenosis. Diffuse bridging anterior osteophytes throughout the entirety of the visualized thoracic spine. Significant disc space narrowing throughout the mid to upper thoracic spine. Reconstructed images demonstrate no additional findings. Review of the MIP images confirms the above findings. IMPRESSION: 1. No evidence of pulmonary embolus. 2. Extensive thoracic spondylosis. Electronically Signed   By: Randa Ngo M.D.   On: 07/09/2019 15:24   DG Chest Portable 1 View  Result Date: 07/09/2019 CLINICAL DATA:  Shortness of breath for 3 weeks. Atrial fibrillation. EXAM: PORTABLE CHEST 1 VIEW COMPARISON:  07/01/2019 FINDINGS: The cardiac silhouette remains mildly enlarged, and the central pulmonary arteries remain moderately enlarged. There is mild scarring  or atelectasis in the left lower lung. No edema, sizable pleural effusion, or pneumothorax is identified. No acute osseous abnormality is seen. IMPRESSION: 1. Mild scarring or atelectasis in the left lower lung. No evidence of pneumonia or edema. 2. Cardiomegaly and pulmonary arterial enlargement. Electronically Signed   By: Logan Bores M.D.   On: 07/09/2019 13:25    Procedures Procedures (including critical care time)  Medications Ordered in ED Medications  albuterol (VENTOLIN HFA) 108 (90 Base) MCG/ACT inhaler 4 puff (4 puffs Inhalation Given 07/09/19 1253)  AeroChamber Plus Flo-Vu Medium MISC 1 each (1 each Other Given 07/09/19 1301)  iohexol (OMNIPAQUE) 350 MG/ML injection 100 mL (100 mLs Intravenous Contrast Given 07/09/19 1507)    ED Course  I have reviewed the triage vital signs and the nursing notes.  Pertinent labs & imaging results that were available during my care of the patient were reviewed by me and considered in my medical decision making (see chart for details).  Clinical Course as of Jul 09 1610  Tue Jul 09, 2019  1306 B Natriuretic Peptide(!): 281.0 [CA]  1550 Spoke to Dr. Denton Brick with [facility] who agrees to admit patient for further evaluation.    [CA]    Clinical Course User Index [CA] Suzy Bouchard, PA-C   MDM Rules/Calculators/A&P                     69 year old female presents to the ED due to shortness of breath and dizziness last night that has completely resolved. Patient just recently diagnosed with paroxysmal A. fib with RVR and placed on Eliquis and diltiazem last week.  Patient notes she feels much better today with no physical complaints.  Patient has a history of asthma.  Upon arrival, patient found to be hypoxic to 87% and placed on 2 L nasal cannula. She is not normally on oxygen at home. Patient also tachypneic at 22. Otherwise reassuring vitals. Patient in no acute distress and non-toxic appearing. Lungs with mild expiratory wheeze. No lower extremity  edema. Will obtain routine labs and CXR to rule out infectious etiology. Suspect symptoms related to A. Fib vs. Asthma exacerbation given wheezing on physical exam  vs. undiagnosed CHF vs. PE.  Patient ambulated on RA with O2 saturation between 86-88%. Will obtain CTA to rule out PE given new oxygen requirement. CXR personally reviewed which demonstrates:   IMPRESSION:  1. Mild scarring or atelectasis in the left lower lung. No evidence  of pneumonia or edema.  2. Cardiomegaly and pulmonary arterial enlargement.   CBC significant for mild leukocytosis at 11.3, but otherwise reassuring.  BNP elevated at 281.  EKG personally reviewed which demonstrates A. Fib with no signs of acute ischemia. BMP reassuring with normal renal function and no major electrolyte derangements.  CTA personally reviewed which demonstrates: IMPRESSION:  1. No evidence of pulmonary embolus.  2. Extensive thoracic spondylosis.   Given patient is still requiring oxygen. Will consult hospitalist for medical admission.  Discussed case with Dr. Sidney Ace with [facility] who agrees to admit patient for further treatment. COVID test pending.  Final Clinical Impression(s) / ED Diagnoses Final diagnoses:  Hypoxia    Rx / DC Orders ED Discharge Orders    None       Karie Kirks 07/09/19 1613    Elnora Morrison, MD 07/11/19 [phone]

## 2019-07-09 NOTE — ED Notes (Signed)
Attempted to call report, nurse stated she will call back for report

## 2019-07-09 NOTE — ED Triage Notes (Signed)
PT sent to ED today for eval and treatment due to new onset of Afib RVR intermittently x3 weeks. PCP Dr. Nevada Crane reports pt was started on eliquis and diltiazem last week but had an office visit this am and was still complaining of SOB on exertion and dizziness with high heart rate. PCP states cardiology office appointment was made for pt but couldn't be seen in the office until 08/09/19.

## 2019-07-09 NOTE — Progress Notes (Addendum)
Called by patient's nurse, patient running pauses, 1 lasting 5 seconds, another 6.28 seconds.  She last took her Cardizem 120 mg daily this morning.  I have discontinued further dosing for now.  She will be evaluated by cardiology in the morning.  Arlyce Dice, MD. [facility]. 9:46 PM 07/09/19

## 2019-07-09 NOTE — Progress Notes (Signed)
Notified by central telemetry that patient is running pauses.  Has had one of 5 seconds and another of 6.28 seconds.  On-call MD notified.  Will continue to monitor.

## 2019-07-09 NOTE — ED Notes (Signed)
Pt called requesting her oxygen turn back on,  RA sats were 92-91%.  Neb tx given and O2 at 1 L/M currently.  Will continue to monitor.

## 2019-07-09 NOTE — ED Notes (Signed)
Ambulated with client down hallway and back, as well as to restroom. Patient O2 sats on room air maintained between 86-88%. Client noted with shortness of breath, tachycardia, and patient reports of dizziness by end of ambulation. RN and PA notified.

## 2019-07-10 ENCOUNTER — Observation Stay (HOSPITAL_BASED_OUTPATIENT_CLINIC_OR_DEPARTMENT_OTHER): Payer: Medicare Other

## 2019-07-10 ENCOUNTER — Other Ambulatory Visit (HOSPITAL_COMMUNITY): Payer: Self-pay | Admitting: Cardiology

## 2019-07-10 DIAGNOSIS — J45901 Unspecified asthma with (acute) exacerbation: Secondary | ICD-10-CM

## 2019-07-10 DIAGNOSIS — G4733 Obstructive sleep apnea (adult) (pediatric): Secondary | ICD-10-CM | POA: Diagnosis not present

## 2019-07-10 DIAGNOSIS — I10 Essential (primary) hypertension: Secondary | ICD-10-CM | POA: Diagnosis not present

## 2019-07-10 DIAGNOSIS — E662 Morbid (severe) obesity with alveolar hypoventilation: Secondary | ICD-10-CM | POA: Diagnosis not present

## 2019-07-10 DIAGNOSIS — Z6841 Body Mass Index (BMI) 40.0 and over, adult: Secondary | ICD-10-CM | POA: Diagnosis not present

## 2019-07-10 DIAGNOSIS — J45909 Unspecified asthma, uncomplicated: Secondary | ICD-10-CM | POA: Diagnosis not present

## 2019-07-10 DIAGNOSIS — R0600 Dyspnea, unspecified: Secondary | ICD-10-CM | POA: Diagnosis not present

## 2019-07-10 DIAGNOSIS — I4891 Unspecified atrial fibrillation: Secondary | ICD-10-CM | POA: Diagnosis not present

## 2019-07-10 DIAGNOSIS — I4819 Other persistent atrial fibrillation: Secondary | ICD-10-CM | POA: Diagnosis not present

## 2019-07-10 DIAGNOSIS — Z841 Family history of disorders of kidney and ureter: Secondary | ICD-10-CM | POA: Diagnosis not present

## 2019-07-10 DIAGNOSIS — Z825 Family history of asthma and other chronic lower respiratory diseases: Secondary | ICD-10-CM | POA: Diagnosis not present

## 2019-07-10 DIAGNOSIS — E872 Acidosis: Secondary | ICD-10-CM | POA: Diagnosis not present

## 2019-07-10 DIAGNOSIS — I517 Cardiomegaly: Secondary | ICD-10-CM | POA: Diagnosis not present

## 2019-07-10 DIAGNOSIS — J4521 Mild intermittent asthma with (acute) exacerbation: Secondary | ICD-10-CM | POA: Diagnosis not present

## 2019-07-10 DIAGNOSIS — J9621 Acute and chronic respiratory failure with hypoxia: Secondary | ICD-10-CM | POA: Diagnosis not present

## 2019-07-10 DIAGNOSIS — Z833 Family history of diabetes mellitus: Secondary | ICD-10-CM | POA: Diagnosis not present

## 2019-07-10 DIAGNOSIS — Z20822 Contact with and (suspected) exposure to covid-19: Secondary | ICD-10-CM | POA: Diagnosis not present

## 2019-07-10 DIAGNOSIS — I501 Left ventricular failure: Secondary | ICD-10-CM | POA: Diagnosis not present

## 2019-07-10 DIAGNOSIS — J449 Chronic obstructive pulmonary disease, unspecified: Secondary | ICD-10-CM | POA: Diagnosis not present

## 2019-07-10 DIAGNOSIS — J9622 Acute and chronic respiratory failure with hypercapnia: Secondary | ICD-10-CM | POA: Diagnosis not present

## 2019-07-10 DIAGNOSIS — Z7901 Long term (current) use of anticoagulants: Secondary | ICD-10-CM | POA: Diagnosis not present

## 2019-07-10 DIAGNOSIS — R0902 Hypoxemia: Secondary | ICD-10-CM

## 2019-07-10 DIAGNOSIS — K219 Gastro-esophageal reflux disease without esophagitis: Secondary | ICD-10-CM | POA: Diagnosis not present

## 2019-07-10 DIAGNOSIS — J9602 Acute respiratory failure with hypercapnia: Secondary | ICD-10-CM | POA: Diagnosis not present

## 2019-07-10 DIAGNOSIS — D72829 Elevated white blood cell count, unspecified: Secondary | ICD-10-CM | POA: Diagnosis not present

## 2019-07-10 DIAGNOSIS — I110 Hypertensive heart disease with heart failure: Secondary | ICD-10-CM | POA: Diagnosis not present

## 2019-07-10 DIAGNOSIS — I361 Nonrheumatic tricuspid (valve) insufficiency: Secondary | ICD-10-CM | POA: Diagnosis not present

## 2019-07-10 DIAGNOSIS — I4892 Unspecified atrial flutter: Secondary | ICD-10-CM | POA: Diagnosis not present

## 2019-07-10 LAB — ECHOCARDIOGRAM COMPLETE
Height: 66 in
Weight: 4638.48 oz

## 2019-07-10 LAB — HIV ANTIBODY (ROUTINE TESTING W REFLEX): HIV Screen 4th Generation wRfx: NONREACTIVE

## 2019-07-10 MED ORDER — LEVALBUTEROL HCL 0.63 MG/3ML IN NEBU
0.6300 mg | INHALATION_SOLUTION | Freq: Four times a day (QID) | RESPIRATORY_TRACT | Status: DC | PRN
  Administered 2019-07-11: 0.63 mg via RESPIRATORY_TRACT
  Filled 2019-07-10 (×2): qty 3

## 2019-07-10 MED ORDER — METHYLPREDNISOLONE SODIUM SUCC 125 MG IJ SOLR
60.0000 mg | Freq: Two times a day (BID) | INTRAMUSCULAR | Status: DC
  Administered 2019-07-10 – 2019-07-11 (×2): 60 mg via INTRAVENOUS
  Filled 2019-07-10 (×2): qty 2

## 2019-07-10 NOTE — Progress Notes (Signed)
*  PRELIMINARY RESULTS* Echocardiogram 2D Echocardiogram has been performed.  Carolyn Silva 07/10/2019, 8:25 AM

## 2019-07-10 NOTE — Anesthesia Preprocedure Evaluation (Signed)
Anesthesia Evaluation  Patient identified by MRN, date of birth, ID band Patient awake    Reviewed: Allergy & Precautions, NPO status , Patient's Chart, lab work & pertinent test results  History of Anesthesia Complications (+) history of anesthetic complications  Airway Mallampati: III  TM Distance: >3 FB Neck ROM: Full    Dental  (+) Edentulous Upper, Edentulous Lower   Pulmonary shortness of breath and with exertion, asthma ,    Pulmonary exam normal breath sounds clear to auscultation       Cardiovascular Exercise Tolerance: Poor hypertension, Pt. on medications + dysrhythmias Atrial Fibrillation  Rhythm:Irregular Rate:Abnormal - Systolic murmurs, - Diastolic murmurs, - Carotid Bruit, - Peripheral Edema and - Systolic Click [id number] [facility] ROUTINE RECORD Atrial fibrillation Low voltage, precordial leads Probable anteroseptal infarct, old Baseline wander in lead(s) III Confirmed by Elnora Morrison [id number]) on 07/09/2019 1:19:38 PM  1. Left ventricular ejection fraction, by estimation, is 70 to 75%. The  left ventricle has hyperdynamic function. The left ventricle has no  regional wall motion abnormalities. There is moderate left ventricular  hypertrophy. Left ventricular diastolic  parameters are indeterminate.  2. Right ventricular systolic function is normal. The right ventricular  size is normal. Tricuspid regurgitation signal is inadequate for assessing  PA pressure.  3. Right atrial size was upper normal.  4. The mitral valve is grossly normal. Trivial mitral valve  regurgitation.  5. The aortic valve is tricuspid. Aortic valve regurgitation is not  visualized.  6. The inferior vena cava is dilated in size with >50% respiratory  variability, suggesting right atrial pressure of 8 mmHg.    Neuro/Psych negative neurological ROS  negative psych ROS   GI/Hepatic negative GI  ROS, Neg liver ROS,   Endo/Other  Morbid obesity  Renal/GU negative Renal ROS     Musculoskeletal negative musculoskeletal ROS (+)   Abdominal   Peds  Hematology negative hematology ROS (+)   Anesthesia Other Findings   Reproductive/Obstetrics negative OB ROS                             Anesthesia Physical Anesthesia Plan  ASA: IV  Anesthesia Plan: General   Post-op Pain Management:    Induction: Intravenous  PONV Risk Score and Plan: 2 and TIVA  Airway Management Planned: Nasal Cannula, Simple Face Mask and Natural Airway  Additional Equipment:   Intra-op Plan:   Post-operative Plan:   Informed Consent: I have reviewed the patients History and Physical, chart, labs and discussed the procedure including the risks, benefits and alternatives for the proposed anesthesia with the patient or authorized representative who has indicated his/her understanding and acceptance.       Plan Discussed with: CRNA  Anesthesia Plan Comments:         Anesthesia Quick Evaluation

## 2019-07-10 NOTE — Consult Note (Addendum)
Cardiology Consult    Patient ID: Carolyn Silva; EH:[id number]; December 17, 1950   Admit date: 07/09/2019 Date of Consult: 07/10/2019  Primary Care Provider: Celene Squibb, MD Primary Cardiologist: New to Dr. Domenic Polite - was scheduled for a NP appointment with Dr. Harl Bowie in 07/2019  Patient Profile    Carolyn Silva is a 69 y.o. female with past medical history of HTN and asthma who is being seen today for the evaluation of new-onset atrial fibrillation and pauses at the request of Dr. Denton Brick.   History of Present Illness    Carolyn Silva presented to [facility] ED on 07/09/2019 for evaluation of worsening dyspnea on exertion for the past 3 weeks. She had been evaluated by her PCP last wek and diagnosed with atrial fibrillation, with Eliquis 5mg  BID and Cardizem CD 120mg  daily being initiated.   She reports having baseline dyspnea on exertion in the setting of her asthma but symptoms have acutely worsened over the past 2 to 3 weeks. She reports associated palpitations and dizziness. No associated chest pain, orthopnea, PND, or edema. She was going to the doctor's office last week and reports having a syncopal episode while getting into the car. Says she lost consciousness for a few seconds. She denies any known history of atrial fibrillation until her recent diagnosis. She does consume occasional coffee and a few beers per week but no consistent intake. No prior history of CAD or CHF. No known history of OSA but she has never undergone a sleep study. She is unaware of any family history of cardiac issues. Her daughter does work here at [employer] is a respiratory therapist.   Initial labs show WBC 11.3, Hgb 12.0, platelets 253, Na+ 142, K+ 4.5 and creatinine 0.73. BNP 281. TSH 1.908. Mg 2.1. COVID negative. EKG shows rate-controlled atrial fibrillation, HR 88 with baseline artifact and nonspecific IVCD. CXR showed mild scarring or atelectasis of the left lower lung and cardiomegaly with pulmonary  arterial enlargement. CTA showed no evidence of a PE but she was noted to have extensive thoracic spondylosis.   While in the ED, ambulatory oxygen saturations dropped into the mid-80's with ambulation, therefore she was admitted for an acute asthma exacerbation and started on IV steroids and scheduled nebulizer treatments. HR was variable in the ED, therefore she was continued on Cardizem CD 120mg  daily. Overnight, telemetry shows she was in atrial fibrillation with HR mostly in the 80's to 90's. She did have significant pauses around 2100 with a pause of 6.29 seconds followed by a 3.44 second pause. Additional pause occurred at 0300 that was 3.30 seconds. She believes she might have been asleep throughout all of these but is unsure.    Past Medical History:  Diagnosis Date  . Asthma   . Hypertension     Past Surgical History:  Procedure Laterality Date  . COLONOSCOPY N/A 09/14/2017   Procedure: COLONOSCOPY;  Surgeon: Danie Binder, MD;  Location: [facility];  Service: Endoscopy;  Laterality: N/A;  2:00  . TUBAL LIGATION       Home Medications:  Prior to Admission medications   Medication Sig Start Date End Date Taking? Authorizing Provider  diltiazem (CARDIZEM CD) 120 MG 24 hr capsule Take 120 mg by mouth every morning. 07/02/19  Yes [provider]  ELIQUIS 5 MG TABS tablet Take 5 mg by mouth 2 (two) times daily. 07/02/19  Yes [provider]  lisinopril (ZESTRIL) 10 MG tablet Take 10 mg by mouth  daily. 06/03/19  Yes [provider]  albuterol (PROVENTIL HFA;VENTOLIN HFA) 108 (90 BASE) MCG/ACT inhaler Inhale 1-2 puffs into the lungs every 6 (six) hours as needed for wheezing or shortness of breath.     [provider]    Inpatient Medications: Scheduled Meds: . albuterol  2.5 mg Nebulization Q6H  . apixaban  5 mg Oral BID  . ipratropium-albuterol  3 mL Nebulization Once  . methylPREDNISolone (SOLU-MEDROL) injection  80 mg Intravenous Q12H    Continuous Infusions:  PRN Meds: acetaminophen **OR** acetaminophen, albuterol, ondansetron **OR** ondansetron (ZOFRAN) IV, polyethylene glycol  Allergies:    Allergies  Allergen Reactions  . Strawberry C [Ascorbic Acid] Hives    Social History:   Social History   Socioeconomic History  . Marital status: Single    Spouse name: Not on file  . Number of children: Not on file  . Years of education: Not on file  . Highest education level: Not on file  Occupational History  . Not on file  Tobacco Use  . Smoking status: Never Smoker  . Smokeless tobacco: Never Used  Substance and Sexual Activity  . Alcohol use: Yes    Comment: Occasional  . Drug use: No  . Sexual activity: Not on file  Other Topics Concern  . Not on file  Social History Narrative  . Not on file   Social Determinants of Health   Financial Resource Strain:   . Difficulty of Paying Living Expenses:   Food Insecurity:   . Worried About Charity fundraiser in the Last Year:   . Arboriculturist in the Last Year:   Transportation Needs:   . Film/video editor (Medical):   Marland Kitchen Lack of Transportation (Non-Medical):   Physical Activity:   . Days of Exercise per Week:   . Minutes of Exercise per Session:   Stress:   . Feeling of Stress :   Social Connections:   . Frequency of Communication with Friends and Family:   . Frequency of Social Gatherings with Friends and Family:   . Attends Religious Services:   . Active Member of Clubs or Organizations:   . Attends Archivist Meetings:   Marland Kitchen Marital Status:   Intimate Partner Violence:   . Fear of Current or Ex-Partner:   . Emotionally Abused:   Marland Kitchen Physically Abused:   . Sexually Abused:      Family History:    Family History  Problem Relation Age of Onset  . Diabetes Mother   . Cirrhosis Father   . Diabetes Brother   . Kidney disease Sister   . Colon cancer Neg Hx   . Colon polyps Neg Hx       Review of Systems    General:  No  chills, fever, night sweats or weight changes.  Cardiovascular:  No chest pain, edema, orthopnea, palpitations, paroxysmal nocturnal dyspnea. Positive for palpitations and dyspnea on exertion.  Dermatological: No rash, lesions/masses Respiratory: No cough, dyspnea Urologic: No hematuria, dysuria Abdominal:   No nausea, vomiting, diarrhea, bright red blood per rectum, melena, or hematemesis Neurologic:  No visual changes, wkns, changes in mental status. Positive for dizziness.   All other systems reviewed and are otherwise negative except as noted above.  Physical Exam/Data    Vitals:   07/09/19 1920 07/09/19 2133 07/10/19 0106 07/10/19 0523  BP:  126/76 (!) 134/96 119/87  Pulse:  90 89 94  Resp:  20 20 20   Temp:  99.3  F (37.4 C) 98.5 F (36.9 C) 98.5 F (36.9 C)  TempSrc:  Oral Oral Oral  SpO2: 92% 96% 95% 96%  Weight:      Height:       No intake or output data in the 24 hours ending 07/10/19 0803 Filed Weights   07/09/19 1210 07/09/19 1822  Weight: 130.6 kg 131.5 kg   Body mass index is 46.79 kg/m.   General: Pleasant, obese female appearing in NAD Psych: Normal affect. Neuro: Alert and oriented X 3. Moves all extremities spontaneously. HEENT: Normal  Neck: Supple without bruits or JVD. Lungs:  Resp regular and unlabored, CTA without wheezing or rales. Heart: Irregularly irregular. No s3, s4, or murmurs. Abdomen: Soft, non-tender, non-distended, BS + x 4.  Extremities: No clubbing, cyanosis or lower extremity edema. DP/PT/Radials 2+ and equal bilaterally.  EKG:  The EKG was personally reviewed and demonstrates: Rate-controlled atrial fibrillation, HR 88 with baseline artifact and nonspecific IVCD.   Labs/Studies     Relevant CV Studies:  Echocardiogram: Pending  Laboratory Data:  Chemistry Recent Labs  Lab 07/09/19 1253  NA 142  K 4.5  CL 100  CO2 32  GLUCOSE 112*  BUN 8  CREATININE 0.73  CALCIUM 9.6  GFRNONAA >60  GFRAA >60  ANIONGAP 10      No results for input(s): PROT, ALBUMIN, AST, ALT, ALKPHOS, BILITOT in the last 168 hours. Hematology Recent Labs  Lab 07/09/19 1253  WBC 11.3*  RBC 4.40  HGB 12.0  HCT 40.2  MCV 91.4  MCH 27.3  MCHC 29.9*  RDW 14.4  PLT 253   Cardiac EnzymesNo results for input(s): TROPONINI in the last 168 hours. No results for input(s): TROPIPOC in the last 168 hours.  BNP Recent Labs  Lab 07/09/19 1253  BNP 281.0*    DDimer No results for input(s): DDIMER in the last 168 hours.  Radiology/Studies:  CT Angio Chest PE W and/or Wo Contrast  Result Date: 07/09/2019 CLINICAL DATA:  Shortness of breath, atrial fibrillation with rapid ventricular response intermittently for 3 weeks, dizziness EXAM: CT ANGIOGRAPHY CHEST WITH CONTRAST TECHNIQUE: Multidetector CT imaging of the chest was performed using the standard protocol during bolus administration of intravenous contrast. Multiplanar CT image reconstructions and MIPs were obtained to evaluate the vascular anatomy. CONTRAST:  126mL OMNIPAQUE IOHEXOL 350 MG/ML SOLN COMPARISON:  07/09/2019 FINDINGS: Cardiovascular: This is a technically adequate evaluation of the pulmonary vasculature. No filling defects or pulmonary emboli. The heart is mildly enlarged. No pericardial effusion. Thoracic aorta is normal in caliber without evidence of dissection. Mediastinum/Nodes: No enlarged mediastinal, hilar, or axillary lymph nodes. Thyroid gland, trachea, and esophagus demonstrate no significant findings. Lungs/Pleura: Hypoventilatory changes are seen within the left lower lobe. No airspace disease, effusion, or pneumothorax. The central airways are patent. Upper Abdomen: No acute abnormality. Musculoskeletal: No acute or destructive bony lesions. There is prominent spondylosis at T12/L1 with left paracentral disc osteophyte complex resulting in at least mild central canal stenosis. Diffuse bridging anterior osteophytes throughout the entirety of the visualized thoracic  spine. Significant disc space narrowing throughout the mid to upper thoracic spine. Reconstructed images demonstrate no additional findings. Review of the MIP images confirms the above findings. IMPRESSION: 1. No evidence of pulmonary embolus. 2. Extensive thoracic spondylosis. Electronically Signed   By: Randa Ngo M.D.   On: 07/09/2019 15:24   DG Chest Portable 1 View  Result Date: 07/09/2019 CLINICAL DATA:  Shortness of breath for 3 weeks. Atrial fibrillation. EXAM: PORTABLE CHEST  1 VIEW COMPARISON:  07/01/2019 FINDINGS: The cardiac silhouette remains mildly enlarged, and the central pulmonary arteries remain moderately enlarged. There is mild scarring or atelectasis in the left lower lung. No edema, sizable pleural effusion, or pneumothorax is identified. No acute osseous abnormality is seen. IMPRESSION: 1. Mild scarring or atelectasis in the left lower lung. No evidence of pneumonia or edema. 2. Cardiomegaly and pulmonary arterial enlargement. Electronically Signed   By: Logan Bores M.D.   On: 07/09/2019 13:25     Assessment & Plan    1. Atrial Fibrillation with RVR Complicated by Pauses - presented with worsening dyspnea on exertion and palpitations for the past 3 weeks which felt different from her typical asthma. Was diagnosed with atrial fibrillation by her PCP and started on Eliquis 5mg  BID and Cardizem CD 120mg  daily on 07/02/2019. She does report a syncopal episode last week with associated dizziness at that time.  - electrolytes and TSH WNL. BNP mildly elevated at 281. COVID negative. CTA negative for PE. - she did have significant pauses around 2100 on 5/25 with a pause of 6.29 seconds followed by a 3.44 second pause. Additional pause occurred at 0300 that was 3.30 seconds. The patient is unsure if she was asleep at the time of her pauses at 2100. Cardizem CD has since been discontinued. Will discuss with Dr. Domenic Polite in regards to starting low-dose BB or short-acting Cardizem today.  Her CHA2DS2-VASc Score is 3 (HTN, Female, Age), therefore continue with Eliquis 5mg  BID.  - an echocardiogram has been performed with results pending. If EF WNL and no significant LA dilation, may need to consider TEE/DCCV this admission. If abnormalities, would recommend EP evaluation this admission. She would benefit from an outpatient monitor to assess for any recurrent pauses or atrial fibrillation once back in sinus rhythm. She will also need an outpatient sleep study given her pauses, sleep-disordered breathing and newly diagnosed atrial fibrillation.   2. HTN - she was on Cardizem CD 120mg  daily and Lisinopril 10mg  daily prior to admission. Cardizem CD currently held due to pauses. BP well-controlled at 119/87 on most recent check.   3. Asthma Exacerbation - remains on IV steroids and scheduled nebulizer treatments. Would prefer Xopenex over Albuterol to avoid rebound tachycardia. Further management per admitting team.   4. Nocturnal Pauses/ Sleep-Disordered Breathing - would plan for an outpatient sleep study as outlined above.    For questions or updates, please contact [place] Please consult [email or web address] for contact info under Cardiology/STEMI.  Signed, Erma Heritage, PA-C 07/10/2019, 8:03 AM Pager: [phone]   Attending note:  Patient seen and examined.  I reviewed her records and discussed the case with Ms. Ahmed Prima PA-C.  Ms. Escott presents with recently diagnosed persistent atrial fibrillation, was started on Eliquis with CHA2DS2-VASc score of 3, also Cardizem CD 120 mg daily last week by PCP.  She presents describing worsening dyspnea on exertion, also recent brief syncopal event.  She also had evidence of hypoxic respiratory failure and was admitted for further observation including treatment of possible asthma exacerbation.  Overnight telemetry showed significant pauses ranging from 3 to 6 seconds.  Cardizem CD has been held today.  On examination this  morning she appears comfortable.,  Heart rate 90-100 range in atrial fibrillation by telemetry which I personally reviewed.  Systolic blood pressure ranging 115-130.  Lungs exhibit decreased breath sounds without wheezing.  Cardiac exam with irregularly irregular rhythm and no gallop.  Lab work includes potassium 4.5, creatinine 0.73, BNP 281,  hemoglobin 12.0, platelets 253 SH 1.91, SARS coronavirus 2 test negative.  Chest CTA negative for pulmonary embolus with extensive thoracic spondylosis.  Echocardiogram shows LVEF 70 to 75% with moderate LVH, normal RV contraction, trivial mitral regurgitation with normal left atrial chamber size.  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  She does have risk for underlying OSA which will need to be further investigated with sleep study.  Agree with holding Cardizem CD.  Continue Eliquis, we will plan on a TEE guided cardioversion for tomorrow morning.  Once back in sinus rhythm she can most likely be discharged with plan for further outpatient cardiac monitoring and determine if EP consultation is needed.  Satira Sark, M.D., F.A.C.C.

## 2019-07-10 NOTE — Progress Notes (Signed)
PROGRESS NOTE   Carolyn Silva  [id number] DOB: Jul 12, 1950 DOA: 07/09/2019 PCP: Celene Squibb, MD   Chief Complaint  Patient presents with  . Tachycardia    Brief Admission History:  69 y.o. female with medical history significant for asthma and hypertension, new diagnosis of atrial fibrillation.  Patient presented to the ED with complaints of difficulty breathing with exertion, palpitation and dizziness also with exertion, that improves with rest.  She reports wheezing also.  No cough, no leg swelling, no chest pain.  She is not on fluid pills.  Patient was diagnosed with atrial fibrillation with RVR about 3 weeks ago, she was placed started on Cardizem and Eliquis a week ago, she reports daily compliance with both, including today.  She is not on home O2.   Assessment & Plan:   Principal Problem:   Asthma exacerbation Active Problems:   Atrial fibrillation ([place])   Essential hypertension   1. Acute asthma exacerbation-this is improving with treatments.  Weaning down the IV steroids and she is almost off the oxygen.  We plan to wean to room air if possible today.  Change nebs to Xopenex. 2. 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  Cardiology has seen her and they are planning to do a cardioversion tomorrow.  She will be n.p.o. after midnight.  Cardizem has been discontinued due to bradycardia.  Patient is being anticoagulated with apixaban. 3. Essential hypertension-holding home lisinopril and Cardizem due to soft blood pressures.   DVT prophylaxis: Apixaban Code Status: Full Family Communication: None updated by telephone Disposition: Home  Status is: Observation  The patient remains OBS appropriate and will d/c before 2 midnights.  Dispo: The patient is from: Home              Anticipated d/c is to: Home              Anticipated d/c date is: 1 day              Patient currently is not  medically stable to d/c.         Consultants:   cardiolog  Procedures:   Tentatively planned cardioversion 5/27  Antimicrobials:     Subjective: Patient reports that her breathing is much better.  She has occasional wheezing.  She denies chest pain and palpitations.  Objective: Vitals:   07/09/19 2133 07/10/19 0106 07/10/19 0523 07/10/19 0843  BP: 126/76 (!) 134/96 119/87   Pulse: 90 89 94   Resp: 20 20 20    Temp: 99.3 F (37.4 C) 98.5 F (36.9 C) 98.5 F (36.9 C)   TempSrc: Oral Oral Oral   SpO2: 96% 95% 96% 97%  Weight:      Height:        Intake/Output Summary (Last 24 hours) at 07/10/2019 1318 Last data filed at 07/10/2019 0900 Gross per 24 hour  Intake 240 ml  Output --  Net 240 ml   Filed Weights   07/09/19 1210 07/09/19 1822  Weight: 130.6 kg 131.5 kg    Examination:  General exam: Appears calm and comfortable  Respiratory system: Clear to auscultation. Respiratory effort normal. Cardiovascular system: S1 & S2 heard, RRR. No JVD, murmurs, rubs, gallops or clicks. No pedal edema. Gastrointestinal system: Abdomen is nondistended, soft and nontender. No organomegaly or masses felt. Normal bowel sounds heard. Central nervous system: Alert and oriented. No focal neurological deficits. Extremities: Symmetric 5 x 5 power. Skin:  No rashes, lesions or ulcers Psychiatry: Judgement and insight appear normal. Mood & affect appropriate.   Data Reviewed: I have personally reviewed following labs and imaging studies  CBC: Recent Labs  Lab 07/09/19 1253  WBC 11.3*  NEUTROABS 9.3*  HGB 12.0  HCT 40.2  MCV 91.4  PLT 123456    Basic Metabolic Panel: Recent Labs  Lab 07/09/19 1253  NA 142  K 4.5  CL 100  CO2 32  GLUCOSE 112*  BUN 8  CREATININE 0.73  CALCIUM 9.6  MG 2.1    GFR: Estimated Creatinine Clearance: 95 mL/min (by C-G formula based on SCr of 0.73 mg/dL).  Liver Function Tests: No results for input(s): AST, ALT, ALKPHOS, BILITOT,  PROT, ALBUMIN in the last 168 hours.  CBG: No results for input(s): GLUCAP in the last 168 hours.  Recent Results (from the past 240 hour(s))  SARS Coronavirus 2 by RT PCR (hospital order, performed in [facility] hospital lab) Nasopharyngeal Nasopharyngeal Swab     Status: None   Collection Time: 07/09/19  4:00 PM   Specimen: Nasopharyngeal Swab  Result Value Ref Range Status   SARS Coronavirus 2 NEGATIVE NEGATIVE Final    Comment: (NOTE) SARS-CoV-2 target nucleic acids are NOT DETECTED. The SARS-CoV-2 RNA is generally detectable in upper and lower respiratory specimens during the acute phase of infection. The lowest concentration of SARS-CoV-2 viral copies this assay can detect is 250 copies / mL. A negative result does not preclude SARS-CoV-2 infection and should not be used as the sole basis for treatment or other patient management decisions.  A negative result may occur with improper specimen collection / handling, submission of specimen other than nasopharyngeal swab, presence of viral mutation(s) within the areas targeted by this assay, and inadequate number of viral copies (<250 copies / mL). A negative result must be combined with clinical observations, patient history, and epidemiological information. Fact Sheet for Patients:   StrictlyIdeas.no Fact Sheet for Healthcare Providers: BankingDealers.co.za This test is not yet approved or cleared  by the [place] FDA and has been authorized for detection and/or diagnosis of SARS-CoV-2 by FDA under an Emergency Use Authorization (EUA).  This EUA will remain in effect (meaning this test can be used) for the duration of the COVID-19 declaration under Section 564(b)(1) of the Act, 21 U.S.C. section 360bbb-3(b)(1), unless the authorization is terminated or revoked sooner. Performed at [facility], [address], [place], [place] [address]      Radiology Studies: CT Angio  Chest PE W and/or Wo Contrast  Result Date: 07/09/2019 CLINICAL DATA:  Shortness of breath, atrial fibrillation with rapid ventricular response intermittently for 3 weeks, dizziness EXAM: CT ANGIOGRAPHY CHEST WITH CONTRAST TECHNIQUE: Multidetector CT imaging of the chest was performed using the standard protocol during bolus administration of intravenous contrast. Multiplanar CT image reconstructions and MIPs were obtained to evaluate the vascular anatomy. CONTRAST:  127mL OMNIPAQUE IOHEXOL 350 MG/ML SOLN COMPARISON:  07/09/2019 FINDINGS: Cardiovascular: This is a technically adequate evaluation of the pulmonary vasculature. No filling defects or pulmonary emboli. The heart is mildly enlarged. No pericardial effusion. Thoracic aorta is normal in caliber without evidence of dissection. Mediastinum/Nodes: No enlarged mediastinal, hilar, or axillary lymph nodes. Thyroid gland, trachea, and esophagus demonstrate no significant findings. Lungs/Pleura: Hypoventilatory changes are seen within the left lower lobe. No airspace disease, effusion, or pneumothorax. The central airways are patent. Upper Abdomen: No acute abnormality. Musculoskeletal: No acute or destructive bony lesions. There is prominent spondylosis at T12/L1 with  left paracentral disc osteophyte complex resulting in at least mild central canal stenosis. Diffuse bridging anterior osteophytes throughout the entirety of the visualized thoracic spine. Significant disc space narrowing throughout the mid to upper thoracic spine. Reconstructed images demonstrate no additional findings. Review of the MIP images confirms the above findings. IMPRESSION: 1. No evidence of pulmonary embolus. 2. Extensive thoracic spondylosis. Electronically Signed   By: Randa Ngo M.D.   On: 07/09/2019 15:24   DG Chest Portable 1 View  Result Date: 07/09/2019 CLINICAL DATA:  Shortness of breath for 3 weeks. Atrial fibrillation. EXAM: PORTABLE CHEST 1 VIEW COMPARISON:   07/01/2019 FINDINGS: The cardiac silhouette remains mildly enlarged, and the central pulmonary arteries remain moderately enlarged. There is mild scarring or atelectasis in the left lower lung. No edema, sizable pleural effusion, or pneumothorax is identified. No acute osseous abnormality is seen. IMPRESSION: 1. Mild scarring or atelectasis in the left lower lung. No evidence of pneumonia or edema. 2. Cardiomegaly and pulmonary arterial enlargement. Electronically Signed   By: Logan Bores M.D.   On: 07/09/2019 13:25   ECHOCARDIOGRAM COMPLETE  Result Date: 07/10/2019    ECHOCARDIOGRAM REPORT   Patient Name:   Carolyn Silva Date of Exam: 07/10/2019 Medical Rec #:  [id number]         Height:       66.0 in Accession #:    [id number]        Weight:       289.9 lb Date of Birth:  Jun 30, 1950          BSA:          2.342 m Patient Age:    15 years          BP:           119/87 mmHg Patient Gender: F                 HR:           94 bpm. Exam Location:  [facility] Procedure: 2D Echo Indications:    Atrial Fibrillation 427.31  History:        Patient has no prior history of Echocardiogram examinations.                 Arrythmias:Atrial Fibrillation; Risk Factors:Hypertension.                 Asthma exacerbation.  Sonographer:    Leavy Cella RDCS (AE) Referring Phys: [place]  1. Left ventricular ejection fraction, by estimation, is 70 to 75%. The left ventricle has hyperdynamic function. The left ventricle has no regional wall motion abnormalities. There is moderate left ventricular hypertrophy. Left ventricular diastolic parameters are indeterminate.  2. Right ventricular systolic function is normal. The right ventricular size is normal. Tricuspid regurgitation signal is inadequate for assessing PA pressure.  3. Right atrial size was upper normal.  4. The mitral valve is grossly normal. Trivial mitral valve regurgitation.  5. The aortic valve is tricuspid. Aortic valve regurgitation is  not visualized.  6. The inferior vena cava is dilated in size with >50% respiratory variability, suggesting right atrial pressure of 8 mmHg. FINDINGS  Left Ventricle: Left ventricular ejection fraction, by estimation, is 70 to 75%. The left ventricle has hyperdynamic function. The left ventricle has no regional wall motion abnormalities. The left ventricular internal cavity size was normal in size. There is moderate left ventricular hypertrophy. Left ventricular diastolic parameters are indeterminate. Right Ventricle: The right  ventricular size is normal. No increase in right ventricular wall thickness. Right ventricular systolic function is normal. Tricuspid regurgitation signal is inadequate for assessing PA pressure. Left Atrium: Left atrial size was normal in size. Right Atrium: Right atrial size was upper normal. Pericardium: There is no evidence of pericardial effusion. Mitral Valve: The mitral valve is grossly normal. Trivial mitral valve regurgitation. Tricuspid Valve: The tricuspid valve is grossly normal. Tricuspid valve regurgitation is trivial. Aortic Valve: The aortic valve is tricuspid. Aortic valve regurgitation is not visualized. Moderate aortic valve annular calcification. Pulmonic Valve: The pulmonic valve was grossly normal. Pulmonic valve regurgitation is trivial. Aorta: The aortic root is normal in size and structure. Venous: The inferior vena cava is dilated in size with greater than 50% respiratory variability, suggesting right atrial pressure of 8 mmHg. IAS/Shunts: No atrial level shunt detected by color flow Doppler.  LEFT VENTRICLE PLAX 2D LVIDd:         4.29 cm  Diastology LVIDs:         2.26 cm  LV e' lateral:   13.20 cm/s LV PW:         1.63 cm  LV E/e' lateral: 8.8 LV IVS:        1.14 cm  LV e' medial:    8.59 cm/s LVOT diam:     1.70 cm  LV E/e' medial:  13.5 LVOT Area:     2.27 cm  RIGHT VENTRICLE RV S prime:     13.10 cm/s TAPSE (M-mode): 1.9 cm LEFT ATRIUM           Index        RIGHT ATRIUM           Index LA diam:      4.00 cm 1.71 cm/m  RA Area:     23.50 cm LA Vol (A2C): 34.3 ml 14.65 ml/m RA Volume:   77.70 ml  33.18 ml/m LA Vol (A4C): 50.0 ml 21.35 ml/m   AORTA Ao Root diam: 3.00 cm MITRAL VALVE MV Area (PHT): 4.06 cm     SHUNTS MV Decel Time: 187 msec     Systemic Diam: 1.70 cm MV E velocity: 116.00 cm/s MV A velocity: 34.90 cm/s MV E/A ratio:  3.32 Rozann Lesches MD Electronically signed by Rozann Lesches MD Signature Date/Time: 07/10/2019/8:56:12 AM    Final    Scheduled Meds: . apixaban  5 mg Oral BID  . methylPREDNISolone (SOLU-MEDROL) injection  60 mg Intravenous Q12H   Continuous Infusions:   LOS: 0 days   Time spent: 25 mins   Richel Millspaugh Wynetta Emery, MD How to contact the [facility] Attending or Consulting provider [place] or covering provider during after hours [place], for this patient?  1. Check the care team in [facility] and look for a) attending/consulting [facility] provider listed and b) the [facility] team listed 2. Log into [email or web address] and use [place]'s universal password to access. If you do not have the password, please contact the hospital operator. 3. Locate the [facility] provider you are looking for under [facility] and page to a number that you can be directly reached. 4. If you still have difficulty reaching the provider, please page the [facility] (Director on Call) for the Hospitalists listed on amion for assistance.  07/10/2019, 1:18 PM

## 2019-07-10 NOTE — Progress Notes (Signed)
Notified by tele patent had 4.02 second pause. Patient in bed with no complaints at this time. Dr. Wynetta Emery made aware. TEE scheduled for tomorrow. Will continue to monitor.

## 2019-07-10 NOTE — Care Management Obs Status (Signed)
[place] NOTIFICATION   Patient Details  Name: Carolyn Silva MRN: [id number] Date of Birth: 17-Oct-1950   Medicare Observation Status Notification Given:  Yes    Shade Flood, LCSW 07/10/2019, 4:03 PM

## 2019-07-11 ENCOUNTER — Observation Stay (HOSPITAL_COMMUNITY): Payer: Medicare Other | Admitting: Anesthesiology

## 2019-07-11 ENCOUNTER — Encounter (HOSPITAL_COMMUNITY)
Admission: EM | Disposition: A | Discharge status: Home Health | Payer: Self-pay | Source: Home / Self Care | Attending: Emergency Medicine

## 2019-07-11 ENCOUNTER — Encounter (HOSPITAL_COMMUNITY): Payer: Self-pay | Admitting: Internal Medicine

## 2019-07-11 ENCOUNTER — Observation Stay (HOSPITAL_BASED_OUTPATIENT_CLINIC_OR_DEPARTMENT_OTHER): Payer: Medicare Other

## 2019-07-11 DIAGNOSIS — I4819 Other persistent atrial fibrillation: Secondary | ICD-10-CM

## 2019-07-11 DIAGNOSIS — J45901 Unspecified asthma with (acute) exacerbation: Secondary | ICD-10-CM | POA: Diagnosis not present

## 2019-07-11 DIAGNOSIS — I10 Essential (primary) hypertension: Secondary | ICD-10-CM | POA: Diagnosis not present

## 2019-07-11 DIAGNOSIS — R0902 Hypoxemia: Secondary | ICD-10-CM | POA: Diagnosis not present

## 2019-07-11 DIAGNOSIS — J45909 Unspecified asthma, uncomplicated: Secondary | ICD-10-CM | POA: Diagnosis not present

## 2019-07-11 DIAGNOSIS — I361 Nonrheumatic tricuspid (valve) insufficiency: Secondary | ICD-10-CM

## 2019-07-11 DIAGNOSIS — I4891 Unspecified atrial fibrillation: Secondary | ICD-10-CM | POA: Diagnosis not present

## 2019-07-11 HISTORY — PX: TEE WITHOUT CARDIOVERSION: SHX5443

## 2019-07-11 HISTORY — PX: CARDIOVERSION: SHX1299

## 2019-07-11 LAB — BASIC METABOLIC PANEL
Anion gap: 11 (ref 5–15)
BUN: 17 mg/dL (ref 8–23)
CO2: 33 mmol/L — ABNORMAL HIGH (ref 22–32)
Calcium: 8.8 mg/dL — ABNORMAL LOW (ref 8.9–10.3)
Chloride: 99 mmol/L (ref 98–111)
Creatinine, Ser: 0.87 mg/dL (ref 0.44–1.00)
GFR calc Af Amer: >60 mL/min (ref >60)
GFR calc non Af Amer: >60 mL/min (ref >60)
Glucose, Bld: 144 mg/dL — ABNORMAL HIGH (ref 70–99)
Potassium: 4.9 mmol/L (ref 3.5–5.1)
Sodium: 143 mmol/L (ref 135–145)

## 2019-07-11 LAB — CBC WITH DIFFERENTIAL/PLATELET
Abs Immature Granulocytes: 0.23 10*3/uL — ABNORMAL HIGH (ref 0.00–0.07)
Basophils Absolute: 0 10*3/uL (ref 0.0–0.1)
Basophils Relative: 0 %
Eosinophils Absolute: 0 10*3/uL (ref 0.0–0.5)
Eosinophils Relative: 0 %
HCT: 44.1 % (ref 36.0–46.0)
Hemoglobin: 12.3 g/dL (ref 12.0–15.0)
Immature Granulocytes: 1 %
Lymphocytes Relative: 3 %
Lymphs Abs: 0.6 10*3/uL — ABNORMAL LOW (ref 0.7–4.0)
MCH: 26.9 pg (ref 26.0–34.0)
MCHC: 27.9 g/dL — ABNORMAL LOW (ref 30.0–36.0)
MCV: 96.3 fL (ref 80.0–100.0)
Monocytes Absolute: 0.8 10*3/uL (ref 0.1–1.0)
Monocytes Relative: 5 %
Neutro Abs: 16.7 10*3/uL — ABNORMAL HIGH (ref 1.7–7.7)
Neutrophils Relative %: 91 %
Platelets: 291 10*3/uL (ref 150–400)
RBC: 4.58 MIL/uL (ref 3.87–5.11)
RDW: 14.6 % (ref 11.5–15.5)
WBC: 18.4 10*3/uL — ABNORMAL HIGH (ref 4.0–10.5)
nRBC: 0 % (ref 0.0–0.2)

## 2019-07-11 LAB — MAGNESIUM: Magnesium: 2.3 mg/dL (ref 1.7–2.4)

## 2019-07-11 SURGERY — CARDIOVERSION
Anesthesia: General

## 2019-07-11 MED ORDER — KETAMINE HCL 50 MG/5ML IJ SOSY
PREFILLED_SYRINGE | INTRAMUSCULAR | Status: AC
  Filled 2019-07-11: qty 5

## 2019-07-11 MED ORDER — PROPOFOL 10 MG/ML IV BOLUS
INTRAVENOUS | Status: AC
  Filled 2019-07-11: qty 60

## 2019-07-11 MED ORDER — FENTANYL CITRATE (PF) 100 MCG/2ML IJ SOLN
INTRAMUSCULAR | Status: AC
  Filled 2019-07-11: qty 2

## 2019-07-11 MED ORDER — PROPOFOL 500 MG/50ML IV EMUL
INTRAVENOUS | Status: DC | PRN
  Administered 2019-07-11: 150 ug/kg/min via INTRAVENOUS

## 2019-07-11 MED ORDER — MIDAZOLAM HCL 2 MG/2ML IJ SOLN
INTRAMUSCULAR | Status: AC
  Filled 2019-07-11: qty 2

## 2019-07-11 MED ORDER — ORAL CARE MOUTH RINSE: 15.0000 mL | Freq: Once | OROMUCOSAL | Status: AC

## 2019-07-11 MED ORDER — KETAMINE HCL 10 MG/ML IJ SOLN
INTRAMUSCULAR | Status: DC | PRN
  Administered 2019-07-11: 15 mg via INTRAVENOUS

## 2019-07-11 MED ORDER — LACTATED RINGERS IV SOLN
Freq: Once | INTRAVENOUS | Status: AC
  Administered 2019-07-11: 1000 mL via INTRAVENOUS

## 2019-07-11 MED ORDER — LACTATED RINGERS IV SOLN: INTRAVENOUS | Status: DC | PRN

## 2019-07-11 MED ORDER — PREDNISONE 20 MG PO TABS
ORAL_TABLET | ORAL | 0 refills | Status: DC
Start: 2019-07-12 — End: 2019-07-23

## 2019-07-11 MED ORDER — CHLORHEXIDINE GLUCONATE 0.12 % MT SOLN
15.0000 mL | Freq: Once | OROMUCOSAL | Status: AC
  Administered 2019-07-11: 15 mL via OROMUCOSAL
  Filled 2019-07-11: qty 15

## 2019-07-11 MED ORDER — ALBUTEROL SULFATE HFA 108 (90 BASE) MCG/ACT IN AERS: 1.0000 | INHALATION_SPRAY | Freq: Four times a day (QID) | RESPIRATORY_TRACT | 2 refills | Status: DC | PRN

## 2019-07-11 NOTE — Evaluation (Signed)
Physical Therapy Evaluation Patient Details Name: Carolyn Silva MRN: [id number] DOB: 30-May-1950 Today's Date: 07/11/2019   History of Present Illness  Carolyn Silva is a 69 y.o. female with medical history significant for asthma and hypertension, new diagnosis of atrial fibrillation.Patient presented to the ED with complaints of difficulty breathing with exertion, palpitation and dizziness also with exertion, that improves with rest.  She reports wheezing also.  No cough, no leg swelling, no chest pain.  She is not on fluid pills.Patient was diagnosed with atrial fibrillation with RVR about 3 weeks ago, she was placed started on Cardizem and Eliquis a week ago, she reports daily compliance with both, including today.  She is not on home O2.    Clinical Impression  Patient functioning near baseline for functional mobility and gait. Patient does not require physical assist for bed mobility or to transfer to standing. She is able to complete transfer and ambulate without AD without loss of balance. She ambulates with slightly decreased cadence and becomes min/mod SOB at end of ambulation on room air which resolves quickly with rest. Patient will benefit from continued physical therapy in hospital and recommended venue below to increase strength, balance, endurance for safe ADLs and gait.      Follow Up Recommendations Home health PT    Equipment Recommendations  None recommended by PT    Recommendations for Other Services       Precautions / Restrictions Precautions Precautions: Fall Restrictions Weight Bearing Restrictions: No      Mobility  Bed Mobility Overal bed mobility: Needs Assistance Bed Mobility: Supine to Sit     Supine to sit: Modified independent (Device/Increase time);HOB elevated     General bed mobility comments: slighlty slow, labored  Transfers Overall transfer level: Modified independent Equipment used: None             General transfer comment:  slightly slow, min unsteadiness upon standing  Ambulation/Gait Ambulation/Gait assistance: Supervision Gait Distance (Feet): 125 Feet Assistive device: None Gait Pattern/deviations: Decreased step length - right;Decreased step length - left;Decreased stride length Gait velocity: decreased   General Gait Details: slightly slow, min/mod SOB at end of ambulation, no AD, on room air  Stairs            Wheelchair Mobility    Modified Rankin (Stroke Patients Only)       Balance Overall balance assessment: Mild deficits observed, not formally tested                                           Pertinent Vitals/Pain Pain Assessment: No/denies pain    Home Living Family/patient expects to be discharged to:: Private residence Living Arrangements: Children Available Help at Discharge: Family;Available 24 hours/day Type of Home: Apartment Home Access: Level entry     Home Layout: One level Home Equipment: None      Prior Function Level of Independence: Independent         Comments: Patient states independent without AD, needs no assist, drives, community ambulator     Hand Dominance        Extremity/Trunk Assessment   Upper Extremity Assessment Upper Extremity Assessment: Overall WFL for tasks assessed    Lower Extremity Assessment Lower Extremity Assessment: Generalized weakness    Cervical / Trunk Assessment Cervical / Trunk Assessment: Normal  Communication   Communication: No difficulties  Cognition Arousal/Alertness: Awake/alert Behavior  During Therapy: WFL for tasks assessed/performed Overall Cognitive Status: Within Functional Limits for tasks assessed                                        General Comments      Exercises     Assessment/Plan    PT Assessment Patient needs continued PT services  PT Problem List Decreased strength;Decreased mobility;Decreased activity tolerance;Decreased balance;Obesity        PT Treatment Interventions DME instruction;Therapeutic exercise;Gait training;Balance training;Stair training;Neuromuscular re-education;Functional mobility training;Therapeutic activities;Patient/family education    PT Goals (Current goals can be found in the Care Plan section)  Acute Rehab PT Goals Patient Stated Goal: return home with son PT Goal Formulation: With patient Time For Goal Achievement: 07/19/19 Potential to Achieve Goals: Good    Frequency Min 3X/week   Barriers to discharge        Co-evaluation               AM-PAC PT "6 Clicks" Mobility  Outcome Measure Help needed turning from your back to your side while in a flat bed without using bedrails?: None Help needed moving from lying on your back to sitting on the side of a flat bed without using bedrails?: A Little Help needed moving to and from a bed to a chair (including a wheelchair)?: None Help needed standing up from a chair using your arms (e.g., wheelchair or bedside chair)?: None Help needed to walk in hospital room?: None Help needed climbing 3-5 steps with a railing? : A Little 6 Click Score: 22    End of Session Equipment Utilized During Treatment: Gait belt Activity Tolerance: Patient tolerated treatment well Patient left: in bed;with call bell/phone within reach Nurse Communication: Mobility status PT Visit Diagnosis: Unsteadiness on feet (R26.81);Other abnormalities of gait and mobility (R26.89);Muscle weakness (generalized) (M62.81)    Time: [id number] PT Time Calculation (min) (ACUTE ONLY): 14 min   Charges:   PT Evaluation $PT Eval Low Complexity: 1 Low          2:06 PM, 07/11/19 Mearl Latin PT, DPT Physical Therapist at [facility]

## 2019-07-11 NOTE — Progress Notes (Signed)
PT Cancellation Note  Patient Details Name: Carolyn Silva MRN: [id number] DOB: 04/17/1950   Cancelled Treatment:    Reason Eval/Treat Not Completed: Patient at procedure or test/unavailable. Pt off floor for TEE, will continue to follow for evaluation.  Talbot Grumbling PT, DPT 07/11/19, 9:41 AM [phone]

## 2019-07-11 NOTE — Progress Notes (Signed)
SATURATION QUALIFICATIONS: (This note is used to comply with regulatory documentation for home oxygen)  Patient Saturations on Room Air at Rest = 97%  Patient Saturations on Room Air while Ambulating = 84%  Patient Saturations on 2 Liters of oxygen while Ambulating = 94%  Please briefly explain why patient needs home oxygen: 

## 2019-07-11 NOTE — Plan of Care (Signed)

## 2019-07-11 NOTE — Discharge Summary (Signed)
Physician Discharge Summary  ELIF IVERS [id number] DOB: 1950/02/28 DOA: 07/09/2019  PCP: Celene Squibb, MD  Admit date: 07/09/2019 Discharge date: 07/11/2019  Admitted From:  Home  Disposition: Home   Recommendations for Outpatient Follow-up:  1. Follow up with PCP in 1 weeks 2. Follow up with cardiology as scheduled 3. Wear 30 day monitor arranged by cardiology prior to DC 4. Sleep study referral requested  Home Health:  PT   Discharge Condition: STABLE  CODE STATUS: FULL    Brief Hospitalization Summary: Please see all hospital notes, images, labs for full details of the hospitalization. Brief Admission History:  69 y.o.femalewith medical history significant forasthma and hypertension,new diagnosis of atrial fibrillation.  Patient presented to the ED with complaints of difficulty breathing with exertion, palpitation and dizziness also with exertion,that improves with rest. She reports wheezing also.No cough, no leg swelling, no chest pain. She is not on fluid pills.  Patient was diagnosed with atrial fibrillation with RVR about 3 weeks ago, she was placed started on Cardizem and Eliquis a week ago, she reports daily compliance with both,including today. She is not on home O2.  Assessment & Plan:   Principal Problem:   Asthma exacerbation Active Problems:   Atrial fibrillation ([place])   Essential hypertension   1. Acute asthma exacerbation-this is improving with treatments.  She is off IV steroids.   We plan to wean to room air.   Home oxygen eval prior to DC and plan to refer for sleep study at discharge. Pt has worked with PT and HHPT arranged.  2. 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  Cardiology has seen her and did a successful cardioversion today.  Patient is being anticoagulated with apixaban and sent home on 30 day monitor and discontinued AV node blocking  agents. 3. Essential hypertension - BPs have been soft, DC lisinopril and cardizem.   DVT prophylaxis: Apixaban Code Status: Full Family Communication: son updated telephone Disposition: Home  Discharge Diagnoses:  Principal Problem:   Asthma exacerbation Active Problems:   Atrial fibrillation [facility])   Essential hypertension   Hypoxia   Discharge Instructions: Discharge Instructions    Ambulatory referral to Sleep Studies   Complete by: As directed      Allergies as of 07/11/2019      Reactions   Strawberry C [ascorbic Acid] Hives      Medication List    STOP taking these medications   diltiazem 120 MG 24 hr capsule Commonly known as: CARDIZEM CD   lisinopril 10 MG tablet Commonly known as: ZESTRIL     TAKE these medications   albuterol 108 (90 Base) MCG/ACT inhaler Commonly known as: VENTOLIN HFA Inhale 1-2 puffs into the lungs every 6 (six) hours as needed for wheezing or shortness of breath.   Eliquis 5 MG Tabs tablet Generic drug: apixaban Take 5 mg by mouth 2 (two) times daily.   predniSONE 20 MG tablet Commonly known as: DELTASONE Take 2 PO QAM x3days, 1 PO QAM x3days Start taking on: Jul 12, 2019      Follow-up Information    Erma Heritage, PA-C Follow up on 08/01/2019.   Specialties: Physician Assistant, Cardiology Why: Cardiology Hospital Follow-up on 08/01/2019 at 3:30 PM with Bernerd Pho, PA-C (works with Dr. Domenic Polite). This will replace your prior appointment for 6/25 since you are now an established patient.  Contact information: [address] [place] [address] [phone]  Celene Squibb, MD. Schedule an appointment as soon as possible for a visit in 1 week(s).   Specialty: Internal Medicine Contact information: [place] [address] [phone]          Allergies  Allergen Reactions  . Strawberry C [Ascorbic Acid] Hives   Allergies as of 07/11/2019      Reactions   Strawberry C  [ascorbic Acid] Hives      Medication List    STOP taking these medications   diltiazem 120 MG 24 hr capsule Commonly known as: CARDIZEM CD   lisinopril 10 MG tablet Commonly known as: ZESTRIL     TAKE these medications   albuterol 108 (90 Base) MCG/ACT inhaler Commonly known as: VENTOLIN HFA Inhale 1-2 puffs into the lungs every 6 (six) hours as needed for wheezing or shortness of breath.   Eliquis 5 MG Tabs tablet Generic drug: apixaban Take 5 mg by mouth 2 (two) times daily.   predniSONE 20 MG tablet Commonly known as: DELTASONE Take 2 PO QAM x3days, 1 PO QAM x3days Start taking on: Jul 12, 2019       Procedures/Studies: DG Chest 2 View  Result Date: 07/01/2019 CLINICAL DATA:  Cough EXAM: CHEST - 2 VIEW COMPARISON:  08/16/2013 FINDINGS: No focal airspace disease or effusion. Enlarged cardiomediastinal silhouette. Enlarged central pulmonary arteries. Aortic atherosclerosis. No pneumothorax. IMPRESSION: No active cardiopulmonary disease. Cardiomegaly. Enlarged central pulmonary arteries suggesting arterial hypertension. Electronically Signed   By: Donavan Foil M.D.   On: 07/01/2019 23:28   CT Angio Chest PE W and/or Wo Contrast  Result Date: 07/09/2019 CLINICAL DATA:  Shortness of breath, atrial fibrillation with rapid ventricular response intermittently for 3 weeks, dizziness EXAM: CT ANGIOGRAPHY CHEST WITH CONTRAST TECHNIQUE: Multidetector CT imaging of the chest was performed using the standard protocol during bolus administration of intravenous contrast. Multiplanar CT image reconstructions and MIPs were obtained to evaluate the vascular anatomy. CONTRAST:  168mL OMNIPAQUE IOHEXOL 350 MG/ML SOLN COMPARISON:  07/09/2019 FINDINGS: Cardiovascular: This is a technically adequate evaluation of the pulmonary vasculature. No filling defects or pulmonary emboli. The heart is mildly enlarged. No pericardial effusion. Thoracic aorta is normal in caliber without evidence of  dissection. Mediastinum/Nodes: No enlarged mediastinal, hilar, or axillary lymph nodes. Thyroid gland, trachea, and esophagus demonstrate no significant findings. Lungs/Pleura: Hypoventilatory changes are seen within the left lower lobe. No airspace disease, effusion, or pneumothorax. The central airways are patent. Upper Abdomen: No acute abnormality. Musculoskeletal: No acute or destructive bony lesions. There is prominent spondylosis at T12/L1 with left paracentral disc osteophyte complex resulting in at least mild central canal stenosis. Diffuse bridging anterior osteophytes throughout the entirety of the visualized thoracic spine. Significant disc space narrowing throughout the mid to upper thoracic spine. Reconstructed images demonstrate no additional findings. Review of the MIP images confirms the above findings. IMPRESSION: 1. No evidence of pulmonary embolus. 2. Extensive thoracic spondylosis. Electronically Signed   By: Randa Ngo M.D.   On: 07/09/2019 15:24   DG Chest Portable 1 View  Result Date: 07/09/2019 CLINICAL DATA:  Shortness of breath for 3 weeks. Atrial fibrillation. EXAM: PORTABLE CHEST 1 VIEW COMPARISON:  07/01/2019 FINDINGS: The cardiac silhouette remains mildly enlarged, and the central pulmonary arteries remain moderately enlarged. There is mild scarring or atelectasis in the left lower lung. No edema, sizable pleural effusion, or pneumothorax is identified. No acute osseous abnormality is seen. IMPRESSION: 1. Mild scarring or atelectasis in the left lower lung. No evidence of  pneumonia or edema. 2. Cardiomegaly and pulmonary arterial enlargement. Electronically Signed   By: Logan Bores M.D.   On: 07/09/2019 13:25   ECHOCARDIOGRAM COMPLETE  Result Date: 07/10/2019    ECHOCARDIOGRAM REPORT   Patient Name:   Ethelda Chick Date of Exam: 07/10/2019 Medical Rec #:  [id number]         Height:       66.0 in Accession #:    [id number]        Weight:       289.9 lb Date of Birth:   30-Jan-1951          BSA:          2.342 m Patient Age:    69 years          BP:           119/87 mmHg Patient Gender: F                 HR:           94 bpm. Exam Location:  [facility] Procedure: 2D Echo Indications:    Atrial Fibrillation 427.31  History:        Patient has no prior history of Echocardiogram examinations.                 Arrythmias:Atrial Fibrillation; Risk Factors:Hypertension.                 Asthma exacerbation.  Sonographer:    Leavy Cella RDCS (AE) Referring Phys: [place]  1. Left ventricular ejection fraction, by estimation, is 70 to 75%. The left ventricle has hyperdynamic function. The left ventricle has no regional wall motion abnormalities. There is moderate left ventricular hypertrophy. Left ventricular diastolic parameters are indeterminate.  2. Right ventricular systolic function is normal. The right ventricular size is normal. Tricuspid regurgitation signal is inadequate for assessing PA pressure.  3. Right atrial size was upper normal.  4. The mitral valve is grossly normal. Trivial mitral valve regurgitation.  5. The aortic valve is tricuspid. Aortic valve regurgitation is not visualized.  6. The inferior vena cava is dilated in size with >50% respiratory variability, suggesting right atrial pressure of 8 mmHg. FINDINGS  Left Ventricle: Left ventricular ejection fraction, by estimation, is 70 to 75%. The left ventricle has hyperdynamic function. The left ventricle has no regional wall motion abnormalities. The left ventricular internal cavity size was normal in size. There is moderate left ventricular hypertrophy. Left ventricular diastolic parameters are indeterminate. Right Ventricle: The right ventricular size is normal. No increase in right ventricular wall thickness. Right ventricular systolic function is normal. Tricuspid regurgitation signal is inadequate for assessing PA pressure. Left Atrium: Left atrial size was normal in size. Right  Atrium: Right atrial size was upper normal. Pericardium: There is no evidence of pericardial effusion. Mitral Valve: The mitral valve is grossly normal. Trivial mitral valve regurgitation. Tricuspid Valve: The tricuspid valve is grossly normal. Tricuspid valve regurgitation is trivial. Aortic Valve: The aortic valve is tricuspid. Aortic valve regurgitation is not visualized. Moderate aortic valve annular calcification. Pulmonic Valve: The pulmonic valve was grossly normal. Pulmonic valve regurgitation is trivial. Aorta: The aortic root is normal in size and structure. Venous: The inferior vena cava is dilated in size with greater than 50% respiratory variability, suggesting right atrial pressure of 8 mmHg. IAS/Shunts: No atrial level shunt detected by color flow Doppler.  LEFT VENTRICLE PLAX 2D LVIDd:  4.29 cm  Diastology LVIDs:         2.26 cm  LV e' lateral:   13.20 cm/s LV PW:         1.63 cm  LV E/e' lateral: 8.8 LV IVS:        1.14 cm  LV e' medial:    8.59 cm/s LVOT diam:     1.70 cm  LV E/e' medial:  13.5 LVOT Area:     2.27 cm  RIGHT VENTRICLE RV S prime:     13.10 cm/s TAPSE (M-mode): 1.9 cm LEFT ATRIUM           Index       RIGHT ATRIUM           Index LA diam:      4.00 cm 1.71 cm/m  RA Area:     23.50 cm LA Vol (A2C): 34.3 ml 14.65 ml/m RA Volume:   77.70 ml  33.18 ml/m LA Vol (A4C): 50.0 ml 21.35 ml/m   AORTA Ao Root diam: 3.00 cm MITRAL VALVE MV Area (PHT): 4.06 cm     SHUNTS MV Decel Time: 187 msec     Systemic Diam: 1.70 cm MV E velocity: 116.00 cm/s MV A velocity: 34.90 cm/s MV E/A ratio:  3.32 Rozann Lesches MD Electronically signed by Rozann Lesches MD Signature Date/Time: 07/10/2019/8:56:12 AM    Final    ECHO TEE  Result Date: 07/11/2019    TRANSESOPHOGEAL ECHO REPORT   Patient Name:   Ethelda Chick Date of Exam: 07/11/2019 Medical Rec #:  [id number]         Height:       66.0 in Accession #:    [id number]        Weight:       289.9 lb Date of Birth:  21-Apr-1950          BSA:           2.342 m Patient Age:    78 years          BP:           104/53 mmHg Patient Gender: F                 HR:           87 bpm. Exam Location:  [facility] Procedure: Transesophageal Echo, Cardiac Doppler and Color Doppler Indications:     Atrial fibrillation  History:         Patient has prior history of Echocardiogram examinations, most                  recent 07/10/2019. Arrythmias:Atrial Fibrillation,                  Signs/Symptoms:Shortness of Breath; Risk Factors:Hypertension.  Sonographer:     Dustin Flock RDCS Referring Phys:  [id number] Erma Heritage Diagnosing Phys: Rozann Lesches MD PROCEDURE: After discussion of the risks and benefits of a TEE, an informed consent was obtained. TEE procedure time was 5 minutes. The transesophogeal probe was passed without difficulty through the esophogus of the patient. Imaged were obtained with the patient in a left lateral decubitus position. Local oropharyngeal anesthetic was provided with Cetacaine. Sedation performed by different physician. The patient was monitored while under deep sedation. Anesthestetic sedation was provided intravenously by Anesthesiology: 147.94mg  of Propofol. Image quality was good. The patient's vital signs; including heart rate, blood pressure, and oxygen saturation; remained stable throughout the procedure. The patient  developed no complications during  the procedure. IMPRESSIONS  1. Left ventricular ejection fraction, by estimation, is 65 to 70%. The left ventricle has normal function. The left ventricle has no regional wall motion abnormalities. There is mild left ventricular hypertrophy.  2. Right ventricular systolic function is normal. The right ventricular size is normal.  3. No left atrial/left atrial appendage thrombus was detected. The LAA emptying velocity was 40 cm/s.  4. The mitral valve is grossly normal. Trivial mitral valve regurgitation.  5. The aortic valve is tricuspid. Aortic valve regurgitation is not  visualized.  6. Evidence of atrial level shunting detected by color flow Doppler. There is a small atrial septal defect with predominantly left to right shunting across the atrial septum. FINDINGS  Left Ventricle: Left ventricular ejection fraction, by estimation, is 65 to 70%. The left ventricle has normal function. The left ventricle has no regional wall motion abnormalities. The left ventricular internal cavity size was normal in size. There is  mild left ventricular hypertrophy. Right Ventricle: The right ventricular size is normal. No increase in right ventricular wall thickness. Right ventricular systolic function is normal. Left Atrium: Left atrial size was normal in size. No left atrial/left atrial appendage thrombus was detected. The LAA emptying velocity was 40 cm/s. Right Atrium: Right atrial size was normal in size. Pericardium: The pericardium was not assessed. Mitral Valve: The mitral valve is grossly normal. Trivial mitral valve regurgitation. Tricuspid Valve: The tricuspid valve is grossly normal. Tricuspid valve regurgitation is mild. Aortic Valve: The aortic valve is tricuspid. Aortic valve regurgitation is not visualized. Pulmonic Valve: The pulmonic valve was not well visualized. Pulmonic valve regurgitation is not visualized. Aorta: The aortic root is normal in size and structure. There is minimal (Grade I) plaque involving the descending aorta. IAS/Shunts: Evidence of atrial level shunting detected by color flow Doppler. There is a small atrial septal defect with predominantly left to right shunting across the atrial septum. Rozann Lesches MD Electronically signed by Rozann Lesches MD Signature Date/Time: 07/11/2019/12:44:03 PM    Final      Subjective: Pt feeling better, tolerated cardioversion very well.  Has monitor placed by cardiology in place.   Discharge Exam: Vitals:   07/11/19 1133 07/11/19 1203  BP: 113/66 120/61  Pulse: 74 73  Resp: 16 18  Temp:  98.5 F (36.9 C)   SpO2: 99% 94%   Vitals:   07/11/19 0820 07/11/19 0930 07/11/19 1133 07/11/19 1203  BP: 133/88 (!) 104/53 113/66 120/61  Pulse: (!) 108 87 74 73  Resp: (!) 26  16 18   Temp: 98.3 F (36.8 C)   98.5 F (36.9 C)  TempSrc:    Oral  SpO2: 98% 94% 99% 94%  Weight:      Height:       General: Pt is alert, awake, not in acute distress Cardiovascular: RRR, S1/S2 +, no rubs, no gallops Respiratory: CTA bilaterally, no wheezing, no rhonchi Abdominal: Soft, NT, ND, bowel sounds + Extremities: no edema, no cyanosis   The results of significant diagnostics from this hospitalization (including imaging, microbiology, ancillary and laboratory) are listed below for reference.     Microbiology: Recent Results (from the past 240 hour(s))  SARS Coronavirus 2 by RT PCR (hospital order, performed in [facility] hospital lab) Nasopharyngeal Nasopharyngeal Swab     Status: None   Collection Time: 07/09/19  4:00 PM   Specimen: Nasopharyngeal Swab  Result Value Ref Range Status   SARS Coronavirus 2 NEGATIVE NEGATIVE Final  Comment: (NOTE) SARS-CoV-2 target nucleic acids are NOT DETECTED. The SARS-CoV-2 RNA is generally detectable in upper and lower respiratory specimens during the acute phase of infection. The lowest concentration of SARS-CoV-2 viral copies this assay can detect is 250 copies / mL. A negative result does not preclude SARS-CoV-2 infection and should not be used as the sole basis for treatment or other patient management decisions.  A negative result may occur with improper specimen collection / handling, submission of specimen other than nasopharyngeal swab, presence of viral mutation(s) within the areas targeted by this assay, and inadequate number of viral copies (<250 copies / mL). A negative result must be combined with clinical observations, patient history, and epidemiological information. Fact Sheet for Patients:   StrictlyIdeas.no Fact Sheet for  Healthcare Providers: BankingDealers.co.za This test is not yet approved or cleared  by the [place] FDA and has been authorized for detection and/or diagnosis of SARS-CoV-2 by FDA under an Emergency Use Authorization (EUA).  This EUA will remain in effect (meaning this test can be used) for the duration of the COVID-19 declaration under Section 564(b)(1) of the Act, 21 U.S.C. section 360bbb-3(b)(1), unless the authorization is terminated or revoked sooner. Performed at [facility], [address], [place], [place] [address]      Labs: BNP (last 3 results) Recent Labs    07/09/19 1253  BNP [id number]*   Basic Metabolic Panel: Recent Labs  Lab 07/09/19 1253 07/11/19 0610  NA 142 143  K 4.5 4.9  CL 100 99  CO2 32 33*  GLUCOSE 112* 144*  BUN 8 17  CREATININE 0.73 0.87  CALCIUM 9.6 8.8*  MG 2.1 2.3   Liver Function Tests: No results for input(s): AST, ALT, ALKPHOS, BILITOT, PROT, ALBUMIN in the last 168 hours. No results for input(s): LIPASE, AMYLASE in the last 168 hours. No results for input(s): AMMONIA in the last 168 hours. CBC: Recent Labs  Lab 07/09/19 1253 07/11/19 0610  WBC 11.3* 18.4*  NEUTROABS 9.3* 16.7*  HGB 12.0 12.3  HCT 40.2 44.1  MCV 91.4 96.3  PLT 253 291   Cardiac Enzymes: No results for input(s): CKTOTAL, CKMB, CKMBINDEX, TROPONINI in the last 168 hours. BNP: Invalid input(s): POCBNP CBG: No results for input(s): GLUCAP in the last 168 hours. D-Dimer No results for input(s): DDIMER in the last 72 hours. Hgb A1c No results for input(s): HGBA1C in the last 72 hours. Lipid Profile No results for input(s): CHOL, HDL, LDLCALC, TRIG, CHOLHDL, LDLDIRECT in the last 72 hours. Thyroid function studies Recent Labs    07/09/19 1253  TSH 1.908   Anemia work up No results for input(s): VITAMINB12, FOLATE, FERRITIN, TIBC, IRON, RETICCTPCT in the last 72 hours. Urinalysis No results found for: COLORURINE, APPEARANCEUR,  [place], [place], [place], [place], [place], [place], PROTEINUR, UROBILINOGEN, NITRITE, LEUKOCYTESUR Sepsis Labs Invalid input(s): PROCALCITONIN,  WBC,  LACTICIDVEN Microbiology Recent Results (from the past 240 hour(s))  SARS Coronavirus 2 by RT PCR (hospital order, performed in [facility] hospital lab) Nasopharyngeal Nasopharyngeal Swab     Status: None   Collection Time: 07/09/19  4:00 PM   Specimen: Nasopharyngeal Swab  Result Value Ref Range Status   SARS Coronavirus 2 NEGATIVE NEGATIVE Final    Comment: (NOTE) SARS-CoV-2 target nucleic acids are NOT DETECTED. The SARS-CoV-2 RNA is generally detectable in upper and lower respiratory specimens during the acute phase of infection. The lowest concentration of SARS-CoV-2 viral copies this assay can detect is 250 copies / mL. A negative result does  not preclude SARS-CoV-2 infection and should not be used as the sole basis for treatment or other patient management decisions.  A negative result may occur with improper specimen collection / handling, submission of specimen other than nasopharyngeal swab, presence of viral mutation(s) within the areas targeted by this assay, and inadequate number of viral copies (<250 copies / mL). A negative result must be combined with clinical observations, patient history, and epidemiological information. Fact Sheet for Patients:   StrictlyIdeas.no Fact Sheet for Healthcare Providers: BankingDealers.co.za This test is not yet approved or cleared  by the [place] FDA and has been authorized for detection and/or diagnosis of SARS-CoV-2 by FDA under an Emergency Use Authorization (EUA).  This EUA will remain in effect (meaning this test can be used) for the duration of the COVID-19 declaration under Section 564(b)(1) of the Act, 21 U.S.C. section 360bbb-3(b)(1), unless the authorization is terminated or revoked sooner. Performed at [facility], [address] [place], [place] [address]     Time coordinating discharge: 33 minutes   SIGNED:  Irwin Brakeman, MD  [facility] 07/11/2019, 3:26 PM How to contact the [facility] Attending or Consulting provider [place] or covering provider during after hours [place], for this patient?  1. Check the care team in [facility] and look for a) attending/consulting [facility] provider listed and b) the [facility] team listed 2. Log into [email or web address] and use [place]'s universal password to access. If you do not have the password, please contact the hospital operator. 3. Locate the [facility] provider you are looking for under [facility] and page to a number that you can be directly reached. 4. If you still have difficulty reaching the provider, please page the [facility] (Director on Call) for the Hospitalists listed on amion for assistance.

## 2019-07-11 NOTE — Progress Notes (Signed)
Patient heart rate increased to 160's while bathing. No complaint of chest pain, SOB, or dizziness. Dr Darrick Meigs made aware. No new orders.

## 2019-07-11 NOTE — H&P (View-Only) (Signed)
Progress Note  Patient Name: Carolyn Silva Date of Encounter: 07/11/2019  Primary Cardiologist: Rozann Lesches, MD  Subjective   States that she slept well.  No chest pain or palpitations at rest.  Inpatient Medications    Scheduled Meds: Eliquis 5 mg twice daily   Vital Signs    Vitals:   07/11/19 0519 07/11/19 0623 07/11/19 0820 07/11/19 0930  BP: 96/74 125/71 133/88 (!) 104/53  Pulse: 96 93 (!) 108 87  Resp: 20  (!) 26   Temp: 98.6 F (37 C)  98.3 F (36.8 C)   TempSrc: Oral     SpO2: 100% 98% 98% 94%  Weight:      Height:        Intake/Output Summary (Last 24 hours) at 07/11/2019 0939 Last data filed at 07/11/2019 0932 Gross per 24 hour  Intake 780 ml  Output 0 ml  Net 780 ml   Filed Weights   07/09/19 1210 07/09/19 1822  Weight: 130.6 kg 131.5 kg    Telemetry    Atrial fibrillation, approximately 4 second pause yesterday afternoon, also RVR with taking a bath.  Personally reviewed.  ECG    An ECG dated 07/09/2019 was personally reviewed today and demonstrated:  Atrial fibrillation with decreased R wave progression and nonspecific ST changes.  Physical Exam   GEN:  Obese.  No acute distress.   Neck: No JVD. Cardiac:  Distant, irregularly irregular, no gallop.  Respiratory:  Decreased breath sounds without wheezing. GI: Soft, nontender, bowel sounds present. MS: No edema; No deformity. Neuro:  Nonfocal. Psych: Alert and oriented x 3. Normal affect.  Labs    Chemistry Recent Labs  Lab 07/09/19 1253 07/11/19 0610  NA 142 143  K 4.5 4.9  CL 100 99  CO2 32 33*  GLUCOSE 112* 144*  BUN 8 17  CREATININE 0.73 0.87  CALCIUM 9.6 8.8*  GFRNONAA >60 >60  GFRAA >60 >60  ANIONGAP 10 11     Hematology Recent Labs  Lab 07/09/19 1253 07/11/19 0610  WBC 11.3* 18.4*  RBC 4.40 4.58  HGB 12.0 12.3  HCT 40.2 44.1  MCV 91.4 96.3  MCH 27.3 26.9  MCHC 29.9* 27.9*  RDW 14.4 14.6  PLT 253 291    BNP Recent Labs  Lab 07/09/19 1253    BNP 281.0*     Radiology    CT Angio Chest PE W and/or Wo Contrast  Result Date: 07/09/2019 CLINICAL DATA:  Shortness of breath, atrial fibrillation with rapid ventricular response intermittently for 3 weeks, dizziness EXAM: CT ANGIOGRAPHY CHEST WITH CONTRAST TECHNIQUE: Multidetector CT imaging of the chest was performed using the standard protocol during bolus administration of intravenous contrast. Multiplanar CT image reconstructions and MIPs were obtained to evaluate the vascular anatomy. CONTRAST:  18mL OMNIPAQUE IOHEXOL 350 MG/ML SOLN COMPARISON:  07/09/2019 FINDINGS: Cardiovascular: This is a technically adequate evaluation of the pulmonary vasculature. No filling defects or pulmonary emboli. The heart is mildly enlarged. No pericardial effusion. Thoracic aorta is normal in caliber without evidence of dissection. Mediastinum/Nodes: No enlarged mediastinal, hilar, or axillary lymph nodes. Thyroid gland, trachea, and esophagus demonstrate no significant findings. Lungs/Pleura: Hypoventilatory changes are seen within the left lower lobe. No airspace disease, effusion, or pneumothorax. The central airways are patent. Upper Abdomen: No acute abnormality. Musculoskeletal: No acute or destructive bony lesions. There is prominent spondylosis at T12/L1 with left paracentral disc osteophyte complex resulting in at least mild central canal stenosis. Diffuse bridging anterior osteophytes throughout the entirety  of the visualized thoracic spine. Significant disc space narrowing throughout the mid to upper thoracic spine. Reconstructed images demonstrate no additional findings. Review of the MIP images confirms the above findings. IMPRESSION: 1. No evidence of pulmonary embolus. 2. Extensive thoracic spondylosis. Electronically Signed   By: Randa Ngo M.D.   On: 07/09/2019 15:24   DG Chest Portable 1 View  Result Date: 07/09/2019 CLINICAL DATA:  Shortness of breath for 3 weeks. Atrial fibrillation. EXAM:  PORTABLE CHEST 1 VIEW COMPARISON:  07/01/2019 FINDINGS: The cardiac silhouette remains mildly enlarged, and the central pulmonary arteries remain moderately enlarged. There is mild scarring or atelectasis in the left lower lung. No edema, sizable pleural effusion, or pneumothorax is identified. No acute osseous abnormality is seen. IMPRESSION: 1. Mild scarring or atelectasis in the left lower lung. No evidence of pneumonia or edema. 2. Cardiomegaly and pulmonary arterial enlargement. Electronically Signed   By: Logan Bores M.D.   On: 07/09/2019 13:25   ECHOCARDIOGRAM COMPLETE  Result Date: 07/10/2019    ECHOCARDIOGRAM REPORT   Patient Name:   Carolyn Silva Date of Exam: 07/10/2019 Medical Rec #:  [id number]         Height:       66.0 in Accession #:    [id number]        Weight:       289.9 lb Date of Birth:  Jan 24, 1951          BSA:          2.342 m Patient Age:    69 years          BP:           119/87 mmHg Patient Gender: F                 HR:           94 bpm. Exam Location:  [facility] Procedure: 2D Echo Indications:    Atrial Fibrillation 427.31  History:        Patient has no prior history of Echocardiogram examinations.                 Arrythmias:Atrial Fibrillation; Risk Factors:Hypertension.                 Asthma exacerbation.  Sonographer:    Leavy Cella RDCS (AE) Referring Phys: [place]  1. Left ventricular ejection fraction, by estimation, is 70 to 75%. The left ventricle has hyperdynamic function. The left ventricle has no regional wall motion abnormalities. There is moderate left ventricular hypertrophy. Left ventricular diastolic parameters are indeterminate.  2. Right ventricular systolic function is normal. The right ventricular size is normal. Tricuspid regurgitation signal is inadequate for assessing PA pressure.  3. Right atrial size was upper normal.  4. The mitral valve is grossly normal. Trivial mitral valve regurgitation.  5. The aortic valve is  tricuspid. Aortic valve regurgitation is not visualized.  6. The inferior vena cava is dilated in size with >50% respiratory variability, suggesting right atrial pressure of 8 mmHg. FINDINGS  Left Ventricle: Left ventricular ejection fraction, by estimation, is 70 to 75%. The left ventricle has hyperdynamic function. The left ventricle has no regional wall motion abnormalities. The left ventricular internal cavity size was normal in size. There is moderate left ventricular hypertrophy. Left ventricular diastolic parameters are indeterminate. Right Ventricle: The right ventricular size is normal. No increase in right ventricular wall thickness. Right ventricular systolic function is normal. Tricuspid regurgitation signal  is inadequate for assessing PA pressure. Left Atrium: Left atrial size was normal in size. Right Atrium: Right atrial size was upper normal. Pericardium: There is no evidence of pericardial effusion. Mitral Valve: The mitral valve is grossly normal. Trivial mitral valve regurgitation. Tricuspid Valve: The tricuspid valve is grossly normal. Tricuspid valve regurgitation is trivial. Aortic Valve: The aortic valve is tricuspid. Aortic valve regurgitation is not visualized. Moderate aortic valve annular calcification. Pulmonic Valve: The pulmonic valve was grossly normal. Pulmonic valve regurgitation is trivial. Aorta: The aortic root is normal in size and structure. Venous: The inferior vena cava is dilated in size with greater than 50% respiratory variability, suggesting right atrial pressure of 8 mmHg. IAS/Shunts: No atrial level shunt detected by color flow Doppler.  LEFT VENTRICLE PLAX 2D LVIDd:         4.29 cm  Diastology LVIDs:         2.26 cm  LV e' lateral:   13.20 cm/s LV PW:         1.63 cm  LV E/e' lateral: 8.8 LV IVS:        1.14 cm  LV e' medial:    8.59 cm/s LVOT diam:     1.70 cm  LV E/e' medial:  13.5 LVOT Area:     2.27 cm  RIGHT VENTRICLE RV S prime:     13.10 cm/s TAPSE (M-mode): 1.9  cm LEFT ATRIUM           Index       RIGHT ATRIUM           Index LA diam:      4.00 cm 1.71 cm/m  RA Area:     23.50 cm LA Vol (A2C): 34.3 ml 14.65 ml/m RA Volume:   77.70 ml  33.18 ml/m LA Vol (A4C): 50.0 ml 21.35 ml/m   AORTA Ao Root diam: 3.00 cm MITRAL VALVE MV Area (PHT): 4.06 cm     SHUNTS MV Decel Time: 187 msec     Systemic Diam: 1.70 cm MV E velocity: 116.00 cm/s MV A velocity: 34.90 cm/s MV E/A ratio:  3.32 Rozann Lesches MD Electronically signed by Rozann Lesches MD Signature Date/Time: 07/10/2019/8:56:12 AM    Final     Patient Profile     69 y.o. female with a history of hypertension and asthma now presenting with newly documented, persistent atrial fibrillation and also potential sinus node dysfunction with intermittent pauses.  Assessment & Plan    1.  Recently documented persistent atrial fibrillation, CHA2DS2-VASc score is 3.  She has had intermittent pauses ranging from 3 seconds to 6 seconds, not all of which have been nocturnal.  She presented on Cardizem CD 120 mg daily which was held.  She has been on Eliquis for stroke prophylaxis for approximately 1 week.  LVEF 70 to 75% by echocardiogram, normal left atrial chamber size.  2.  Essential hypertension, on lisinopril as an outpatient.  3.  Asthma.  4.  Morbid obesity, BMI 46.7.  At risk for OSA/OHS.  Plan to proceed with TEE guided cardioversion this morning.  If sinus rhythm restored, would continue Eliquis but not initiate any AV nodal blockers at this point.  She can potentially be discharged today with cardiac monitor to assess for any recurring pauses and further evidence of sinus node dysfunction that may require EP consultation.  She will also need to be set up for an outpatient sleep study.   Signed, Rozann Lesches, MD  07/11/2019, 9:39 AM

## 2019-07-11 NOTE — Progress Notes (Signed)
Progress Note  Patient Name: Carolyn Silva Date of Encounter: 07/11/2019  Primary Cardiologist: Rozann Lesches, MD  Subjective   States that she slept well.  No chest pain or palpitations at rest.  Inpatient Medications    Scheduled Meds: Eliquis 5 mg twice daily   Vital Signs    Vitals:   07/11/19 0519 07/11/19 0623 07/11/19 0820 07/11/19 0930  BP: 96/74 125/71 133/88 (!) 104/53  Pulse: 96 93 (!) 108 87  Resp: 20  (!) 26   Temp: 98.6 F (37 C)  98.3 F (36.8 C)   TempSrc: Oral     SpO2: 100% 98% 98% 94%  Weight:      Height:        Intake/Output Summary (Last 24 hours) at 07/11/2019 0939 Last data filed at 07/11/2019 0932 Gross per 24 hour  Intake 780 ml  Output 0 ml  Net 780 ml   Filed Weights   07/09/19 1210 07/09/19 1822  Weight: 130.6 kg 131.5 kg    Telemetry    Atrial fibrillation, approximately 4 second pause yesterday afternoon, also RVR with taking a bath.  Personally reviewed.  ECG    An ECG dated 07/09/2019 was personally reviewed today and demonstrated:  Atrial fibrillation with decreased R wave progression and nonspecific ST changes.  Physical Exam   GEN:  Obese.  No acute distress.   Neck: No JVD. Cardiac:  Distant, irregularly irregular, no gallop.  Respiratory:  Decreased breath sounds without wheezing. GI: Soft, nontender, bowel sounds present. MS: No edema; No deformity. Neuro:  Nonfocal. Psych: Alert and oriented x 3. Normal affect.  Labs    Chemistry Recent Labs  Lab 07/09/19 1253 07/11/19 0610  NA 142 143  K 4.5 4.9  CL 100 99  CO2 32 33*  GLUCOSE 112* 144*  BUN 8 17  CREATININE 0.73 0.87  CALCIUM 9.6 8.8*  GFRNONAA >60 >60  GFRAA >60 >60  ANIONGAP 10 11     Hematology Recent Labs  Lab 07/09/19 1253 07/11/19 0610  WBC 11.3* 18.4*  RBC 4.40 4.58  HGB 12.0 12.3  HCT 40.2 44.1  MCV 91.4 96.3  MCH 27.3 26.9  MCHC 29.9* 27.9*  RDW 14.4 14.6  PLT 253 291    BNP Recent Labs  Lab 07/09/19 1253    BNP 281.0*     Radiology    CT Angio Chest PE W and/or Wo Contrast  Result Date: 07/09/2019 CLINICAL DATA:  Shortness of breath, atrial fibrillation with rapid ventricular response intermittently for 3 weeks, dizziness EXAM: CT ANGIOGRAPHY CHEST WITH CONTRAST TECHNIQUE: Multidetector CT imaging of the chest was performed using the standard protocol during bolus administration of intravenous contrast. Multiplanar CT image reconstructions and MIPs were obtained to evaluate the vascular anatomy. CONTRAST:  148mL OMNIPAQUE IOHEXOL 350 MG/ML SOLN COMPARISON:  07/09/2019 FINDINGS: Cardiovascular: This is a technically adequate evaluation of the pulmonary vasculature. No filling defects or pulmonary emboli. The heart is mildly enlarged. No pericardial effusion. Thoracic aorta is normal in caliber without evidence of dissection. Mediastinum/Nodes: No enlarged mediastinal, hilar, or axillary lymph nodes. Thyroid gland, trachea, and esophagus demonstrate no significant findings. Lungs/Pleura: Hypoventilatory changes are seen within the left lower lobe. No airspace disease, effusion, or pneumothorax. The central airways are patent. Upper Abdomen: No acute abnormality. Musculoskeletal: No acute or destructive bony lesions. There is prominent spondylosis at T12/L1 with left paracentral disc osteophyte complex resulting in at least mild central canal stenosis. Diffuse bridging anterior osteophytes throughout the entirety  of the visualized thoracic spine. Significant disc space narrowing throughout the mid to upper thoracic spine. Reconstructed images demonstrate no additional findings. Review of the MIP images confirms the above findings. IMPRESSION: 1. No evidence of pulmonary embolus. 2. Extensive thoracic spondylosis. Electronically Signed   By: Randa Ngo M.D.   On: 07/09/2019 15:24   DG Chest Portable 1 View  Result Date: 07/09/2019 CLINICAL DATA:  Shortness of breath for 3 weeks. Atrial fibrillation. EXAM:  PORTABLE CHEST 1 VIEW COMPARISON:  07/01/2019 FINDINGS: The cardiac silhouette remains mildly enlarged, and the central pulmonary arteries remain moderately enlarged. There is mild scarring or atelectasis in the left lower lung. No edema, sizable pleural effusion, or pneumothorax is identified. No acute osseous abnormality is seen. IMPRESSION: 1. Mild scarring or atelectasis in the left lower lung. No evidence of pneumonia or edema. 2. Cardiomegaly and pulmonary arterial enlargement. Electronically Signed   By: Logan Bores M.D.   On: 07/09/2019 13:25   ECHOCARDIOGRAM COMPLETE  Result Date: 07/10/2019    ECHOCARDIOGRAM REPORT   Patient Name:   Carolyn Silva Date of Exam: 07/10/2019 Medical Rec #:  [id number]         Height:       66.0 in Accession #:    [id number]        Weight:       289.9 lb Date of Birth:  [birth year]          BSA:          2.342 m Patient Age:    69 years          BP:           119/87 mmHg Patient Gender: F                 HR:           94 bpm. Exam Location:  [facility] Procedure: 2D Echo Indications:    Atrial Fibrillation 427.31  History:        Patient has no prior history of Echocardiogram examinations.                 Arrythmias:Atrial Fibrillation; Risk Factors:Hypertension.                 Asthma exacerbation.  Sonographer:    Leavy Cella RDCS (AE) Referring Phys: [place]  1. Left ventricular ejection fraction, by estimation, is 70 to 75%. The left ventricle has hyperdynamic function. The left ventricle has no regional wall motion abnormalities. There is moderate left ventricular hypertrophy. Left ventricular diastolic parameters are indeterminate.  2. Right ventricular systolic function is normal. The right ventricular size is normal. Tricuspid regurgitation signal is inadequate for assessing PA pressure.  3. Right atrial size was upper normal.  4. The mitral valve is grossly normal. Trivial mitral valve regurgitation.  5. The aortic valve is  tricuspid. Aortic valve regurgitation is not visualized.  6. The inferior vena cava is dilated in size with >50% respiratory variability, suggesting right atrial pressure of 8 mmHg. FINDINGS  Left Ventricle: Left ventricular ejection fraction, by estimation, is 70 to 75%. The left ventricle has hyperdynamic function. The left ventricle has no regional wall motion abnormalities. The left ventricular internal cavity size was normal in size. There is moderate left ventricular hypertrophy. Left ventricular diastolic parameters are indeterminate. Right Ventricle: The right ventricular size is normal. No increase in right ventricular wall thickness. Right ventricular systolic function is normal. Tricuspid regurgitation signal  is inadequate for assessing PA pressure. Left Atrium: Left atrial size was normal in size. Right Atrium: Right atrial size was upper normal. Pericardium: There is no evidence of pericardial effusion. Mitral Valve: The mitral valve is grossly normal. Trivial mitral valve regurgitation. Tricuspid Valve: The tricuspid valve is grossly normal. Tricuspid valve regurgitation is trivial. Aortic Valve: The aortic valve is tricuspid. Aortic valve regurgitation is not visualized. Moderate aortic valve annular calcification. Pulmonic Valve: The pulmonic valve was grossly normal. Pulmonic valve regurgitation is trivial. Aorta: The aortic root is normal in size and structure. Venous: The inferior vena cava is dilated in size with greater than 50% respiratory variability, suggesting right atrial pressure of 8 mmHg. IAS/Shunts: No atrial level shunt detected by color flow Doppler.  LEFT VENTRICLE PLAX 2D LVIDd:         4.29 cm  Diastology LVIDs:         2.26 cm  LV e' lateral:   13.20 cm/s LV PW:         1.63 cm  LV E/e' lateral: 8.8 LV IVS:        1.14 cm  LV e' medial:    8.59 cm/s LVOT diam:     1.70 cm  LV E/e' medial:  13.5 LVOT Area:     2.27 cm  RIGHT VENTRICLE RV S prime:     13.10 cm/s TAPSE (M-mode): 1.9  cm LEFT ATRIUM           Index       RIGHT ATRIUM           Index LA diam:      4.00 cm 1.71 cm/m  RA Area:     23.50 cm LA Vol (A2C): 34.3 ml 14.65 ml/m RA Volume:   77.70 ml  33.18 ml/m LA Vol (A4C): 50.0 ml 21.35 ml/m   AORTA Ao Root diam: 3.00 cm MITRAL VALVE MV Area (PHT): 4.06 cm     SHUNTS MV Decel Time: 187 msec     Systemic Diam: 1.70 cm MV E velocity: 116.00 cm/s MV A velocity: 34.90 cm/s MV E/A ratio:  3.32 Rozann Lesches MD Electronically signed by Rozann Lesches MD Signature Date/Time: 07/10/2019/8:56:12 AM    Final     Patient Profile     69 y.o. female with a history of hypertension and asthma now presenting with newly documented, persistent atrial fibrillation and also potential sinus node dysfunction with intermittent pauses.  Assessment & Plan    1.  Recently documented persistent atrial fibrillation, CHA2DS2-VASc score is 3.  She has had intermittent pauses ranging from 3 seconds to 6 seconds, not all of which have been nocturnal.  She presented on Cardizem CD 120 mg daily which was held.  She has been on Eliquis for stroke prophylaxis for approximately 1 week.  LVEF 70 to 75% by echocardiogram, normal left atrial chamber size.  2.  Essential hypertension, on lisinopril as an outpatient.  3.  Asthma.  4.  Morbid obesity, BMI 46.7.  At risk for OSA/OHS.  Plan to proceed with TEE guided cardioversion this morning.  If sinus rhythm restored, would continue Eliquis but not initiate any AV nodal blockers at this point.  She can potentially be discharged today with cardiac monitor to assess for any recurring pauses and further evidence of sinus node dysfunction that may require EP consultation.  She will also need to be set up for an outpatient sleep study.   Signed, Rozann Lesches, MD  07/11/2019, 9:39 AM

## 2019-07-11 NOTE — Anesthesia Postprocedure Evaluation (Signed)
Anesthesia Post Note  Patient: Carolyn Silva  Procedure(s) Performed: CARDIOVERSION (N/A ) TRANSESOPHAGEAL ECHOCARDIOGRAM (TEE) WITH PROPOFOL (N/A )  Patient location during evaluation: PACU Anesthesia Type: General Level of consciousness: awake and alert and oriented Pain management: pain level controlled Vital Signs Assessment: post-procedure vital signs reviewed and stable Respiratory status: spontaneous breathing Cardiovascular status: blood pressure returned to baseline and stable Postop Assessment: no apparent nausea or vomiting Anesthetic complications: no     Last Vitals:  Vitals:   07/11/19 0623 07/11/19 0820  BP: 125/71 133/88  Pulse: 93 (!) 108  Resp:  (!) 26  Temp:  36.8 C  SpO2: 98% 98%    Last Pain:  Vitals:   07/11/19 0820  TempSrc:   PainSc: 0-No pain                 DANIEL,KAREN

## 2019-07-11 NOTE — Progress Notes (Signed)
  Echocardiogram Echocardiogram Transesophageal has been performed.  Matilde Bash 07/11/2019, 9:38 AM

## 2019-07-11 NOTE — Plan of Care (Signed)
  Problem: Acute Rehab PT Goals(only PT should resolve) Goal: Pt Will Ambulate Outcome: Progressing Flowsheets (Taken 07/11/2019 1408) Pt will Ambulate:  > 125 feet  with modified independence Goal: Pt/caregiver will Perform Home Exercise Program Outcome: Progressing Flowsheets (Taken 07/11/2019 1408) Pt/caregiver will Perform Home Exercise Program:  For increased strengthening  For improved balance  Independently   2:08 PM, 07/11/19 Mearl Latin PT, DPT Physical Therapist at [facility]

## 2019-07-11 NOTE — Transfer of Care (Signed)
Immediate Anesthesia Transfer of Care Note  Patient: Carolyn Silva  Procedure(s) Performed: CARDIOVERSION (N/A ) TRANSESOPHAGEAL ECHOCARDIOGRAM (TEE) WITH PROPOFOL (N/A )  Patient Location: PACU  Anesthesia Type:General  Level of Consciousness: awake  Airway & Oxygen Therapy: Patient Spontanous Breathing  Post-op Assessment: Report given to RN  Post vital signs: Reviewed and stable  Last Vitals:  Vitals Value Taken Time  BP    Temp    Pulse    Resp    SpO2      Last Pain:  Vitals:   07/11/19 0820  TempSrc:   PainSc: 0-No pain      Patients Stated Pain Goal: 6 ([id number])  Complications: No apparent anesthesia complications

## 2019-07-11 NOTE — CV Procedure (Signed)
Transesophageal echocardiogram guided cardioversion  Indication: Persistent atrial fibrillation  Description of procedure: After informed consent was obtained the patient was taken to the PACU where a timeout was performed.  She was initially placed in the left lateral decubitus position with oxygen supplementation and bite-block in place.  Moderate to deep sedation was achieved with use of propofol per the anesthesia service.  A multiplane transesophageal echocardiogram probe was inserted into the esophagus and multiple images were obtained.  Full detailed report is documented elsewhere.  In summary, LVEF is approximately 70% with normal RV contraction, no evidence of left atrial or right atrial appendage thrombus, and minimal atherosclerosis within the descending aorta.  Following removal of the bite block and transesophageal echocardiogram probe, patient was then repositioned supine with anterior and posterior pads placed, connected to a biphasic defibrillator.  A single, synchronized 120 J shock was delivered with successful restoration of sinus rhythm, confirmed by follow-up ECG.  Patient tolerated the procedure well without immediate complications.  Satira Sark, M.D., F.A.C.C.

## 2019-07-11 NOTE — TOC Transition Note (Addendum)
Transition of Care [facility]) - CM/SW Discharge Note   Patient Details  Name: Carolyn Silva MRN: [id number] Date of Birth: 1950-05-17  [facility] CM/SW Contact:  Shade Flood, LCSW Phone Number: 07/11/2019, 4:16 PM   Clinical Narrative:     Pt stable for dc today per MD. PT recommending [facility]. Spoke with pt to discuss and she is agreeable to [facility]. Discussed provider options and pt's insurance coverage. Pt agreeable to any provider that will accept her insurance.  Referred to [facility] and they will follow up with pt to schedule visits.  No other TOC needs identified at this time.   1745: Notified by RN that pt requires Home O2 for dc. Referred to [place] with [employer] and she will arrange for the O2 prior to pt leaving this evening.  Final next level of care: Home w Home Health Services Barriers to Discharge: Barriers Resolved   Patient Goals and CMS Choice   CMS Medicare.gov Compare Post Acute Care list provided to:: Patient Choice offered to / list presented to : Patient  Discharge Placement                       Discharge Plan and Services                          HH Arranged: PT [facility] Agency: [facility] Date [place]: 07/11/19   Representative spoke with at [place]: [place] ([place]) Interventions     Readmission Risk Interventions No flowsheet data found.

## 2019-07-11 NOTE — Discharge Instructions (Signed)
Asthma Attack  Acute bronchospasm caused by asthma is also referred to as an asthma attack. Bronchospasm means that the air passages become narrowed or "tight," which limits the amount of oxygen that can get into the lungs. The narrowing is caused by inflammation and tightening of the muscles in the air tubes (bronchi) in the lungs. Excessive mucus is also produced, which narrows the airways more. This can cause trouble breathing, coughing, and loud breathing (wheezing). What are the causes? Possible triggers include:  Animal dander from the skin, hair, or feathers of animals.  Dust mites contained in house dust.  Cockroaches.  Pollen from trees or grass.  Mold.  Cigarette or tobacco smoke.  Air pollutants such as dust, household cleaners, hair sprays, aerosol sprays, paint fumes, strong chemicals, or strong odors.  Cold air or weather changes. Cold air may trigger inflammation. Winds increase molds and pollens in the air.  Strong emotions such as crying or laughing hard.  Stress.  Certain medicines, such as aspirin or beta-blockers.  Sulfites in foods and drinks, such as dried fruits and wine.  Infections or inflammatory conditions, such as a flu, a cold, pneumonia, or inflammation of the nasal membranes (rhinitis).  Gastroesophageal reflux disease (GERD). GERD is a condition in which stomach acid backs up into your esophagus, which can irritate nearby airway structures.  Exercise or activity that requires a lot of energy. What are the signs or symptoms? Symptoms of this condition include:  Wheezing. This may sound like whistling while breathing. This may be more noticeable at night.  Excessive coughing, particularly at night.  Chest tightness or pain.  Shortness of breath.  Feeling like you cannot get enough air no matter how hard you try (air hunger). How is this diagnosed? This condition may be diagnosed based on:  Your medical history.  Your symptoms.  A  physical exam.  Tests to check for other causes of your symptoms or other conditions that may have triggered your asthma attack. These tests may include: ? Chest X-ray. ? Blood tests. ? Specialized tests to assess lung function, such as breathing into a device that measures how much air you inhale and exhale (spirometry). How is this treated? The goal of treatment is to open the airways in your lungs and reduce inflammation. Most asthma attacks are treated with medicines that you inhale through a hand-held inhaler (metered dose inhaler, MDI) or a device that turns liquid medicine into a mist that you inhale (nebulizer). Medicines may include:  Quick relief or rescue medicines that relax the muscles of the bronchi. These medicines include bronchodilators, such as albuterol.  Controller medicines, such as inhaled corticosteroids. These are long-acting medicines that are used for daily asthma maintenance. If you have a moderate or severe asthma attack, you may be treated with steroid medicines by mouth or through an IV injection at the hospital. Steroid medicines reduce inflammation in your lungs. Depending on the severity of your attack, you may need oxygen therapy to help you breathe. If your asthma attack was caused by a bacterial infection, such as pneumonia, you will be given antibiotic medicines. Follow these instructions at home: Medicines  Take over-the-counter and prescription medicines only as told by your health care provider. Keep your medicines up-to-date and available.  If you are more than [redacted] weeks pregnant and you are prescribed any new medicines, tell your obstetrician about those medicines.  If you were prescribed an antibiotic medicine, take it as told by your health care provider. Do not  stop taking the antibiotic even if you start to feel better. Avoiding triggers   Keep track of things that trigger your asthma attacks or cause you to have breathing problems, and avoid  exposure to these triggers.  Do not use any products that contain nicotine or tobacco, such as cigarettes and e-cigarettes. If you need help quitting, ask your health care provider.  Avoid secondhand smoke.  Avoid strong smells, such as perfumes, aerosols, and cleaning solvents.  When pollen or air pollution is bad, keep windows closed and use an air conditioner or go to places with air conditioning. Asthma action plan  Work with your health care provider to make a written plan for managing and treating your asthma attacks (asthma action plan). This plan should include: ? A list of your asthma triggers and how to avoid them. ? Information about when your medicines should be taken and when their dosage should be changed. ? Instructions about using a device called a peak flow meter to monitor your condition. A peak flow meter measures how well your lungs are working and measures how severe your asthma is at a given time. Your "personal best" is the highest peak flow rate you can reach when you feel good and have no asthma symptoms. General instructions  Avoid excessive exercise or activity until your asthma attack resolves. Ask your health care provider what activities are safe for you and when you can return to your normal activities.  Stay up to date on all vaccinations recommended by your health care provider, such as flu and pneumonia vaccines.  Drink enough fluid to keep your urine clear or pale yellow. Staying hydrated helps keep mucus in your lungs thin so it can be coughed up easily.  If you drink caffeine, do so in moderation.  Do not use alcohol until you have recovered.  Keep all follow-up visits as told by your health care provider. This is important. Asthma requires careful medical care, and you and your health care provider can work together to reduce the likelihood of future attacks. Contact a health care provider if:  Your peak flow reading is still at 50-79% of your  personal best after you have followed your action plan for 1 hour. This is in the yellow zone, which means "caution."  You need to use a reliever medicine more than 2-3 times a week.  Your medicines are causing side effects, such as: ? Rash. ? Itching. ? Swelling. ? Trouble breathing.  Your symptoms do not improve after 48 hours.  You cough up mucus (sputum) that is thicker than usual.  You have a fever.  You need to use your medicines much more frequently than normal. Get help right away if:  Your peak flow reading is less than 50% of your personal best. This is in the red zone, which means "danger."  You have severe trouble breathing.  You develop chest pain or discomfort.  Your medicines no longer seem to be helping.  You vomit.  You cannot eat or drink without vomiting.  You are coughing up yellow, green, brown, or bloody mucus.  You have a fever and your symptoms suddenly get worse.  You have trouble swallowing.  You feel very tired, and breathing becomes tiring. Summary  Acute bronchospasm caused by asthma is also referred to as an asthma attack.  Bronchospasm is caused by narrowing or tightness in air passages, which causes shortness of breath, coughing, and loud breathing (wheezing).  Many things can trigger an asthma  attack, such as allergens, weather changes, exercise, smoke, and other fumes.  Treatment for an asthma attack may include inhaled rescue medicines for immediate relief, as well as the use of maintenance therapy.  Get help right away if you have worsening shortness of breath, chest pain, or fever, or if your home medicines are no longer helping with your symptoms. This information is not intended to replace advice given to you by your health care provider. Make sure you discuss any questions you have with your health care provider. Document Revised: 05/22/2018 Document Reviewed: 03/04/2016 Elsevier Patient Education  [place].   IMPORTANT INFORMATION: PAY CLOSE ATTENTION   PHYSICIAN DISCHARGE INSTRUCTIONS  Follow with Primary care provider  Celene Squibb, MD  and other consultants as instructed by your Hospitalist Physician  [place] IF SYMPTOMS COME BACK, WORSEN OR NEW PROBLEM DEVELOPS   Please note: You were cared for by a hospitalist during your hospital stay. Every effort will be made to forward records to your primary care provider.  You can request that your primary care provider send for your hospital records if they have not received them.  Once you are discharged, your primary care physician will handle any further medical issues. Please note that NO REFILLS for any discharge medications will be authorized once you are discharged, as it is imperative that you return to your primary care physician (or establish a relationship with a primary care physician if you do not have one) for your post hospital discharge needs so that they can reassess your need for medications and monitor your lab values.  Please get a complete blood count and chemistry panel checked by your Primary MD at your next visit, and again as instructed by your Primary MD.  Get Medicines reviewed and adjusted: Please take all your medications with you for your next visit with your Primary MD  Laboratory/radiological data: Please request your Primary MD to go over all hospital tests and procedure/radiological results at the follow up, please ask your primary care provider to get all Hospital records sent to his/her office.  In some cases, they will be blood work, cultures and biopsy results pending at the time of your discharge. Please request that your primary care provider follow up on these results.  If you are diabetic, please bring your blood sugar readings with you to your follow up appointment with primary care.    Please call and make your follow up appointments as soon as possible.     Also Note the following: If you experience worsening of your admission symptoms, develop shortness of breath, life threatening emergency, suicidal or homicidal thoughts you must seek medical attention immediately by calling 911 or calling your MD immediately  if symptoms less severe.  You must read complete instructions/literature along with all the possible adverse reactions/side effects for all the Medicines you take and that have been prescribed to you. Take any new Medicines after you have completely understood and accpet all the possible adverse reactions/side effects.   Do not drive when taking Pain medications or sleeping medications (Benzodiazepines)  Do not take more than prescribed Pain, Sleep and Anxiety Medications. It is not advisable to combine anxiety,sleep and pain medications without talking with your primary care practitioner  Special Instructions: If you have smoked or chewed Tobacco  in the last 2 yrs please stop smoking, stop any regular Alcohol  and or any Recreational drug use.  Wear Seat belts while  driving.  Do not drive if taking any narcotic, mind altering or controlled substances or recreational drugs or alcohol.

## 2019-07-11 NOTE — Interval H&P Note (Signed)
History and Physical Interval Note:  07/11/2019  Patient presents for TEE guided cardioversion as discussed above.  Informed consent obtained and she is ready to proceed.  Satira Sark, M.D., F.A.C.C.

## 2019-07-11 NOTE — CV Procedure (Signed)
Electrical Cardioversion Procedure Note MOSHE BOAK [id number] 04/29/1950  Procedure: Electrical Cardioversion Indications:   Atrial Fibrillation  Procedure Details Consent: consent obtained Time Out: Verified patient identification, verified procedure, site/side was marked, verified correct patient position, special equipment/implants available, medications/allergies/relevent history reviewed, required imaging and test results available.  Time Out PERFORMED: 0855  Patient placed on cardiac monitor, pulse oximetry, supplemental oxygen as necessary.  Sedation given: propofol Pacer pads placed anterior, posterior  Cardioverted 1 time(s).  Cardioverted at 150 joules  Evaluation Findings: Post procedure EKG shows:NSR Complications: none Patient did tolerate procedure well.   Harmon Pier 07/11/2019, 4:24 PM

## 2019-07-11 NOTE — Progress Notes (Signed)
Went to d/c patient and her heart monitor was not working. I called the [phone] number. The device could not be fixed remotely. They suggested that they send her a new one in the mail and I said that it would probably be best for her to go to the doctors office to get a new one because she would not be able to set up a new one herself at home. The tech on the phone said she would contact the doctor's office and have them call the patient.

## 2019-07-11 NOTE — Progress Notes (Signed)
Nsg Discharge Note  Admit Date:  07/09/2019 Discharge date: 07/11/2019   LAKIESHA RAINS to be D/C'd Home per MD order.  AVS completed.  Copy for chart, and copy for patient signed, and dated. Removed IV-clean, dry, intact. Reviewed d/c paperwork with patient and son. Answered all questions. Wheeled stable patient and belongings (oxygen) to main entrance where she was picked up by her son to d/c to home. Patient/caregiver able to verbalize understanding.  Discharge Medication: Allergies as of 07/11/2019      Reactions   Strawberry C [ascorbic Acid] Hives      Medication List    STOP taking these medications   diltiazem 120 MG 24 hr capsule Commonly known as: CARDIZEM CD   lisinopril 10 MG tablet Commonly known as: ZESTRIL     TAKE these medications   albuterol 108 (90 Base) MCG/ACT inhaler Commonly known as: VENTOLIN HFA Inhale 1-2 puffs into the lungs every 6 (six) hours as needed for wheezing or shortness of breath.   Eliquis 5 MG Tabs tablet Generic drug: apixaban Take 5 mg by mouth 2 (two) times daily.   predniSONE 20 MG tablet Commonly known as: DELTASONE Take 2 PO QAM x3days, 1 PO QAM x3days Start taking on: Jul 12, 2019            Durable Medical Equipment  (From admission, onward)         Start     Ordered   07/11/19 1740  For home use only DME oxygen  Once    Question Answer Comment  Length of Need Lifetime   Mode or (Route) Nasal cannula   Liters per Minute 2   Frequency Continuous (stationary and portable oxygen unit needed)   Oxygen conserving device Yes   Oxygen delivery system Gas      07/11/19 1740          Discharge Assessment: Vitals:   07/11/19 1133 07/11/19 1203  BP: 113/66 120/61  Pulse: 74 73  Resp: 16 18  Temp:  98.5 F (36.9 C)  SpO2: 99% 94%   Skin clean, dry and intact without evidence of skin break down, no evidence of skin tears noted. IV catheter discontinued intact. Site without signs and symptoms of complications -  no redness or edema noted at insertion site, patient denies c/o pain - only slight tenderness at site.  Dressing with slight pressure applied.  D/c Instructions-Education: Discharge instructions given to patient/family with verbalized understanding. D/c education completed with patient/family including follow up instructions, medication list, d/c activities limitations if indicated, with other d/c instructions as indicated by MD - patient able to verbalize understanding, all questions fully answered. Patient instructed to return to ED, call 911, or call MD for any changes in condition.  Patient escorted via [place], and D/C home via private auto.  Santa Lighter, RN 07/11/2019 7:30 PM

## 2019-07-12 ENCOUNTER — Inpatient Hospital Stay (HOSPITAL_COMMUNITY)
Admission: EM | Admit: 2019-07-12 | Discharge: 2019-07-23 | DRG: 205 | Disposition: A | Discharge status: Home Health | Payer: Medicare Other | Attending: Internal Medicine | Admitting: Internal Medicine

## 2019-07-12 ENCOUNTER — Emergency Department (HOSPITAL_COMMUNITY): Payer: Medicare Other

## 2019-07-12 ENCOUNTER — Other Ambulatory Visit: Payer: Self-pay

## 2019-07-12 DIAGNOSIS — J9621 Acute and chronic respiratory failure with hypoxia: Secondary | ICD-10-CM | POA: Diagnosis present

## 2019-07-12 DIAGNOSIS — Z7901 Long term (current) use of anticoagulants: Secondary | ICD-10-CM

## 2019-07-12 DIAGNOSIS — I4891 Unspecified atrial fibrillation: Secondary | ICD-10-CM | POA: Diagnosis present

## 2019-07-12 DIAGNOSIS — J449 Chronic obstructive pulmonary disease, unspecified: Secondary | ICD-10-CM | POA: Diagnosis present

## 2019-07-12 DIAGNOSIS — Z6841 Body Mass Index (BMI) 40.0 and over, adult: Secondary | ICD-10-CM

## 2019-07-12 DIAGNOSIS — D72829 Elevated white blood cell count, unspecified: Secondary | ICD-10-CM | POA: Diagnosis present

## 2019-07-12 DIAGNOSIS — Z833 Family history of diabetes mellitus: Secondary | ICD-10-CM

## 2019-07-12 DIAGNOSIS — E662 Morbid (severe) obesity with alveolar hypoventilation: Principal | ICD-10-CM | POA: Diagnosis present

## 2019-07-12 DIAGNOSIS — I110 Hypertensive heart disease with heart failure: Secondary | ICD-10-CM | POA: Diagnosis present

## 2019-07-12 DIAGNOSIS — I4892 Unspecified atrial flutter: Secondary | ICD-10-CM | POA: Diagnosis present

## 2019-07-12 DIAGNOSIS — J9622 Acute and chronic respiratory failure with hypercapnia: Secondary | ICD-10-CM | POA: Diagnosis present

## 2019-07-12 DIAGNOSIS — K219 Gastro-esophageal reflux disease without esophagitis: Secondary | ICD-10-CM | POA: Diagnosis present

## 2019-07-12 DIAGNOSIS — I501 Left ventricular failure: Secondary | ICD-10-CM | POA: Diagnosis present

## 2019-07-12 DIAGNOSIS — J9602 Acute respiratory failure with hypercapnia: Secondary | ICD-10-CM | POA: Diagnosis present

## 2019-07-12 DIAGNOSIS — R0602 Shortness of breath: Secondary | ICD-10-CM

## 2019-07-12 DIAGNOSIS — Z20822 Contact with and (suspected) exposure to covid-19: Secondary | ICD-10-CM | POA: Diagnosis present

## 2019-07-12 DIAGNOSIS — Z841 Family history of disorders of kidney and ureter: Secondary | ICD-10-CM

## 2019-07-12 DIAGNOSIS — E8729 Other acidosis: Secondary | ICD-10-CM

## 2019-07-12 LAB — CBC WITH DIFFERENTIAL/PLATELET
Abs Immature Granulocytes: 0.09 10*3/uL — ABNORMAL HIGH (ref 0.00–0.07)
Basophils Absolute: 0 10*3/uL (ref 0.0–0.1)
Basophils Relative: 0 %
Eosinophils Absolute: 0 10*3/uL (ref 0.0–0.5)
Eosinophils Relative: 0 %
HCT: 41.4 % (ref 36.0–46.0)
Hemoglobin: 11.8 g/dL — ABNORMAL LOW (ref 12.0–15.0)
Immature Granulocytes: 1 %
Lymphocytes Relative: 6 %
Lymphs Abs: 1 10*3/uL (ref 0.7–4.0)
MCH: 27.6 pg (ref 26.0–34.0)
MCHC: 28.5 g/dL — ABNORMAL LOW (ref 30.0–36.0)
MCV: 96.7 fL (ref 80.0–100.0)
Monocytes Absolute: 1.4 10*3/uL — ABNORMAL HIGH (ref 0.1–1.0)
Monocytes Relative: 8 %
Neutro Abs: 14 10*3/uL — ABNORMAL HIGH (ref 1.7–7.7)
Neutrophils Relative %: 85 %
Platelets: 262 10*3/uL (ref 150–400)
RBC: 4.28 MIL/uL (ref 3.87–5.11)
RDW: 14.5 % (ref 11.5–15.5)
WBC: 16.5 10*3/uL — ABNORMAL HIGH (ref 4.0–10.5)
nRBC: 0 % (ref 0.0–0.2)

## 2019-07-12 LAB — BASIC METABOLIC PANEL
Anion gap: 9 (ref 5–15)
BUN: 29 mg/dL — ABNORMAL HIGH (ref 8–23)
CO2: 34 mmol/L — ABNORMAL HIGH (ref 22–32)
Calcium: 8.4 mg/dL — ABNORMAL LOW (ref 8.9–10.3)
Chloride: 97 mmol/L — ABNORMAL LOW (ref 98–111)
Creatinine, Ser: 0.96 mg/dL (ref 0.44–1.00)
GFR calc Af Amer: >60 mL/min (ref >60)
GFR calc non Af Amer: >60 mL/min (ref >60)
Glucose, Bld: 116 mg/dL — ABNORMAL HIGH (ref 70–99)
Potassium: 4.2 mmol/L (ref 3.5–5.1)
Sodium: 140 mmol/L (ref 135–145)

## 2019-07-12 LAB — BLOOD GAS, ARTERIAL
Acid-Base Excess: 11.2 mmol/L — ABNORMAL HIGH (ref 0.0–2.0)
Bicarbonate: 31.8 mmol/L — ABNORMAL HIGH (ref 20.0–28.0)
FIO2: 36
O2 Saturation: 95.8 %
Patient temperature: 37
pCO2 arterial: 97.5 mmHg (ref 32.0–48.0)
pH, Arterial: 7.225 — ABNORMAL LOW (ref 7.350–7.450)
pO2, Arterial: 98.3 mmHg (ref 83.0–108.0)

## 2019-07-12 MED ORDER — PROPOFOL 10 MG/ML IV BOLUS
0.5000 mg/kg | Freq: Once | INTRAVENOUS | Status: AC
  Administered 2019-07-12: 65.8 mg via INTRAVENOUS
  Filled 2019-07-12: qty 20

## 2019-07-12 MED ORDER — SODIUM CHLORIDE 0.9 % IV SOLN
1000.0000 mL | INTRAVENOUS | Status: DC
  Administered 2019-07-12 – 2019-07-15 (×8): 1000 mL via INTRAVENOUS

## 2019-07-12 MED ORDER — SODIUM CHLORIDE 0.9 % IV BOLUS (SEPSIS)
500.0000 mL | Freq: Once | INTRAVENOUS | Status: AC
  Administered 2019-07-12: 500 mL via INTRAVENOUS

## 2019-07-12 NOTE — ED Provider Notes (Signed)
[facility] EMERGENCY DEPARTMENT Provider Note   CSN: [id number] Arrival date & time: 07/12/19  2046     History Chief Complaint  Patient presents with  . Tachycardia    Carolyn Silva is a 69 y.o. female.  HPI   Patient presents ED for evaluation of tachycardia and respiratory difficulty.  Patient was recently admitted to hospital on May 25 and discharged on May 27.  Patient was admitted for new onset atrial fibrillation at that time.  Patient was discharged home with oxygen.  Plan was for her to have a sleep study.  Patient was cardioverted during her hospitalization.  She was discharged on apixaban.  Patient's daughter who is a respiratory therapist went to check on her mother today after work.  She noted that she seemed to be having some respiratory difficulty.  She placed her oxygen saturation monitor on her and noted that the patient was tachycardic and her oxygen saturations were decreased.  The patient had not noticed that she was in recurrent A. fib.  She denies any fevers or chills.  Past Medical History:  Diagnosis Date  . Asthma   . Hypertension     Patient Active Problem List   Diagnosis Date Noted  . Hypoxia   . Asthma exacerbation 07/09/2019  . Atrial fibrillation ([place]) 07/09/2019  . Essential hypertension 07/09/2019  . Special screening for malignant neoplasms, colon     Past Surgical History:  Procedure Laterality Date  . CARDIOVERSION N/A 07/11/2019   Procedure: CARDIOVERSION;  Surgeon: Satira Sark, MD;  Location: [facility];  Service: Cardiovascular;  Laterality: N/A;  . COLONOSCOPY N/A 09/14/2017   Procedure: COLONOSCOPY;  Surgeon: Danie Binder, MD;  Location: [facility] SUITE;  Service: Endoscopy;  Laterality: N/A;  2:00  . TEE WITHOUT CARDIOVERSION N/A 07/11/2019   Procedure: TRANSESOPHAGEAL ECHOCARDIOGRAM (TEE) WITH PROPOFOL;  Surgeon: Satira Sark, MD;  Location: [facility];  Service: Cardiovascular;  Laterality: N/A;  . TUBAL LIGATION        OB History    Gravida      Para      Term      Preterm      AB      Living  5     SAB      TAB      Ectopic      Multiple      Live Births              Family History  Problem Relation Age of Onset  . Diabetes Mother   . Cirrhosis Father   . Diabetes Brother   . Kidney disease Sister   . Colon cancer Neg Hx   . Colon polyps Neg Hx     Social History   Tobacco Use  . Smoking status: Never Smoker  . Smokeless tobacco: Never Used  Substance Use Topics  . Alcohol use: Yes    Comment: Occasional  . Drug use: No    Home Medications Prior to Admission medications   Medication Sig Start Date End Date Taking? Authorizing Provider  albuterol (VENTOLIN HFA) 108 (90 Base) MCG/ACT inhaler Inhale 1-2 puffs into the lungs every 6 (six) hours as needed for wheezing or shortness of breath. 07/11/19   Johnson, Clanford L, MD  ELIQUIS 5 MG TABS tablet Take 5 mg by mouth 2 (two) times daily. 07/02/19   [provider]  predniSONE (DELTASONE) 20 MG tablet Take 2 PO QAM x3days, 1 PO QAM x3days 07/12/19  Murlean Iba, MD    Allergies    Strawberry c [ascorbic acid]  Review of Systems   Review of Systems  All other systems reviewed and are negative.   Physical Exam Updated Vital Signs BP (!) 87/54   Pulse 83   Temp 99.1 F (37.3 C)   Resp 18   Ht 1.676 m (5\' 6" )   Wt 130.6 kg   SpO2 98%   BMI 46.48 kg/m   Physical Exam Vitals and nursing note reviewed.  Constitutional:      Appearance: She is well-developed. She is not toxic-appearing or diaphoretic.  HENT:     Head: Normocephalic and atraumatic.     Right Ear: External ear normal.     Left Ear: External ear normal.  Eyes:     General: No scleral icterus.       Right eye: No discharge.        Left eye: No discharge.     Conjunctiva/sclera: Conjunctivae normal.  Neck:     Trachea: No tracheal deviation.  Cardiovascular:     Rate and Rhythm: Regular rhythm. Tachycardia present.   Pulmonary:     Effort: Pulmonary effort is normal. No respiratory distress.     Breath sounds: Normal breath sounds. No stridor. No wheezing or rales.     Comments: No respiratory difficulty Abdominal:     General: Bowel sounds are normal. There is no distension.     Palpations: Abdomen is soft.     Tenderness: There is no abdominal tenderness. There is no guarding or rebound.  Musculoskeletal:        General: No tenderness.     Cervical back: Neck supple.  Skin:    General: Skin is warm and dry.     Findings: No rash.  Neurological:     Mental Status: She is alert.     Cranial Nerves: No cranial nerve deficit (no facial droop, extraocular movements intact, no slurred speech).     Sensory: No sensory deficit.     Motor: No abnormal muscle tone or seizure activity.     Coordination: Coordination normal.      ED Results / Procedures / Treatments   Labs (all labs ordered are listed, but only abnormal results are displayed) Labs Reviewed  CBC WITH DIFFERENTIAL/PLATELET - Abnormal; Notable for the following components:      Result Value   WBC 16.5 (*)    Hemoglobin 11.8 (*)    MCHC 28.5 (*)    Neutro Abs 14.0 (*)    Monocytes Absolute 1.4 (*)    Abs Immature Granulocytes 0.09 (*)    All other components within normal limits  BASIC METABOLIC PANEL - Abnormal; Notable for the following components:   Chloride 97 (*)    CO2 34 (*)    Glucose, Bld 116 (*)    BUN 29 (*)    Calcium 8.4 (*)    All other components within normal limits  BLOOD GAS, ARTERIAL - Abnormal; Notable for the following components:   pH, Arterial 7.225 (*)    pCO2 arterial 97.5 (*)    Bicarbonate 31.8 (*)    Acid-Base Excess 11.2 (*)    All other components within normal limits  SARS CORONAVIRUS 2 BY RT PCR [facility] ORDER, [place] [facility])    EKG EKG Interpretation  Date/Time:  Friday Jul 12 2019 22:03:34 EDT Ventricular Rate:  94 PR Interval:    QRS Duration: 76 QT  Interval:  355  QTC Calculation: 444 R Axis:   6 Text Interpretation: Normal sinus rhythm Baseline wander in lead(s) V6 atrial flutter resolved Confirmed by Dorie Rank [phone]) on 07/12/2019 10:26:30 PM  1st EKG Atrial flutter 2:1 A-V conduction Cannot rule out Anterior infarct , age undetermined ST & T wave abnormality, consider lateral ischemia Abnormal ECG Since last tracing rate faster , atrial flutter has replaced a fib Confirmed by Dorie Rank [phone]) on 07/12/2019 9:11:26 PM  Radiology DG Chest Portable 1 View  Result Date: 07/12/2019 CLINICAL DATA:  69 year old female with heart flutter. EXAM: PORTABLE CHEST 1 VIEW COMPARISON:  Chest CT dated 07/09/2019. FINDINGS: Left lung base density may represent atelectasis. No consolidative changes. There is no large pleural effusion or pneumothorax. There is mild cardiomegaly. There is prominence of the central pulmonary vasculature which may represent a degree of pulmonary hypertension. No acute osseous pathology. IMPRESSION: Mild cardiomegaly with findings suggestive of pulmonary hypertension. No focal consolidation. Electronically Signed   By: Anner Crete M.D.   On: 07/12/2019 21:55   ECHO TEE  Result Date: 07/11/2019    TRANSESOPHOGEAL ECHO REPORT   Patient Name:   Ethelda Chick Date of Exam: 07/11/2019 Medical Rec #:  [id number]         Height:       66.0 in Accession #:    [id number]        Weight:       289.9 lb Date of Birth:  [birth year]          BSA:          2.342 m Patient Age:    56 years          BP:           104/53 mmHg Patient Gender: F                 HR:           87 bpm. Exam Location:  [facility] Procedure: Transesophageal Echo, Cardiac Doppler and Color Doppler Indications:     Atrial fibrillation  History:         Patient has prior history of Echocardiogram examinations, most                  recent 07/10/2019. Arrythmias:Atrial Fibrillation,                  Signs/Symptoms:Shortness of Breath; Risk Factors:Hypertension.   Sonographer:     Dustin Flock RDCS Referring Phys:  [id number] Erma Heritage Diagnosing Phys: Rozann Lesches MD PROCEDURE: After discussion of the risks and benefits of a TEE, an informed consent was obtained. TEE procedure time was 5 minutes. The transesophogeal probe was passed without difficulty through the esophogus of the patient. Imaged were obtained with the patient in a left lateral decubitus position. Local oropharyngeal anesthetic was provided with Cetacaine. Sedation performed by different physician. The patient was monitored while under deep sedation. Anesthestetic sedation was provided intravenously by Anesthesiology: 147.94mg  of Propofol. Image quality was good. The patient's vital signs; including heart rate, blood pressure, and oxygen saturation; remained stable throughout the procedure. The patient developed no complications during  the procedure. IMPRESSIONS  1. Left ventricular ejection fraction, by estimation, is 65 to 70%. The left ventricle has normal function. The left ventricle has no regional wall motion abnormalities. There is mild left ventricular hypertrophy.  2. Right ventricular systolic function is normal. The right ventricular size is normal.  3. No left atrial/left atrial appendage thrombus  was detected. The LAA emptying velocity was 40 cm/s.  4. The mitral valve is grossly normal. Trivial mitral valve regurgitation.  5. The aortic valve is tricuspid. Aortic valve regurgitation is not visualized.  6. Evidence of atrial level shunting detected by color flow Doppler. There is a small atrial septal defect with predominantly left to right shunting across the atrial septum. FINDINGS  Left Ventricle: Left ventricular ejection fraction, by estimation, is 65 to 70%. The left ventricle has normal function. The left ventricle has no regional wall motion abnormalities. The left ventricular internal cavity size was normal in size. There is  mild left ventricular hypertrophy. Right  Ventricle: The right ventricular size is normal. No increase in right ventricular wall thickness. Right ventricular systolic function is normal. Left Atrium: Left atrial size was normal in size. No left atrial/left atrial appendage thrombus was detected. The LAA emptying velocity was 40 cm/s. Right Atrium: Right atrial size was normal in size. Pericardium: The pericardium was not assessed. Mitral Valve: The mitral valve is grossly normal. Trivial mitral valve regurgitation. Tricuspid Valve: The tricuspid valve is grossly normal. Tricuspid valve regurgitation is mild. Aortic Valve: The aortic valve is tricuspid. Aortic valve regurgitation is not visualized. Pulmonic Valve: The pulmonic valve was not well visualized. Pulmonic valve regurgitation is not visualized. Aorta: The aortic root is normal in size and structure. There is minimal (Grade I) plaque involving the descending aorta. IAS/Shunts: Evidence of atrial level shunting detected by color flow Doppler. There is a small atrial septal defect with predominantly left to right shunting across the atrial septum. Rozann Lesches MD Electronically signed by Rozann Lesches MD Signature Date/Time: 07/11/2019/12:44:03 PM    Final     Procedures .Sedation  Date/Time: 07/12/2019 10:43 PM Performed by: Dorie Rank, MD Authorized by: Dorie Rank, MD   Consent:    Consent obtained:  Verbal   Consent given by:  Patient   Risks discussed:  Allergic reaction, dysrhythmia, inadequate sedation, nausea, prolonged hypoxia resulting in organ damage, prolonged sedation necessitating reversal, respiratory compromise necessitating ventilatory assistance and intubation and vomiting   Alternatives discussed:  Analgesia without sedation, anxiolysis and regional anesthesia Universal protocol:    Procedure explained and questions answered to patient or proxy's satisfaction: yes     Relevant documents present and verified: yes     Test results available and properly labeled:  yes     Imaging studies available: yes     Required blood products, implants, devices, and special equipment available: yes     Site/side marked: yes     Immediately prior to procedure a time out was called: yes     Patient identity confirmation method:  Verbally with patient Indications:    Procedure necessitating sedation performed by:  Physician performing sedation Pre-sedation assessment:    Time since last food or drink:  6   ASA classification: class 2 - patient with mild systemic disease     Neck mobility: normal     Mouth opening:  2 finger widths   Thyromental distance:  3 finger widths   Mallampati score:  II - soft palate, uvula, fauces visible   Pre-sedation assessments completed and reviewed: airway patency, cardiovascular function, hydration status, mental status, nausea/vomiting, pain level, respiratory function and temperature     Pre-sedation assessment completed:  07/12/2019 10:08 PM Immediate pre-procedure details:    Reassessment: Patient reassessed immediately prior to procedure     Reviewed: vital signs, relevant labs/tests and NPO status     Verified: bag  valve mask available, emergency equipment available, intubation equipment available, IV patency confirmed, oxygen available and suction available   Procedure details (see MAR for exact dosages):    Preoxygenation:  Nasal cannula   Sedation:  Propofol   Intended level of sedation: deep   Intra-procedure monitoring:  Blood pressure monitoring, cardiac monitor, continuous pulse oximetry, frequent LOC assessments, frequent vital sign checks and continuous capnometry   Intra-procedure events: none     Intra-procedure management:  Airway repositioning, BVM ventilation and supplemental oxygen   Total Provider sedation time (minutes):  15 Post-procedure details:    Post-sedation assessment completed:  07/12/2019 10:43 PM   Attendance: Constant attendance by certified staff until patient recovered     Recovery: Patient  returned to pre-procedure baseline     Post-sedation assessments completed and reviewed: airway patency, cardiovascular function, hydration status, mental status, nausea/vomiting, pain level, respiratory function and temperature     Patient is stable for discharge or admission: yes     Patient tolerance:  Tolerated well, no immediate complications .Cardioversion  Date/Time: 07/12/2019 10:44 PM Performed by: Dorie Rank, MD Authorized by: Dorie Rank, MD   Consent:    Consent obtained:  Verbal   Consent given by:  Patient   Alternatives discussed:  No treatment Pre-procedure details:    Rhythm:  Atrial flutter   Electrode placement:  Anterior-posterior Patient sedated: Yes. Refer to sedation procedure documentation for details of sedation.  Attempt one:    Cardioversion mode:  Synchronous   Waveform:  Biphasic   Shock (Joules):  150   Shock outcome:  Conversion to normal sinus rhythm Post-procedure details:    Patient status:  Alert   Patient tolerance of procedure:  Tolerated well, no immediate complications .Critical Care Performed by: Dorie Rank, MD Authorized by: Dorie Rank, MD   Critical care provider statement:    Critical care time (minutes):  30   Critical care was time spent personally by me on the following activities:  Discussions with consultants, evaluation of patient's response to treatment, examination of patient, ordering and performing treatments and interventions, ordering and review of laboratory studies, ordering and review of radiographic studies, pulse oximetry, re-evaluation of patient's condition, obtaining history from patient or surrogate and review of old charts   (including critical care time)  Medications Ordered in ED Medications  sodium chloride 0.9 % bolus 500 mL (has no administration in time range)    Followed by  0.9 %  sodium chloride infusion (has no administration in time range)  propofol (DIPRIVAN) 10 mg/mL bolus/IV push 65.8 mg (65.8 mg  Intravenous Given 07/12/19 2157)    ED Course  I have reviewed the triage vital signs and the nursing notes.  Pertinent labs & imaging results that were available during my care of the patient were reviewed by me and considered in my medical decision making (see chart for details).  Clinical Course as of Jul 11 2320  Fri Jul 12, 2019  2245 Labs are notable for respiratory acidosis.  Note this was performed during her procedural sedation.  Pt was noted to have end tidal pco2 in the 70s prior to the sedation   [JK]  2246 Chest x-ray showed mild cardiomegaly.   [id number] Pt remains awake and alert   [JK]    Clinical Course User Index [JK] Dorie Rank, MD   MDM Rules/Calculators/A&P                       MDM  Number of Diagnoses or Management Options   Amount and/or Complexity of Data Reviewed Clinical lab tests: ordered and reviewed Tests in the radiology section of CPT: ordered and reviewed Tests in the medicine section of CPT: ordered and reviewed Discussion of test results with the performing providers: yes Decide to obtain previous medical records or to obtain history from someone other than the patient: yes Obtain history from someone other than the patient: yes Review and summarize past medical records: yes Discuss the patient with other providers: yes Independent visualization of images, tracings, or specimens: yes  Risk of Complications, Morbidity, and/or Mortality Presenting problems: high Diagnostic procedures: high Management options: high  Patient Progress Patient progress: improved   Patient presented to the ED for evaluation of some respiratory difficulty and recurrent atrial flutter. She was just in the hospital for similar symptoms. In the ED the patient was noted to be tachycardic with heart rate in the 140s. She did have borderline blood pressures and was noted to be significantly tachycardic. Patient underwent cardioversion in the ED. Patient responded  successfully to this and remained back in sinus rhythm. However prior to the procedure the patient was noted to have an elevated end-tidal CO2 in the 70s. Patient did undergo propofol sedation. She did have increasing end-tidal CO2's during the procedure so an ABG was done. Patient did have respiratory acidosis. Patient was started on BiPAP. She has remained stable. I suspect there is a chronic component of hypercapnia considering the elevated PCO2. Some of the acute hypercapnia is related to the procedure however I do think she would benefit from continued BiPAP, overnight monitoring and repeat ABG. At this time no signs of pneumonia or sepsis. Patient has been fully vaccinated against Covid.   Final Clinical Impression(s) / ED Diagnoses Final diagnoses:  Atrial flutter with rapid ventricular response ([place])  Hypercapnic acidosis     Dorie Rank, MD 07/12/19 2323

## 2019-07-12 NOTE — ED Notes (Signed)
Date and time results received: 07/12/19 2224  Test: PCO2 Critical Value: 97.5   Name of Provider Notified: Dr. Tomi Bamberger  Orders Received? Or Actions Taken?: Bipap ordered, respiratory notified.

## 2019-07-12 NOTE — ED Triage Notes (Signed)
Pt c/o SOB. Pt was just d/c'd yesterday with O2. Pt HR 155 and O2 84% on 2LPM. Pt increased to 5LPM, O2 increased 90%.

## 2019-07-12 NOTE — Progress Notes (Signed)
RT present for cardioversion. Patient was bagged with ambu bag for approx 1 min until she started to regain consciousness then placed back on 4 lpm nasal cannula. Bilateral breath sounds were shallow but clear. ABG obtained due to CO2 alarm reading in the 70s-80s. Patient waking up when RT left room to take sample to lab.

## 2019-07-13 DIAGNOSIS — Z841 Family history of disorders of kidney and ureter: Secondary | ICD-10-CM | POA: Diagnosis not present

## 2019-07-13 DIAGNOSIS — J9611 Chronic respiratory failure with hypoxia: Secondary | ICD-10-CM | POA: Diagnosis not present

## 2019-07-13 DIAGNOSIS — E872 Acidosis: Secondary | ICD-10-CM | POA: Diagnosis not present

## 2019-07-13 DIAGNOSIS — R0602 Shortness of breath: Secondary | ICD-10-CM | POA: Diagnosis not present

## 2019-07-13 DIAGNOSIS — I4891 Unspecified atrial fibrillation: Secondary | ICD-10-CM | POA: Diagnosis not present

## 2019-07-13 DIAGNOSIS — Z20822 Contact with and (suspected) exposure to covid-19: Secondary | ICD-10-CM | POA: Diagnosis present

## 2019-07-13 DIAGNOSIS — G4733 Obstructive sleep apnea (adult) (pediatric): Secondary | ICD-10-CM | POA: Diagnosis not present

## 2019-07-13 DIAGNOSIS — J9602 Acute respiratory failure with hypercapnia: Secondary | ICD-10-CM | POA: Diagnosis present

## 2019-07-13 DIAGNOSIS — Z6841 Body Mass Index (BMI) 40.0 and over, adult: Secondary | ICD-10-CM | POA: Diagnosis not present

## 2019-07-13 DIAGNOSIS — J9622 Acute and chronic respiratory failure with hypercapnia: Secondary | ICD-10-CM | POA: Diagnosis present

## 2019-07-13 DIAGNOSIS — D72829 Elevated white blood cell count, unspecified: Secondary | ICD-10-CM | POA: Diagnosis present

## 2019-07-13 DIAGNOSIS — I501 Left ventricular failure: Secondary | ICD-10-CM | POA: Diagnosis present

## 2019-07-13 DIAGNOSIS — K219 Gastro-esophageal reflux disease without esophagitis: Secondary | ICD-10-CM | POA: Diagnosis present

## 2019-07-13 DIAGNOSIS — J9612 Chronic respiratory failure with hypercapnia: Secondary | ICD-10-CM | POA: Diagnosis not present

## 2019-07-13 DIAGNOSIS — Z7901 Long term (current) use of anticoagulants: Secondary | ICD-10-CM | POA: Diagnosis not present

## 2019-07-13 DIAGNOSIS — I4892 Unspecified atrial flutter: Secondary | ICD-10-CM | POA: Diagnosis not present

## 2019-07-13 DIAGNOSIS — E662 Morbid (severe) obesity with alveolar hypoventilation: Secondary | ICD-10-CM | POA: Diagnosis present

## 2019-07-13 DIAGNOSIS — I110 Hypertensive heart disease with heart failure: Secondary | ICD-10-CM | POA: Diagnosis present

## 2019-07-13 DIAGNOSIS — J449 Chronic obstructive pulmonary disease, unspecified: Secondary | ICD-10-CM | POA: Diagnosis present

## 2019-07-13 DIAGNOSIS — J9621 Acute and chronic respiratory failure with hypoxia: Secondary | ICD-10-CM | POA: Diagnosis present

## 2019-07-13 DIAGNOSIS — Z833 Family history of diabetes mellitus: Secondary | ICD-10-CM | POA: Diagnosis not present

## 2019-07-13 LAB — COMPREHENSIVE METABOLIC PANEL
ALT: 40 U/L (ref 0–44)
AST: 29 U/L (ref 15–41)
Albumin: 3.4 g/dL — ABNORMAL LOW (ref 3.5–5.0)
Alkaline Phosphatase: 77 U/L (ref 38–126)
Anion gap: 9 (ref 5–15)
BUN: 28 mg/dL — ABNORMAL HIGH (ref 8–23)
CO2: 35 mmol/L — ABNORMAL HIGH (ref 22–32)
Calcium: 8.3 mg/dL — ABNORMAL LOW (ref 8.9–10.3)
Chloride: 97 mmol/L — ABNORMAL LOW (ref 98–111)
Creatinine, Ser: 0.86 mg/dL (ref 0.44–1.00)
GFR calc Af Amer: >60 mL/min (ref >60)
GFR calc non Af Amer: >60 mL/min (ref >60)
Glucose, Bld: 120 mg/dL — ABNORMAL HIGH (ref 70–99)
Potassium: 4.7 mmol/L (ref 3.5–5.1)
Sodium: 141 mmol/L (ref 135–145)
Total Bilirubin: 0.5 mg/dL (ref 0.3–1.2)
Total Protein: 6.7 g/dL (ref 6.5–8.1)

## 2019-07-13 LAB — BLOOD GAS, ARTERIAL
Acid-Base Excess: 10.5 mmol/L — ABNORMAL HIGH (ref 0.0–2.0)
Acid-Base Excess: 11.6 mmol/L — ABNORMAL HIGH (ref 0.0–2.0)
Acid-Base Excess: 7.2 mmol/L — ABNORMAL HIGH (ref 0.0–2.0)
Bicarbonate: 31.3 mmol/L — ABNORMAL HIGH (ref 20.0–28.0)
Bicarbonate: 33 mmol/L — ABNORMAL HIGH (ref 20.0–28.0)
Bicarbonate: 29.7 mmol/L — ABNORMAL HIGH (ref 20.0–28.0)
FIO2: 35
FIO2: 35
FIO2: 35
O2 Saturation: 95.9 %
O2 Saturation: 97.5 %
O2 Saturation: 99.2 %
Patient temperature: 37
Patient temperature: 37
Patient temperature: 36.6
pCO2 arterial: 96.3 mmHg (ref 32.0–48.0)
pCO2 arterial: 89.3 mmHg (ref 32.0–48.0)
pCO2 arterial: 70.7 mmHg (ref 32.0–48.0)
pH, Arterial: 7.222 — ABNORMAL LOW (ref 7.350–7.450)
pH, Arterial: 7.261 — ABNORMAL LOW (ref 7.350–7.450)
pH, Arterial: 7.297 — ABNORMAL LOW (ref 7.350–7.450)
pO2, Arterial: 96.3 mmHg (ref 83.0–108.0)
pO2, Arterial: 110 mmHg — ABNORMAL HIGH (ref 83.0–108.0)
pO2, Arterial: 140 mmHg — ABNORMAL HIGH (ref 83.0–108.0)

## 2019-07-13 LAB — CBC
HCT: 42.4 % (ref 36.0–46.0)
Hemoglobin: 11.6 g/dL — ABNORMAL LOW (ref 12.0–15.0)
MCH: 27 pg (ref 26.0–34.0)
MCHC: 27.4 g/dL — ABNORMAL LOW (ref 30.0–36.0)
MCV: 98.6 fL (ref 80.0–100.0)
Platelets: 260 10*3/uL (ref 150–400)
RBC: 4.3 MIL/uL (ref 3.87–5.11)
RDW: 14.3 % (ref 11.5–15.5)
WBC: 15.4 10*3/uL — ABNORMAL HIGH (ref 4.0–10.5)
nRBC: 0 % (ref 0.0–0.2)

## 2019-07-13 LAB — MRSA PCR SCREENING: MRSA by PCR: NEGATIVE

## 2019-07-13 LAB — SARS CORONAVIRUS 2 BY RT PCR (HOSPITAL ORDER, PERFORMED IN ~~LOC~~ HOSPITAL LAB): SARS Coronavirus 2: NEGATIVE

## 2019-07-13 MED ORDER — ONDANSETRON HCL 4 MG PO TABS: 4.0000 mg | ORAL_TABLET | Freq: Four times a day (QID) | ORAL | Status: DC | PRN

## 2019-07-13 MED ORDER — IPRATROPIUM BROMIDE 0.02 % IN SOLN
0.5000 mg | Freq: Four times a day (QID) | RESPIRATORY_TRACT | Status: DC
  Administered 2019-07-13 – 2019-07-16 (×15): 0.5 mg via RESPIRATORY_TRACT
  Filled 2019-07-13 (×15): qty 2.5

## 2019-07-13 MED ORDER — ORAL CARE MOUTH RINSE
15.0000 mL | Freq: Two times a day (BID) | OROMUCOSAL | Status: DC
  Administered 2019-07-13 – 2019-07-22 (×12): 15 mL via OROMUCOSAL

## 2019-07-13 MED ORDER — LEVALBUTEROL HCL 0.63 MG/3ML IN NEBU
0.6300 mg | INHALATION_SOLUTION | Freq: Four times a day (QID) | RESPIRATORY_TRACT | Status: DC
  Administered 2019-07-13 – 2019-07-16 (×15): 0.63 mg via RESPIRATORY_TRACT
  Filled 2019-07-13 (×15): qty 3

## 2019-07-13 MED ORDER — SODIUM CHLORIDE 0.9% FLUSH
3.0000 mL | Freq: Two times a day (BID) | INTRAVENOUS | Status: DC
  Administered 2019-07-13 – 2019-07-23 (×19): 3 mL via INTRAVENOUS

## 2019-07-13 MED ORDER — SODIUM CHLORIDE 0.9 % IV SOLN: 250.0000 mL | INTRAVENOUS | Status: DC | PRN

## 2019-07-13 MED ORDER — CHLORHEXIDINE GLUCONATE 0.12 % MT SOLN
15.0000 mL | Freq: Two times a day (BID) | OROMUCOSAL | Status: DC
  Administered 2019-07-13 – 2019-07-23 (×18): 15 mL via OROMUCOSAL
  Filled 2019-07-13 (×19): qty 15

## 2019-07-13 MED ORDER — APIXABAN 5 MG PO TABS
5.0000 mg | ORAL_TABLET | Freq: Two times a day (BID) | ORAL | Status: DC
  Administered 2019-07-13 – 2019-07-23 (×21): 5 mg via ORAL
  Filled 2019-07-13 (×21): qty 1

## 2019-07-13 MED ORDER — SODIUM CHLORIDE 0.9% FLUSH: 3.0000 mL | INTRAVENOUS | Status: DC | PRN

## 2019-07-13 MED ORDER — CHLORHEXIDINE GLUCONATE CLOTH 2 % EX PADS
6.0000 | MEDICATED_PAD | Freq: Every day | CUTANEOUS | Status: DC
  Administered 2019-07-13 – 2019-07-20 (×8): 6 via TOPICAL

## 2019-07-13 MED ORDER — ONDANSETRON HCL 4 MG/2ML IJ SOLN: 4.0000 mg | Freq: Four times a day (QID) | INTRAMUSCULAR | Status: DC | PRN

## 2019-07-13 MED ORDER — ACETAMINOPHEN 650 MG RE SUPP: 650.0000 mg | Freq: Four times a day (QID) | RECTAL | Status: DC | PRN

## 2019-07-13 MED ORDER — ACETAMINOPHEN 325 MG PO TABS
650.0000 mg | ORAL_TABLET | Freq: Four times a day (QID) | ORAL | Status: DC | PRN
  Administered 2019-07-15 – 2019-07-17 (×3): 650 mg via ORAL
  Filled 2019-07-13 (×3): qty 2

## 2019-07-13 NOTE — H&P (Signed)
TRH H&P    Patient Demographics:    Carolyn Silva, is a 69 y.o. female  MRN: [id number]  DOB - 1950-08-15  Admit Date - 07/12/2019  Referring MD/NP/PA: Marye Round  Outpatient Primary MD for the patient is Celene Squibb, MD  Patient coming from: Home  Chief complaint-shortness of breath   HPI:    Carolyn Silva  is a 69 y.o. female, with history of asthma, hypertension, newly diagnosed atrial fibrillation on anticoagulation with Eliquis was brought to the ED with complaints of tachycardia and respiratory difficulty.  Patient was discharged from hospital on May 27 after she was diagnosed with new onset A. fib and underwent successful cardioversion.  Patient was discharged on apixaban.  Patient's daughter is a respiratory therapist and went on to check her mother today.  Patient seem to have respiratory difficulty.  She placed her on oxygen saturation monitor and noted the patient's was tachycardic with O2 sats decreased. In the ED patient was found to be in A. fib with RVR with heart rate 140s.  Patient underwent electrical cardioversion in the ED and tolerated procedure well.  However she was noted to have end-tidal CO2 in the 70s.  Patient underwent propofol sedation.  ABG showed PCO2 of 97.  Patient was alert and oriented x3.  She was placed on BiPAP for acute respiratory acidosis. At this time patient is on BiPAP, denies any chest pain.  Denies nausea or vomiting.  No history of abdominal pain or dysuria.    Review of systems:    In addition to the HPI above,    All other systems reviewed and are negative.    Past History of the following :    Past Medical History:  Diagnosis Date  . Asthma   . Hypertension       Past Surgical History:  Procedure Laterality Date  . CARDIOVERSION N/A 07/11/2019   Procedure: CARDIOVERSION;  Surgeon: Satira Sark, MD;  Location: [facility];  Service:  Cardiovascular;  Laterality: N/A;  . COLONOSCOPY N/A 09/14/2017   Procedure: COLONOSCOPY;  Surgeon: Danie Binder, MD;  Location: [facility];  Service: Endoscopy;  Laterality: N/A;  2:00  . TEE WITHOUT CARDIOVERSION N/A 07/11/2019   Procedure: TRANSESOPHAGEAL ECHOCARDIOGRAM (TEE) WITH PROPOFOL;  Surgeon: Satira Sark, MD;  Location: [facility];  Service: Cardiovascular;  Laterality: N/A;  . TUBAL LIGATION        Social History:      Social History   Tobacco Use  . Smoking status: Never Smoker  . Smokeless tobacco: Never Used  Substance Use Topics  . Alcohol use: Yes    Comment: Occasional       Family History :     Family History  Problem Relation Age of Onset  . Diabetes Mother   . Cirrhosis Father   . Diabetes Brother   . Kidney disease Sister   . Colon cancer Neg Hx   . Colon polyps Neg Hx       Home Medications:   Prior to Admission medications  Medication Sig Start Date End Date Taking? Authorizing Provider  albuterol (VENTOLIN HFA) 108 (90 Base) MCG/ACT inhaler Inhale 1-2 puffs into the lungs every 6 (six) hours as needed for wheezing or shortness of breath. 07/11/19   Johnson, Clanford L, MD  ELIQUIS 5 MG TABS tablet Take 5 mg by mouth 2 (two) times daily. 07/02/19   [provider]  predniSONE (DELTASONE) 20 MG tablet Take 2 PO QAM x3days, 1 PO QAM x3days 07/12/19   Murlean Iba, MD     Allergies:     Allergies  Allergen Reactions  . Strawberry C [Ascorbic Acid] Hives     Physical Exam:   Vitals  Blood pressure 108/61, pulse 67, temperature 99.1 F (37.3 C), resp. rate (!) 21, height 5\' 6"  (1.676 m), weight 130.6 kg, SpO2 96 %.  1.  General: Appears in no acute distress  2. Psychiatric: Alert, oriented x3, on BiPAP  3. Neurologic: Cranial nerves II through XII grossly intact, moving all extremities, no focal deficit noted  4. HEENMT:  Atraumatic normocephalic, extraocular muscles are intact  5. Respiratory  : Decreased breath sounds bilaterally  6. Cardiovascular : S1-S2, regular, no murmur auscultated  7. Gastrointestinal:  Abdomen is soft, nontender, no organomegaly      Data Review:    CBC Recent Labs  Lab 07/09/19 1253 07/11/19 0610 07/12/19 2128  WBC 11.3* 18.4* 16.5*  HGB 12.0 12.3 11.8*  HCT 40.2 44.1 41.4  PLT 253 291 262  MCV 91.4 96.3 96.7  MCH 27.3 26.9 27.6  MCHC 29.9* 27.9* 28.5*  RDW 14.4 14.6 14.5  LYMPHSABS 1.2 0.6* 1.0  MONOABS 0.5 0.8 1.4*  EOSABS 0.1 0.0 0.0  BASOSABS 0.0 0.0 0.0   ------------------------------------------------------------------------------------------------------------------  Results for orders placed or performed during the hospital encounter of 07/12/19 (from the past 48 hour(s))  CBC with Differential/Platelet     Status: Abnormal   Collection Time: 07/12/19  9:28 PM  Result Value Ref Range   WBC 16.5 (H) 4.0 - 10.5 K/uL   RBC 4.28 3.87 - 5.11 MIL/uL   Hemoglobin 11.8 (L) 12.0 - 15.0 g/dL   HCT 41.4 36.0 - 46.0 %   MCV 96.7 80.0 - 100.0 fL   MCH 27.6 26.0 - 34.0 pg   MCHC 28.5 (L) 30.0 - 36.0 g/dL   RDW 14.5 11.5 - 15.5 %   Platelets 262 150 - 400 K/uL   nRBC 0.0 0.0 - 0.2 %   Neutrophils Relative % 85 %   Neutro Abs 14.0 (H) 1.7 - 7.7 K/uL   Lymphocytes Relative 6 %   Lymphs Abs 1.0 0.7 - 4.0 K/uL   Monocytes Relative 8 %   Monocytes Absolute 1.4 (H) 0.1 - 1.0 K/uL   Eosinophils Relative 0 %   Eosinophils Absolute 0.0 0.0 - 0.5 K/uL   Basophils Relative 0 %   Basophils Absolute 0.0 0.0 - 0.1 K/uL   Immature Granulocytes 1 %   Abs Immature Granulocytes 0.09 (H) 0.00 - 0.07 K/uL    Comment: Performed at [facility], [address], [place], [place] [id number]  Basic metabolic panel     Status: Abnormal   Collection Time: 07/12/19  9:28 PM  Result Value Ref Range   Sodium 140 135 - 145 mmol/L   Potassium 4.2 3.5 - 5.1 mmol/L   Chloride 97 (L) 98 - 111 mmol/L   CO2 34 (H) 22 - 32 mmol/L   Glucose, Bld 116 (H) 70 -  99 mg/dL  Comment: Glucose reference range applies only to samples taken after fasting for at least 8 hours.   BUN 29 (H) 8 - 23 mg/dL   Creatinine, Ser 0.96 0.44 - 1.00 mg/dL   Calcium 8.4 (L) 8.9 - 10.3 mg/dL   GFR calc non Af Amer >60 >60 mL/min   GFR calc Af Amer >60 >60 mL/min   Anion gap 9 5 - 15    Comment: Performed at [facility], [address], [place], [place] [address]  Blood gas, arterial (at [facility])     Status: Abnormal   Collection Time: 07/12/19 10:07 PM  Result Value Ref Range   FIO2 36.00    pH, Arterial 7.225 (L) 7.350 - 7.450   pCO2 arterial 97.5 (HH) 32.0 - 48.0 mmHg    Comment: CRITICAL RESULT CALLED TO, READ BACK BY AND VERIFIED WITH: OAKLEY,B ON 07/12/19 AT 2220 BY LOY,C    pO2, Arterial 98.3 83.0 - 108.0 mmHg   Bicarbonate 31.8 (H) 20.0 - 28.0 mmol/L   Acid-Base Excess 11.2 (H) 0.0 - 2.0 mmol/L   O2 Saturation 95.8 %   Patient temperature 37.0    Allens test (pass/fail) PASS PASS    Comment: Performed at [facility], [address], [place], [place] [address]  SARS Coronavirus 2 by RT PCR (hospital order, performed in [place] hospital lab) Nasopharyngeal Nasopharyngeal Swab     Status: None   Collection Time: 07/12/19 11:45 PM   Specimen: Nasopharyngeal Swab  Result Value Ref Range   SARS Coronavirus 2 NEGATIVE NEGATIVE    Comment: (NOTE) SARS-CoV-2 target nucleic acids are NOT DETECTED. The SARS-CoV-2 RNA is generally detectable in upper and lower respiratory specimens during the acute phase of infection. The lowest concentration of SARS-CoV-2 viral copies this assay can detect is 250 copies / mL. A negative result does not preclude SARS-CoV-2 infection and should not be used as the sole basis for treatment or other patient management decisions.  A negative result may occur with improper specimen collection / handling, submission of specimen other than nasopharyngeal swab, presence of viral mutation(s) within the areas targeted by this assay,  and inadequate number of viral copies (<250 copies / mL). A negative result must be combined with clinical observations, patient history, and epidemiological information. Fact Sheet for Patients:   StrictlyIdeas.no Fact Sheet for Healthcare Providers: BankingDealers.co.za This test is not yet approved or cleared  by the [place] FDA and has been authorized for detection and/or diagnosis of SARS-CoV-2 by FDA under an Emergency Use Authorization (EUA).  This EUA will remain in effect (meaning this test can be used) for the duration of the COVID-19 declaration under Section 564(b)(1) of the Act, 21 U.S.C. section 360bbb-3(b)(1), unless the authorization is terminated or revoked sooner. Performed at [facility], [address] [place], [place] [address]     Chemistries  Recent Labs  Lab 07/09/19 1253 07/11/19 0610 07/12/19 2128  NA 142 143 140  K 4.5 4.9 4.2  CL 100 99 97*  CO2 32 33* 34*  GLUCOSE 112* 144* 116*  BUN 8 17 29*  CREATININE 0.73 0.87 0.96  CALCIUM 9.6 8.8* 8.4*  MG 2.1 2.3  --       Imaging Results:    DG Chest Portable 1 View  Result Date: 07/12/2019 CLINICAL DATA:  69 year old female with heart flutter. EXAM: PORTABLE CHEST 1 VIEW COMPARISON:  Chest CT dated 07/09/2019. FINDINGS: Left lung base density may represent atelectasis. No consolidative changes. There is  no large pleural effusion or pneumothorax. There is mild cardiomegaly. There is prominence of the central pulmonary vasculature which may represent a degree of pulmonary hypertension. No acute osseous pathology. IMPRESSION: Mild cardiomegaly with findings suggestive of pulmonary hypertension. No focal consolidation. Electronically Signed   By: Anner Crete M.D.   On: 07/12/2019 21:55   ECHO TEE  Result Date: 07/11/2019    TRANSESOPHOGEAL ECHO REPORT   Patient Name:   Ethelda Chick Date of Exam: 07/11/2019 Medical Rec #:  [id number]          Height:       66.0 in Accession #:    [id number]        Weight:       289.9 lb Date of Birth:  [birth year]          BSA:          2.342 m Patient Age:    89 years          BP:           104/53 mmHg Patient Gender: F                 HR:           87 bpm. Exam Location:  [facility] Procedure: Transesophageal Echo, Cardiac Doppler and Color Doppler Indications:     Atrial fibrillation  History:         Patient has prior history of Echocardiogram examinations, most                  recent 07/10/2019. Arrythmias:Atrial Fibrillation,                  Signs/Symptoms:Shortness of Breath; Risk Factors:Hypertension.  Sonographer:     Dustin Flock RDCS Referring Phys:  [id number] Erma Heritage Diagnosing Phys: Rozann Lesches MD PROCEDURE: After discussion of the risks and benefits of a TEE, an informed consent was obtained. TEE procedure time was 5 minutes. The transesophogeal probe was passed without difficulty through the esophogus of the patient. Imaged were obtained with the patient in a left lateral decubitus position. Local oropharyngeal anesthetic was provided with Cetacaine. Sedation performed by different physician. The patient was monitored while under deep sedation. Anesthestetic sedation was provided intravenously by Anesthesiology: 147.94mg  of Propofol. Image quality was good. The patient's vital signs; including heart rate, blood pressure, and oxygen saturation; remained stable throughout the procedure. The patient developed no complications during  the procedure. IMPRESSIONS  1. Left ventricular ejection fraction, by estimation, is 65 to 70%. The left ventricle has normal function. The left ventricle has no regional wall motion abnormalities. There is mild left ventricular hypertrophy.  2. Right ventricular systolic function is normal. The right ventricular size is normal.  3. No left atrial/left atrial appendage thrombus was detected. The LAA emptying velocity was 40 cm/s.  4. The mitral valve is grossly  normal. Trivial mitral valve regurgitation.  5. The aortic valve is tricuspid. Aortic valve regurgitation is not visualized.  6. Evidence of atrial level shunting detected by color flow Doppler. There is a small atrial septal defect with predominantly left to right shunting across the atrial septum. FINDINGS  Left Ventricle: Left ventricular ejection fraction, by estimation, is 65 to 70%. The left ventricle has normal function. The left ventricle has no regional wall motion abnormalities. The left ventricular internal cavity size was normal in size. There is  mild left ventricular hypertrophy. Right Ventricle: The right ventricular size is normal. No increase  in right ventricular wall thickness. Right ventricular systolic function is normal. Left Atrium: Left atrial size was normal in size. No left atrial/left atrial appendage thrombus was detected. The LAA emptying velocity was 40 cm/s. Right Atrium: Right atrial size was normal in size. Pericardium: The pericardium was not assessed. Mitral Valve: The mitral valve is grossly normal. Trivial mitral valve regurgitation. Tricuspid Valve: The tricuspid valve is grossly normal. Tricuspid valve regurgitation is mild. Aortic Valve: The aortic valve is tricuspid. Aortic valve regurgitation is not visualized. Pulmonic Valve: The pulmonic valve was not well visualized. Pulmonic valve regurgitation is not visualized. Aorta: The aortic root is normal in size and structure. There is minimal (Grade I) plaque involving the descending aorta. IAS/Shunts: Evidence of atrial level shunting detected by color flow Doppler. There is a small atrial septal defect with predominantly left to right shunting across the atrial septum. Rozann Lesches MD Electronically signed by Rozann Lesches MD Signature Date/Time: 07/11/2019/12:44:03 PM    Final     My personal review of EKG: Rhythm atrial flutter   Assessment & Plan:    Active Problems:   Acute respiratory failure with hypercapnia  (HCC)   1. Acute respiratory with hypercapnia-likely induced by sedation with underlying COPD.  PCO2 was elevated 97.  She is placed on BiPAP.  Her mental status is back to baseline.  Continue with BiPAP, obtain ABG in a.m.  Patient likely has chronic CO2 retention due to underlying COPD. 2. Atrial flutter-patient was found to be atrial flutter reduced 1 block, underwent successful core electrocardioversion in the ED.  At this time she is back to normal sinus rhythm.  Continue anticoagulation with Eliquis. 3. Hypertension-BP is soft.  Patient is currently not on medications at home.   DVT Prophylaxis-Eliquis  AM Labs Ordered, also please review Full Orders  Family Communication: Admission, patients condition and plan of care including tests being ordered have been discussed with the patient  who indicate understanding and agree with the plan and Code Status.  Code Status: Full code  Admission status: Observation  Time spent in minutes : 60 minutes   Pollyann Roa S Emanuelle Bastos M.D

## 2019-07-13 NOTE — Progress Notes (Signed)
Pt's PCO2 resulted: 70.7 @ 1615, down from 89.3 @1156 . Dr. Dyann Kief made aware, new orders received to trial removal of Bipap and allow patient to eat. Will monitor.

## 2019-07-13 NOTE — Progress Notes (Signed)
Seen and examined.  Admitted after midnight secondary to shortness of breath and tachycardia.  Patient found on A. fib with RVR and successfully cardioverted in the ED.  Rate is currently controlled but further work-up also determined complaining of acute respiratory failure with hypercapnia CO2 in the 97 range, acidotic pH on ABG.  So far hemodynamically stable and tolerating BiPAP.  Please refer to H&P written by Dr. Darrick Meigs on 07/13/2019 for further info/details.  Plan: -Continue BiPAP -Repeat ABG in 4 hours -Patient with underlying obesity hypoventilation syndrome and most likely undiagnosed obstructive sleep apnea. -On recent admission she was referred for a sleep study as an outpatient. -Dyspnea component of A. fib most likely triggered by sleep apnea. -Will benefit of CPAP/BiPAP nightly. -Follow clinical response.   Barton Dubois MD [phone]

## 2019-07-13 NOTE — Progress Notes (Signed)
Patient had 10 second run of sinus tach, up to 148 bpm at rest. Returned to NSR rate 70's within 15 sec. Slightly hypotensive with SBP in the 90's. Hospitalist made aware. Will continue to monitor at this time.

## 2019-07-13 NOTE — Progress Notes (Signed)
CRITICAL VALUE ALERT  Critical Value:  PCO2 96.3  Date & Time Notied:  07/13/19 0615  Provider Notified: [place]  Orders Received/Actions taken: no new orders

## 2019-07-13 NOTE — Progress Notes (Signed)
Patient was alert and oriented when Blood Gas obtained. Suspect patients PCO2 runs high chronically with acidotic PH. Possible night time BiPAP/CPAP may be adventitious for her. She has never smoked has history of asthma.  Appears to have large stomach which most likely presses on diaphragm.

## 2019-07-14 ENCOUNTER — Encounter (HOSPITAL_COMMUNITY): Payer: Self-pay | Admitting: Internal Medicine

## 2019-07-14 DIAGNOSIS — I4891 Unspecified atrial fibrillation: Secondary | ICD-10-CM

## 2019-07-14 DIAGNOSIS — K219 Gastro-esophageal reflux disease without esophagitis: Secondary | ICD-10-CM

## 2019-07-14 LAB — BLOOD GAS, ARTERIAL
Acid-Base Excess: 11.9 mmol/L — ABNORMAL HIGH (ref 0.0–2.0)
Bicarbonate: 34 mmol/L — ABNORMAL HIGH (ref 20.0–28.0)
FIO2: 28
O2 Saturation: 95.4 %
Patient temperature: 36.7
pCO2 arterial: 72.4 mmHg (ref 32.0–48.0)
pH, Arterial: 7.339 — ABNORMAL LOW (ref 7.350–7.450)
pO2, Arterial: 82.4 mmHg — ABNORMAL LOW (ref 83.0–108.0)

## 2019-07-14 LAB — CBC
HCT: 39.4 % (ref 36.0–46.0)
Hemoglobin: 11.2 g/dL — ABNORMAL LOW (ref 12.0–15.0)
MCH: 27.5 pg (ref 26.0–34.0)
MCHC: 28.4 g/dL — ABNORMAL LOW (ref 30.0–36.0)
MCV: 96.8 fL (ref 80.0–100.0)
Platelets: 204 10*3/uL (ref 150–400)
RBC: 4.07 MIL/uL (ref 3.87–5.11)
RDW: 14 % (ref 11.5–15.5)
WBC: 9.2 10*3/uL (ref 4.0–10.5)
nRBC: 0 % (ref 0.0–0.2)

## 2019-07-14 LAB — BASIC METABOLIC PANEL
Anion gap: 8 (ref 5–15)
BUN: 14 mg/dL (ref 8–23)
CO2: 36 mmol/L — ABNORMAL HIGH (ref 22–32)
Calcium: 8.3 mg/dL — ABNORMAL LOW (ref 8.9–10.3)
Chloride: 98 mmol/L (ref 98–111)
Creatinine, Ser: 0.68 mg/dL (ref 0.44–1.00)
GFR calc Af Amer: >60 mL/min (ref >60)
GFR calc non Af Amer: >60 mL/min (ref >60)
Glucose, Bld: 117 mg/dL — ABNORMAL HIGH (ref 70–99)
Potassium: 4.4 mmol/L (ref 3.5–5.1)
Sodium: 142 mmol/L (ref 135–145)

## 2019-07-14 MED ORDER — PANTOPRAZOLE SODIUM 40 MG PO TBEC
40.0000 mg | DELAYED_RELEASE_TABLET | Freq: Every day | ORAL | Status: DC
  Administered 2019-07-15 – 2019-07-18 (×4): 40 mg via ORAL
  Filled 2019-07-14 (×4): qty 1

## 2019-07-14 NOTE — Progress Notes (Signed)
PROGRESS NOTE    Carolyn Silva  [id number] DOB: Feb 18, 1950 DOA: 07/12/2019 PCP: Celene Squibb, MD   Chief Complaint  Patient presents with  . Tachycardia    Brief Narrative: As per H&P written by Dr. Darrick Meigs on 07/13/19 69 y.o. female, with history of asthma, hypertension, newly diagnosed atrial fibrillation on anticoagulation with Eliquis was brought to the ED with complaints of tachycardia and respiratory difficulty.  Patient was discharged from hospital on May 27 after she was diagnosed with new onset A. fib and underwent successful cardioversion.  Patient was discharged on apixaban.  Patient's daughter is a respiratory therapist and went on to check her mother today.  Patient seem to have respiratory difficulty.  She placed her on oxygen saturation monitor and noted the patient's was tachycardic with O2 sats decreased. In the ED patient was found to be in A. fib with RVR with heart rate 140s.  Patient underwent electrical cardioversion in the ED and tolerated procedure well.  However she was noted to have end-tidal CO2 in the 70s.  Patient underwent propofol sedation.  ABG showed PCO2 of 97.  Patient was alert and oriented x3.  She was placed on BiPAP for acute respiratory acidosis. At this time patient is on BiPAP, denies any chest pain.  Denies nausea or vomiting.  No history of abdominal pain or dysuria.  Assessment & Plan: 1-Acute respiratory failure with hypercapnia (HCC) -In the setting of obesity hypoventilation syndrome and obstructive sleep apnea -Patient will spend in today for sleep study as an outpatient -Excellent response to the use of BiPAP having no perceived symptom into the 70s, awake, oriented x3 and very interactive. -will repeat ABG in am -continue QHS BIPAP  2-a. Fib with RVR -Post cardioversion in the ED -Heart rate stable currently. -Continue Eliquis for secondary prevention -Has outpatient follow-up as scheduled with cardiology service. -will make sure at  discharge she follow up with monitoring event as previously prescribed.  3-morbid obesity -Body mass index is 48.14 kg/m. -Low calorie diet, portion control increase physical activity discussed with patient.  4-history of asthma -Stable medical condition without any wheezing or shortness of breath. -Continue monitoring -Continue home bronchodilators regimen.  5-leukocytosis -Received steroids -Within normal limits currently -Follow trend. -No need for further antibiotics.  6-GERD -continue PPI   DVT prophylaxis: eliquis. Code Status: (Full/Partial - specify details) Family Communication: no family at bedside today; patient expressed that she will update her family members. Disposition:   Status is: Inpatient  Dispo: The patient is from: home               Anticipated d/c is to: home               Anticipated d/c date is: to be determine               Patient currently not medically for discharge; still requiring BIPAP QHS and with CO2 in the 70's range. Will closely monitor further improvement and repeat ABG in am. HR has remained stable after cardioversion in the ED; will observe HR situation with activities.       Consultants:   None   Procedures:  See below for x-ray reports.   Antimicrobials:  None  Subjective: Afebrile, no chest pain, no nausea, no vomiting.  Reports breathing continues stable.  No palpitations.  Objective: Vitals:   07/14/19 1500 07/14/19 1600 07/14/19 1625 07/14/19 1651  BP: (!) 161/80 140/77    Pulse: 83  85   Resp:  (!)  21    Temp:    98.1 F (36.7 C)  TempSrc:    Axillary  SpO2: 97%  97%   Weight:      Height:        Intake/Output Summary (Last 24 hours) at 07/14/2019 1809 Last data filed at 07/14/2019 1500 Gross per 24 hour  Intake 4091.8 ml  Output --  Net 4091.8 ml   Filed Weights   07/12/19 2130 07/13/19 0539 07/14/19 0500  Weight: 130.6 kg 132.2 kg 135.3 kg    Examination:  General exam: Appears calm and  comfortable; afebrile, no chest pain, no nausea, no vomiting.  Reports breathing to be stable.  Oriented x3. Respiratory system: Respiratory effort normal.  Decreased breath sounds at the bases.  No crackles, no wheezing.  No use of accessory muscle. Cardiovascular system: S1 & S2 heard, no rubs, no gallops, no murmurs.  Trace edema bilaterally appreciated on her legs. Gastrointestinal system: Abdomen is obese, nondistended, soft and nontender. No organomegaly or masses felt. Normal bowel sounds heard. Central nervous system: Alert and oriented. No focal neurological deficits. Extremities: Symmetric 54/5 due to poor effort and deconditioning.   Skin: No rashes, no petechiae. Psychiatry: Judgement and insight appear normal. Mood & affect appropriate.     Data Reviewed: I have personally reviewed following labs and imaging studies  CBC: Recent Labs  Lab 07/09/19 1253 07/11/19 0610 07/12/19 2128 07/13/19 0546 07/14/19 0902  WBC 11.3* 18.4* 16.5* 15.4* 9.2  NEUTROABS 9.3* 16.7* 14.0*  --   --   HGB 12.0 12.3 11.8* 11.6* 11.2*  HCT 40.2 44.1 41.4 42.4 39.4  MCV 91.4 96.3 96.7 98.6 96.8  PLT 253 291 262 260 0000000    Basic Metabolic Panel: Recent Labs  Lab 07/09/19 1253 07/11/19 0610 07/12/19 2128 07/13/19 0546 07/14/19 0902  NA 142 143 140 141 142  K 4.5 4.9 4.2 4.7 4.4  CL 100 99 97* 97* 98  CO2 32 33* 34* 35* 36*  GLUCOSE 112* 144* 116* 120* 117*  BUN 8 17 29* 28* 14  CREATININE 0.73 0.87 0.96 0.86 0.68  CALCIUM 9.6 8.8* 8.4* 8.3* 8.3*  MG 2.1 2.3  --   --   --     GFR: Estimated Creatinine Clearance: 96.6 mL/min (by C-G formula based on SCr of 0.68 mg/dL).  Liver Function Tests: Recent Labs  Lab 07/13/19 0546  AST 29  ALT 40  ALKPHOS 77  BILITOT 0.5  PROT 6.7  ALBUMIN 3.4*    CBG: No results for input(s): GLUCAP in the last 168 hours.   Recent Results (from the past 240 hour(s))  SARS Coronavirus 2 by RT PCR (hospital order, performed in [facility]  hospital lab) Nasopharyngeal Nasopharyngeal Swab     Status: None   Collection Time: 07/09/19  4:00 PM   Specimen: Nasopharyngeal Swab  Result Value Ref Range Status   SARS Coronavirus 2 NEGATIVE NEGATIVE Final    Comment: (NOTE) SARS-CoV-2 target nucleic acids are NOT DETECTED. The SARS-CoV-2 RNA is generally detectable in upper and lower respiratory specimens during the acute phase of infection. The lowest concentration of SARS-CoV-2 viral copies this assay can detect is 250 copies / mL. A negative result does not preclude SARS-CoV-2 infection and should not be used as the sole basis for treatment or other patient management decisions.  A negative result may occur with improper specimen collection / handling, submission of specimen other than nasopharyngeal swab, presence of viral mutation(s) within the areas targeted by this  assay, and inadequate number of viral copies (<250 copies / mL). A negative result must be combined with clinical observations, patient history, and epidemiological information. Fact Sheet for Patients:   StrictlyIdeas.no Fact Sheet for Healthcare Providers: BankingDealers.co.za This test is not yet approved or cleared  by the [place] FDA and has been authorized for detection and/or diagnosis of SARS-CoV-2 by FDA under an Emergency Use Authorization (EUA).  This EUA will remain in effect (meaning this test can be used) for the duration of the COVID-19 declaration under Section 564(b)(1) of the Act, 21 U.S.C. section 360bbb-3(b)(1), unless the authorization is terminated or revoked sooner. Performed at [facility], [address], [place], [place] [address]   SARS Coronavirus 2 by RT PCR (hospital order, performed in [facility] hospital lab) Nasopharyngeal Nasopharyngeal Swab     Status: None   Collection Time: 07/12/19 11:45 PM   Specimen: Nasopharyngeal Swab  Result Value Ref Range Status   SARS  Coronavirus 2 NEGATIVE NEGATIVE Final    Comment: (NOTE) SARS-CoV-2 target nucleic acids are NOT DETECTED. The SARS-CoV-2 RNA is generally detectable in upper and lower respiratory specimens during the acute phase of infection. The lowest concentration of SARS-CoV-2 viral copies this assay can detect is 250 copies / mL. A negative result does not preclude SARS-CoV-2 infection and should not be used as the sole basis for treatment or other patient management decisions.  A negative result may occur with improper specimen collection / handling, submission of specimen other than nasopharyngeal swab, presence of viral mutation(s) within the areas targeted by this assay, and inadequate number of viral copies (<250 copies / mL). A negative result must be combined with clinical observations, patient history, and epidemiological information. Fact Sheet for Patients:   StrictlyIdeas.no Fact Sheet for Healthcare Providers: BankingDealers.co.za This test is not yet approved or cleared  by the [place] FDA and has been authorized for detection and/or diagnosis of SARS-CoV-2 by FDA under an Emergency Use Authorization (EUA).  This EUA will remain in effect (meaning this test can be used) for the duration of the COVID-19 declaration under Section 564(b)(1) of the Act, 21 U.S.C. section 360bbb-3(b)(1), unless the authorization is terminated or revoked sooner. Performed at [facility], [address], [place], [place] [address]   MRSA PCR Screening     Status: None   Collection Time: 07/13/19  4:55 AM   Specimen: Nasal Mucosa; Nasopharyngeal  Result Value Ref Range Status   MRSA by PCR NEGATIVE NEGATIVE Final    Comment:        The GeneXpert MRSA Assay (FDA approved for NASAL specimens only), is one component of a comprehensive MRSA colonization surveillance program. It is not intended to diagnose MRSA infection nor to guide or monitor  treatment for MRSA infections. Performed at [facility], [address] [place], [place] [address]      Radiology Studies: DG Chest Portable 1 View  Result Date: 07/12/2019 CLINICAL DATA:  69 year old female with heart flutter. EXAM: PORTABLE CHEST 1 VIEW COMPARISON:  Chest CT dated 07/09/2019. FINDINGS: Left lung base density may represent atelectasis. No consolidative changes. There is no large pleural effusion or pneumothorax. There is mild cardiomegaly. There is prominence of the central pulmonary vasculature which may represent a degree of pulmonary hypertension. No acute osseous pathology. IMPRESSION: Mild cardiomegaly with findings suggestive of pulmonary hypertension. No focal consolidation. Electronically Signed   By: Anner Crete M.D.   On: 07/12/2019 21:55    Scheduled Meds: . apixaban  5 mg Oral BID  . chlorhexidine  15 mL Mouth Rinse BID  . Chlorhexidine Gluconate Cloth  6 each Topical Daily  . ipratropium  0.5 mg Nebulization Q6H  . levalbuterol  0.63 mg Nebulization Q6H  . mouth rinse  15 mL Mouth Rinse q12n4p  . sodium chloride flush  3 mL Intravenous Q12H   Continuous Infusions: . sodium chloride 1,000 mL (07/14/19 1359)  . sodium chloride       LOS: 1 day    Time spent: 35 minutes.    Barton Dubois, MD [facility]   To contact the attending provider between 7A-7P or the covering provider during after hours 7P-7A, please log into the web site [email or web address] and access using universal [place] password for that web site. If you do not have the password, please call the hospital operator.  07/14/2019, 6:09 PM

## 2019-07-15 LAB — BLOOD GAS, ARTERIAL
Acid-Base Excess: 12.8 mmol/L — ABNORMAL HIGH (ref 0.0–2.0)
Bicarbonate: 35.3 mmol/L — ABNORMAL HIGH (ref 20.0–28.0)
FIO2: 26
O2 Saturation: 96.8 %
Patient temperature: 37
pCO2 arterial: 66.7 mmHg (ref 32.0–48.0)
pH, Arterial: 7.379 (ref 7.350–7.450)
pO2, Arterial: 92.2 mmHg (ref 83.0–108.0)

## 2019-07-15 MED ORDER — BUTALBITAL-APAP-CAFFEINE 50-325-40 MG PO TABS
1.0000 | ORAL_TABLET | Freq: Four times a day (QID) | ORAL | Status: AC | PRN
  Administered 2019-07-16 – 2019-07-19 (×2): 1 via ORAL
  Filled 2019-07-15 (×2): qty 1

## 2019-07-15 NOTE — Progress Notes (Signed)
Patient most likely will benefit with a home BiPAP unit. It doesn't appear she has sleep apnea but more of a sleep hypoventilation syndrome. Sleep study is recommended though. PCO2 is chronic elevated. Also she does very well with BiPAP and her sleep.

## 2019-07-15 NOTE — Progress Notes (Signed)
PROGRESS NOTE    Carolyn Silva  [id number] DOB: 03/13/1950 DOA: 07/12/2019 PCP: Celene Squibb, MD   Chief Complaint  Patient presents with  . Tachycardia    Brief Narrative: As per H&P written by Dr. Darrick Meigs on 07/13/19 69 y.o. female, with history of asthma, hypertension, newly diagnosed atrial fibrillation on anticoagulation with Eliquis was brought to the ED with complaints of tachycardia and respiratory difficulty.  Patient was discharged from hospital on May 27 after she was diagnosed with new onset A. fib and underwent successful cardioversion.  Patient was discharged on apixaban.  Patient's daughter is a respiratory therapist and went on to check her mother today.  Patient seem to have respiratory difficulty.  She placed her on oxygen saturation monitor and noted the patient's was tachycardic with O2 sats decreased. In the ED patient was found to be in A. fib with RVR with heart rate 140s.  Patient underwent electrical cardioversion in the ED and tolerated procedure well.  However she was noted to have end-tidal CO2 in the 70s.  Patient underwent propofol sedation.  ABG showed PCO2 of 97.  Patient was alert and oriented x3.  She was placed on BiPAP for acute respiratory acidosis. At this time patient is on BiPAP, denies any chest pain.  Denies nausea or vomiting.  No history of abdominal pain or dysuria.  Assessment & Plan: 1-Acute respiratory failure with hypercapnia (HCC) -In the setting of obesity hypoventilation syndrome and obstructive sleep apnea -Patient will spend in today for sleep study as an outpatient -Excellent response to the use of BiPAP having  CO2 down into 66's range. -Patient is alert, awake, oriented x3 and very interactive; 2L [place] in place. Patient's niece expressed desaturation episodes while ambulating as an outpatient.  -will repeat ABG in am -continue QHS BIPAP  2-a. Fib with RVR -Post cardioversion in the ED -Heart rate stable currently. -Continue  Eliquis for secondary prevention -Has outpatient follow-up as scheduled with cardiology service. -will make sure at discharge she follow up with monitoring event as previously prescribed.  3-morbid obesity: With undiagnosed obstructive sleep apnea and obesity hypoventilation syndrome. -Body mass index is 47.75 kg/m. -Low calorie diet, portion control increase physical activity discussed with patient. -Will need sleep study as an outpatient -Assess oxygen supplementation needs at rest and on exertion.  4-history of asthma -Stable medical condition without any wheezing or shortness of breath. -Continue monitoring -Continue home bronchodilators regimen.  5-leukocytosis -Received steroids -Within normal limits currently -Follow trend. -No need for further antibiotics at this time. -patient remains afebrile.   6-GERD -continue PPI   DVT prophylaxis: eliquis. Code Status: (Full/Partial - specify details) Family Communication: Niece over the phone at bedside, 06/17/19 Disposition:   Status is: Inpatient  Dispo: The patient is from: home               Anticipated d/c is to: home               Anticipated d/c date is: to be determine               Patient currently not medically for discharge; still requiring BIPAP QHS and with CO2 elevated CO2; Will transfer to telemetry bed; increase physical activity with PT evaluation.  Assess oxygen supplementation needs and try to arrange for a BiPAP/CPAP unit at time of discharge.       Consultants:   None   Procedures:  See below for x-ray reports.   Antimicrobials:  None  Subjective:  No fever, no chest pain, no nausea, no vomiting.  Reports her breathing is a stable and at baseline currently.  Feeling weak and deconditioned.  Objective: Vitals:   07/15/19 1300 07/15/19 1400 07/15/19 1458 07/15/19 1500  BP: (!) 147/73     Pulse: 85 88  82  Resp: (!) 22 (!) 24  (!) 8  Temp:      TempSrc:      SpO2: 99% 96% 99% 98%  Weight:       Height:        Intake/Output Summary (Last 24 hours) at 07/15/2019 1541 Last data filed at 07/15/2019 0900 Gross per 24 hour  Intake 2227.34 ml  Output --  Net 2227.34 ml   Filed Weights   07/13/19 0539 07/14/19 0500 07/15/19 0600  Weight: 132.2 kg 135.3 kg 134.2 kg    Examination: General exam: Alert, awake, oriented x 3, no chest pain, no palpitations, oriented x3 very cooperative with examination and in no acute distress.  Patient is afebrile. Respiratory system: No wheezing, no crackles, good air movement bilaterally.   Cardiovascular system: Rate controlled in sinus rhythm currently; no rubs or gallops appreciated. Gastrointestinal system: Abdomen is obese, nondistended, soft and nontender. No organomegaly or masses felt. Normal bowel sounds heard. Central nervous system: Alert and oriented. No focal neurological deficits. Extremities: No cyanosis or clubbing.  Patient with a slight physical deconditioning demonstrated muscle strength of 4 out of 5 bilaterally and symmetrically in the setting of poor effort. Skin: No rashes, no petechiae. Psychiatry: Judgement and insight appear normal. Mood & affect appropriate.    Data Reviewed: I have personally reviewed following labs and imaging studies  CBC: Recent Labs  Lab 07/09/19 1253 07/11/19 0610 07/12/19 2128 07/13/19 0546 07/14/19 0902  WBC 11.3* 18.4* 16.5* 15.4* 9.2  NEUTROABS 9.3* 16.7* 14.0*  --   --   HGB 12.0 12.3 11.8* 11.6* 11.2*  HCT 40.2 44.1 41.4 42.4 39.4  MCV 91.4 96.3 96.7 98.6 96.8  PLT 253 291 262 260 0000000    Basic Metabolic Panel: Recent Labs  Lab 07/09/19 1253 07/11/19 0610 07/12/19 2128 07/13/19 0546 07/14/19 0902  NA 142 143 140 141 142  K 4.5 4.9 4.2 4.7 4.4  CL 100 99 97* 97* 98  CO2 32 33* 34* 35* 36*  GLUCOSE 112* 144* 116* 120* 117*  BUN 8 17 29* 28* 14  CREATININE 0.73 0.87 0.96 0.86 0.68  CALCIUM 9.6 8.8* 8.4* 8.3* 8.3*  MG 2.1 2.3  --   --   --     GFR: Estimated  Creatinine Clearance: 96.2 mL/min (by C-G formula based on SCr of 0.68 mg/dL).  Liver Function Tests: Recent Labs  Lab 07/13/19 0546  AST 29  ALT 40  ALKPHOS 77  BILITOT 0.5  PROT 6.7  ALBUMIN 3.4*    CBG: No results for input(s): GLUCAP in the last 168 hours.   Recent Results (from the past 240 hour(s))  SARS Coronavirus 2 by RT PCR (hospital order, performed in [facility] hospital lab) Nasopharyngeal Nasopharyngeal Swab     Status: None   Collection Time: 07/09/19  4:00 PM   Specimen: Nasopharyngeal Swab  Result Value Ref Range Status   SARS Coronavirus 2 NEGATIVE NEGATIVE Final    Comment: (NOTE) SARS-CoV-2 target nucleic acids are NOT DETECTED. The SARS-CoV-2 RNA is generally detectable in upper and lower respiratory specimens during the acute phase of infection. The lowest concentration of SARS-CoV-2 viral copies this assay can detect is 250  copies / mL. A negative result does not preclude SARS-CoV-2 infection and should not be used as the sole basis for treatment or other patient management decisions.  A negative result may occur with improper specimen collection / handling, submission of specimen other than nasopharyngeal swab, presence of viral mutation(s) within the areas targeted by this assay, and inadequate number of viral copies (<250 copies / mL). A negative result must be combined with clinical observations, patient history, and epidemiological information. Fact Sheet for Patients:   StrictlyIdeas.no Fact Sheet for Healthcare Providers: BankingDealers.co.za This test is not yet approved or cleared  by the [place] FDA and has been authorized for detection and/or diagnosis of SARS-CoV-2 by FDA under an Emergency Use Authorization (EUA).  This EUA will remain in effect (meaning this test can be used) for the duration of the COVID-19 declaration under Section 564(b)(1) of the Act, 21 U.S.C. section  360bbb-3(b)(1), unless the authorization is terminated or revoked sooner. Performed at [facility], [address], [place], [place] [address]   SARS Coronavirus 2 by RT PCR (hospital order, performed in [facility] hospital lab) Nasopharyngeal Nasopharyngeal Swab     Status: None   Collection Time: 07/12/19 11:45 PM   Specimen: Nasopharyngeal Swab  Result Value Ref Range Status   SARS Coronavirus 2 NEGATIVE NEGATIVE Final    Comment: (NOTE) SARS-CoV-2 target nucleic acids are NOT DETECTED. The SARS-CoV-2 RNA is generally detectable in upper and lower respiratory specimens during the acute phase of infection. The lowest concentration of SARS-CoV-2 viral copies this assay can detect is 250 copies / mL. A negative result does not preclude SARS-CoV-2 infection and should not be used as the sole basis for treatment or other patient management decisions.  A negative result may occur with improper specimen collection / handling, submission of specimen other than nasopharyngeal swab, presence of viral mutation(s) within the areas targeted by this assay, and inadequate number of viral copies (<250 copies / mL). A negative result must be combined with clinical observations, patient history, and epidemiological information. Fact Sheet for Patients:   StrictlyIdeas.no Fact Sheet for Healthcare Providers: BankingDealers.co.za This test is not yet approved or cleared  by the [place] FDA and has been authorized for detection and/or diagnosis of SARS-CoV-2 by FDA under an Emergency Use Authorization (EUA).  This EUA will remain in effect (meaning this test can be used) for the duration of the COVID-19 declaration under Section 564(b)(1) of the Act, 21 U.S.C. section 360bbb-3(b)(1), unless the authorization is terminated or revoked sooner. Performed at [facility], [address] [place], [place] [address]   MRSA PCR Screening     Status: None    Collection Time: 07/13/19  4:55 AM   Specimen: Nasal Mucosa; Nasopharyngeal  Result Value Ref Range Status   MRSA by PCR NEGATIVE NEGATIVE Final    Comment:        The GeneXpert MRSA Assay (FDA approved for NASAL specimens only), is one component of a comprehensive MRSA colonization surveillance program. It is not intended to diagnose MRSA infection nor to guide or monitor treatment for MRSA infections. Performed at [facility], [address] [place], [place] [address]      Radiology Studies: No results found.  Scheduled Meds: . apixaban  5 mg Oral BID  . chlorhexidine  15 mL Mouth Rinse BID  . Chlorhexidine Gluconate Cloth  6 each Topical Daily  . ipratropium  0.5 mg Nebulization Q6H  . levalbuterol  0.63 mg Nebulization Q6H  .  mouth rinse  15 mL Mouth Rinse q12n4p  . pantoprazole  40 mg Oral Daily  . sodium chloride flush  3 mL Intravenous Q12H   Continuous Infusions: . sodium chloride 125 mL/hr at 07/15/19 0900  . sodium chloride       LOS: 2 days    Time spent: 35 minutes.    Barton Dubois, MD [facility]   To contact the attending provider between 7A-7P or the covering provider during after hours 7P-7A, please log into the web site [email or web address] and access using universal [place] password for that web site. If you do not have the password, please call the hospital operator.  07/15/2019, 3:41 PM

## 2019-07-15 NOTE — Progress Notes (Signed)
Critical pCO2 66.7. MD notified. PCO2 is improving. Continue to monitor.

## 2019-07-16 DIAGNOSIS — G4733 Obstructive sleep apnea (adult) (pediatric): Secondary | ICD-10-CM

## 2019-07-16 LAB — BLOOD GAS, ARTERIAL
Acid-Base Excess: 12.5 mmol/L — ABNORMAL HIGH (ref 0.0–2.0)
Bicarbonate: 35.1 mmol/L — ABNORMAL HIGH (ref 20.0–28.0)
FIO2: 21
O2 Saturation: 89.2 %
Patient temperature: 37
pCO2 arterial: 58.8 mmHg — ABNORMAL HIGH (ref 32.0–48.0)
pH, Arterial: 7.421 (ref 7.350–7.450)
pO2, Arterial: 57.7 mmHg — ABNORMAL LOW (ref 83.0–108.0)

## 2019-07-16 MED ORDER — AMLODIPINE BESYLATE 5 MG PO TABS
5.0000 mg | ORAL_TABLET | Freq: Every day | ORAL | Status: DC
  Administered 2019-07-16 – 2019-07-19 (×4): 5 mg via ORAL
  Filled 2019-07-16 (×3): qty 1

## 2019-07-16 MED ORDER — IPRATROPIUM BROMIDE 0.02 % IN SOLN
0.5000 mg | Freq: Three times a day (TID) | RESPIRATORY_TRACT | Status: DC
  Administered 2019-07-17 – 2019-07-23 (×19): 0.5 mg via RESPIRATORY_TRACT
  Filled 2019-07-16 (×19): qty 2.5

## 2019-07-16 MED ORDER — LEVALBUTEROL HCL 0.63 MG/3ML IN NEBU
0.6300 mg | INHALATION_SOLUTION | Freq: Three times a day (TID) | RESPIRATORY_TRACT | Status: DC
  Administered 2019-07-17 – 2019-07-23 (×19): 0.63 mg via RESPIRATORY_TRACT
  Filled 2019-07-16 (×19): qty 3

## 2019-07-16 NOTE — Progress Notes (Signed)
PROGRESS NOTE    Carolyn Silva  [id number] DOB: 01/07/1951 DOA: 07/12/2019 PCP: Celene Squibb, MD   Chief Complaint  Patient presents with  . Tachycardia    Brief Narrative: As per H&P written by Dr. Darrick Meigs on 07/13/19 69 y.o. female, with history of asthma, hypertension, newly diagnosed atrial fibrillation on anticoagulation with Eliquis was brought to the ED with complaints of tachycardia and respiratory difficulty.  Patient was discharged from hospital on May 27 after she was diagnosed with new onset A. fib and underwent successful cardioversion.  Patient was discharged on apixaban.  Patient's daughter is a respiratory therapist and went on to check her mother today.  Patient seem to have respiratory difficulty.  She placed her on oxygen saturation monitor and noted the patient's was tachycardic with O2 sats decreased. In the ED patient was found to be in A. fib with RVR with heart rate 140s.  Patient underwent electrical cardioversion in the ED and tolerated procedure well.  However she was noted to have end-tidal CO2 in the 70s.  Patient underwent propofol sedation.  ABG showed PCO2 of 97.  Patient was alert and oriented x3.  She was placed on BiPAP for acute respiratory acidosis. At this time patient is on BiPAP, denies any chest pain.  Denies nausea or vomiting.  No history of abdominal pain or dysuria.  Assessment & Plan: 1-Acute respiratory failure with hypercapnia (HCC) -In the setting of obesity hypoventilation syndrome and obstructive sleep apnea -Patient will need sleep study as an outpatient, and prior to that continue use of BIPAP machine on automode. -Excellent response to the use of BiPAP having  CO2 down into 58 range. -Patient is alert, awake, oriented x3 and very interactive;. -Patient's niece expressed desaturation episodes while ambulating as an outpatient. Home O2 screening demonstrated the need for 2L [place] with exertion. -continue QHS BIPAP -TOC to assist securing  equipment for safe discharge.  2-a. Fib with RVR -Post cardioversion in the ED on 07/13/19 -Heart rate stable and sinus rhythm currently. -Continue Eliquis for secondary prevention -Has outpatient follow-up scheduled with cardiology service. -will make sure at discharge she follow up with monitoring event as previously prescribed.  3-morbid obesity: With undiagnosed obstructive sleep apnea and obesity hypoventilation syndrome. -Body mass index is 47.75 kg/m. -Low calorie diet, portion control increase physical activity discussed with patient. -Will need sleep study as an outpatient -Assess oxygen supplementation needs at rest and on exertion.  4-history of asthma -Stable medical condition without any wheezing or shortness of breath. -Continue monitoring -Continue home bronchodilators regimen. -not need for steroids therapy currently.  5-leukocytosis -Received steroids -Within normal limits currently -no signs of acute infections -No need for further antibiotics at this time.  6-GERD -continue PPI   DVT prophylaxis: eliquis. Code Status: (Full/Partial - specify details) Family Communication: Niece at bedside Disposition:   Status is: Inpatient  Dispo: The patient is from: home               Anticipated d/c is to: home               Anticipated d/c date is: to be determine               Patient currently not medically for discharge; still requiring secure BIPAP machine to use QHS after discharge. Otherwise patient will experience increase in her Co2 and be readmitted with hypercapnia. Continue telemetry monitoring. Due to elevated BP will start low dose amlodipine daily. Home O2 screening demonstrated the  need of @L  [place] supplementation with exertion.      Consultants:   None   Procedures:  See below for x-ray reports.   Antimicrobials:  None  Subjective: No fever, no chest pain, no nausea, no vomiting.  Reports breathing is stable.  Alert and oriented x3.   Restful/refreshing night after using BiPAP.  Objective: Vitals:   07/16/19 0804 07/16/19 0900 07/16/19 1000 07/16/19 1134  BP:   106/85   Pulse:  73 73 70  Resp:  18 18 19   Temp:    98.3 F (36.8 C)  TempSrc:    Oral  SpO2: 97% 96% 100% 100%  Weight:      Height:        Intake/Output Summary (Last 24 hours) at 07/16/2019 1216 Last data filed at 07/15/2019 1604 Gross per 24 hour  Intake 129.42 ml  Output --  Net 129.42 ml   Filed Weights   07/13/19 0539 07/14/19 0500 07/15/19 0600  Weight: 132.2 kg 135.3 kg 134.2 kg    Examination: General exam: Alert, awake, oriented x 3; no chest pain, no nausea, no vomiting.  Patient reports no palpitations or acute distress currently.  Reports breathing is  stable Respiratory system: Clear to auscultation. Respiratory effort normal.  No using accessory muscles. Cardiovascular system:Rate controlled and sinus rhythm, No murmurs, rubs or gallops. Gastrointestinal system: Abdomen is obese, nondistended, soft and nontender. No organomegaly or masses felt. Normal bowel sounds heard. Central nervous system: Alert and oriented X 3. No focal neurological deficits. Extremities: No cyanosis or clubbing.  Skin: No rashes, no petechiae. Psychiatry: Judgement and insight appear normal. Mood & affect appropriate.    Data Reviewed: I have personally reviewed following labs and imaging studies  CBC: Recent Labs  Lab 07/09/19 1253 07/11/19 0610 07/12/19 2128 07/13/19 0546 07/14/19 0902  WBC 11.3* 18.4* 16.5* 15.4* 9.2  NEUTROABS 9.3* 16.7* 14.0*  --   --   HGB 12.0 12.3 11.8* 11.6* 11.2*  HCT 40.2 44.1 41.4 42.4 39.4  MCV 91.4 96.3 96.7 98.6 96.8  PLT 253 291 262 260 0000000    Basic Metabolic Panel: Recent Labs  Lab 07/09/19 1253 07/11/19 0610 07/12/19 2128 07/13/19 0546 07/14/19 0902  NA 142 143 140 141 142  K 4.5 4.9 4.2 4.7 4.4  CL 100 99 97* 97* 98  CO2 32 33* 34* 35* 36*  GLUCOSE 112* 144* 116* 120* 117*  BUN 8 17 29* 28* 14   CREATININE 0.73 0.87 0.96 0.86 0.68  CALCIUM 9.6 8.8* 8.4* 8.3* 8.3*  MG 2.1 2.3  --   --   --     GFR: Estimated Creatinine Clearance: 96.2 mL/min (by C-G formula based on SCr of 0.68 mg/dL).  Liver Function Tests: Recent Labs  Lab 07/13/19 0546  AST 29  ALT 40  ALKPHOS 77  BILITOT 0.5  PROT 6.7  ALBUMIN 3.4*    CBG: No results for input(s): GLUCAP in the last 168 hours.   Recent Results (from the past 240 hour(s))  SARS Coronavirus 2 by RT PCR (hospital order, performed in [facility] hospital lab) Nasopharyngeal Nasopharyngeal Swab     Status: None   Collection Time: 07/09/19  4:00 PM   Specimen: Nasopharyngeal Swab  Result Value Ref Range Status   SARS Coronavirus 2 NEGATIVE NEGATIVE Final    Comment: (NOTE) SARS-CoV-2 target nucleic acids are NOT DETECTED. The SARS-CoV-2 RNA is generally detectable in upper and lower respiratory specimens during the acute phase of infection. The lowest  concentration of SARS-CoV-2 viral copies this assay can detect is 250 copies / mL. A negative result does not preclude SARS-CoV-2 infection and should not be used as the sole basis for treatment or other patient management decisions.  A negative result may occur with improper specimen collection / handling, submission of specimen other than nasopharyngeal swab, presence of viral mutation(s) within the areas targeted by this assay, and inadequate number of viral copies (<250 copies / mL). A negative result must be combined with clinical observations, patient history, and epidemiological information. Fact Sheet for Patients:   StrictlyIdeas.no Fact Sheet for Healthcare Providers: BankingDealers.co.za This test is not yet approved or cleared  by the [place] FDA and has been authorized for detection and/or diagnosis of SARS-CoV-2 by FDA under an Emergency Use Authorization (EUA).  This EUA will remain in effect (meaning this test  can be used) for the duration of the COVID-19 declaration under Section 564(b)(1) of the Act, 21 U.S.C. section 360bbb-3(b)(1), unless the authorization is terminated or revoked sooner. Performed at [facility], [address], [place], [place] [address]   SARS Coronavirus 2 by RT PCR (hospital order, performed in [facility] hospital lab) Nasopharyngeal Nasopharyngeal Swab     Status: None   Collection Time: 07/12/19 11:45 PM   Specimen: Nasopharyngeal Swab  Result Value Ref Range Status   SARS Coronavirus 2 NEGATIVE NEGATIVE Final    Comment: (NOTE) SARS-CoV-2 target nucleic acids are NOT DETECTED. The SARS-CoV-2 RNA is generally detectable in upper and lower respiratory specimens during the acute phase of infection. The lowest concentration of SARS-CoV-2 viral copies this assay can detect is 250 copies / mL. A negative result does not preclude SARS-CoV-2 infection and should not be used as the sole basis for treatment or other patient management decisions.  A negative result may occur with improper specimen collection / handling, submission of specimen other than nasopharyngeal swab, presence of viral mutation(s) within the areas targeted by this assay, and inadequate number of viral copies (<250 copies / mL). A negative result must be combined with clinical observations, patient history, and epidemiological information. Fact Sheet for Patients:   StrictlyIdeas.no Fact Sheet for Healthcare Providers: BankingDealers.co.za This test is not yet approved or cleared  by the [place] FDA and has been authorized for detection and/or diagnosis of SARS-CoV-2 by FDA under an Emergency Use Authorization (EUA).  This EUA will remain in effect (meaning this test can be used) for the duration of the COVID-19 declaration under Section 564(b)(1) of the Act, 21 U.S.C. section 360bbb-3(b)(1), unless the authorization is terminated or revoked  sooner. Performed at [facility], [address], [place], [place] [address]   MRSA PCR Screening     Status: None   Collection Time: 07/13/19  4:55 AM   Specimen: Nasal Mucosa; Nasopharyngeal  Result Value Ref Range Status   MRSA by PCR NEGATIVE NEGATIVE Final    Comment:        The GeneXpert MRSA Assay (FDA approved for NASAL specimens only), is one component of a comprehensive MRSA colonization surveillance program. It is not intended to diagnose MRSA infection nor to guide or monitor treatment for MRSA infections. Performed at [facility], [address], [place], [place] [address]      Radiology Studies: No results found.  Scheduled Meds: . apixaban  5 mg Oral BID  . chlorhexidine  15 mL Mouth Rinse BID  . Chlorhexidine Gluconate Cloth  6 each Topical Daily  . ipratropium  0.5 mg  Nebulization Q6H  . levalbuterol  0.63 mg Nebulization Q6H  . mouth rinse  15 mL Mouth Rinse q12n4p  . pantoprazole  40 mg Oral Daily  . sodium chloride flush  3 mL Intravenous Q12H   Continuous Infusions: . sodium chloride       LOS: 3 days    Time spent: 30 minutes.    Barton Dubois, MD [facility]   To contact the attending provider between 7A-7P or the covering provider during after hours 7P-7A, please log into the web site [email or web address] and access using universal [place] password for that web site. If you do not have the password, please call the hospital operator.  07/16/2019, 12:16 PM

## 2019-07-16 NOTE — Care Management Important Message (Signed)
Important Message  Patient Details  Name: Carolyn Silva MRN: [id number] Date of Birth: [birth year]   Medicare Important Message Given:  Yes     Tommy Medal 07/16/2019, 4:14 PM

## 2019-07-16 NOTE — Progress Notes (Signed)
SATURATION QUALIFICATIONS: (This note is used to comply with regulatory documentation for home oxygen)  Patient Saturations on Room Air at Rest = 96%  Patient Saturations on Room Air while Ambulating = 86%  Patient Saturations on 2 Liters of oxygen while Ambulating = 93%  Please briefly explain why patient needs home oxygen: Patient needs home oxygen for exertion. When patient is sitting still on room air, oxygen saturations are within normal limits. When patient is ambulating and exerting herself, she needs oxygen at 2L [place].

## 2019-07-16 NOTE — TOC Initial Note (Signed)
Transition of Care [facility]) - Initial/Assessment Note    Patient Details  Name: Carolyn Silva MRN: [id number] Date of Birth: [birth year]  Transition of Care [facility]) CM/SW Contact:    Ihor Gully, LCSW Phone Number: 07/16/2019, 4:59 PM  Clinical Narrative:                 Patient from home with family. Independent at baseline. No DME in home. Has not had sleep study. Per progression patient will need NIV or CPAP at discharge. Cannot have CPAP without sleep study. TOC to coordinate NIV with [employer].   Expected Discharge Plan: [place] Barriers to Discharge: Continued Medical Work up   Patient Goals and CMS Choice Patient states their goals for this hospitalization and ongoing recovery are:: return home      Expected Discharge Plan and Services Expected Discharge Plan: [place]       Living arrangements for the past 2 months: Single Family Home                 DME Arranged: NIV DME Agency: [employer] Date DME Agency Contacted: 07/16/19 Time DME Agency Contacted: [id number]              Prior Living Arrangements/Services Living arrangements for the past 2 months: Single Family Home Lives with:: Relatives                   Activities of Daily Living Home Assistive Devices/Equipment: None ADL Screening (condition at time of admission) Patient's cognitive ability adequate to safely complete daily activities?: Yes Is the patient deaf or have difficulty hearing?: No Does the patient have difficulty seeing, even when wearing glasses/contacts?: No Does the patient have difficulty concentrating, remembering, or making decisions?: No Patient able to express need for assistance with ADLs?: Yes Does the patient have difficulty dressing or bathing?: No Independently performs ADLs?: Yes (appropriate for developmental age) Does the patient have difficulty walking or climbing stairs?: No Weakness of Legs: None Weakness of Arms/Hands:  None  Permission Sought/Granted                  Emotional Assessment     Affect (typically observed): Accepting, Appropriate Orientation: : Oriented to Self, Oriented to Place, Oriented to  Time, Oriented to Situation Alcohol / Substance Use: Not Applicable Psych Involvement: No (comment)  Admission diagnosis:  Hypercapnic acidosis [E87.2] Atrial flutter with rapid ventricular response (HCC) [I48.92] Acute respiratory failure with hypercapnia ([place]) [J96.02] Patient Active Problem List   Diagnosis Date Noted  . Acute respiratory failure with hypercapnia ([place]) 07/13/2019  . Hypoxia   . Asthma exacerbation 07/09/2019  . Atrial fibrillation ([place]) 07/09/2019  . Essential hypertension 07/09/2019  . Special screening for malignant neoplasms, colon    PCP:  Celene Squibb, MD Pharmacy:   [facility], [place] [address] Phone: [phone] Fax: [phone]     Social Determinants of Health (SDOH) Interventions    Readmission Risk Interventions No flowsheet data found.

## 2019-07-17 NOTE — Progress Notes (Addendum)
Patient requires use of NIV at home due to hypercapnic respiratory failure that develops as a result of obesity which is causing a restrictive thoracic restriction. Based on the most recent ABG on 07/16/19, she still has an elevated PCO2 of 58.8. She also has a need for oxygen throughout the day and would likely benefit from NIV support throughout the day.

## 2019-07-17 NOTE — Progress Notes (Signed)
PROGRESS NOTE    Carolyn Silva  [id number] DOB: [birth year] DOA: 07/12/2019 PCP: Celene Squibb, MD   Brief Narrative:  As per H&P written by Dr. Darrick Meigs on 07/13/19 69 y.o.female,with history of asthma, hypertension, newly diagnosed atrial fibrillation on anticoagulation with Eliquis was brought to the ED with complaints of tachycardia and respiratory difficulty. Patient was discharged from hospital on May 27 after she was diagnosed with new onset A. fib and underwent successful cardioversion. Patient was discharged on apixaban. Patient's daughter is a respiratory therapist and went on to check her mother today. Patient seem to have respiratory difficulty. She placed her on oxygen saturation monitor and noted the patient's was tachycardic with O2 sats decreased. In the ED patient was found to be in A. fib with RVR with heart rate 140s. Patient underwent electrical cardioversion in the ED and tolerated procedure well. However she was noted to have end-tidal CO2 in the 70s. Patient underwent propofol sedation. ABG showed PCO2 of 97. Patient was alert and oriented x3. She was placed on BiPAP for acute respiratory acidosis. At this time patient is on BiPAP, denies any chest pain. Denies nausea or vomiting. No history of abdominal pain or dysuria.  Assessment & Plan:   Active Problems:   Acute respiratory failure with hypercapnia (HCC)   1-Acute respiratory failure with hypercapnia (HCC) -In the setting of obesity related thoracic restriction and obstructive sleep apnea -Patient will need sleep study as an outpatient, and prior to that continue use of NIV  -Excellent response to the use of NIV having  CO2 down into 58 range. -Patient is alert, awake, oriented x3 and very interactive;. -Patient's niece expressed desaturation episodes while ambulating as an outpatient. Home O2 screening demonstrated the need for 2L [place] with exertion. -continue QHS NIV -TOC to assist securing  equipment for safe discharge, hopefully by a.m.  2-a. Fib with RVR -Post cardioversion in the ED on 07/13/19 -Heart rate stable and sinus rhythm currently. -Continue Eliquis for secondary prevention -Has outpatient follow-up scheduled with cardiology service. -will make sure at discharge she follow up with monitoring event as previously prescribed.  3-morbid obesity: With undiagnosed obstructive sleep apnea and obesity hypoventilation syndrome. -Body mass index is 47.75 kg/m. -Low calorie diet, portion control increase physical activity discussed with patient. -Will need sleep study as an outpatient -Assess oxygen supplementation needs at rest and on exertion.  4-history of asthma -Stable medical condition without any wheezing or shortness of breath. -Continue monitoring -Continue home bronchodilators regimen. -not need for steroids therapy currently.  5-leukocytosis -Received steroids -Within normal limits currently -no signs of acute infections -No need for further antibiotics at this time.  6-GERD -continue PPI   DVT prophylaxis: eliquis. Code Status:  Full Family Communication:  None at bedside Disposition:   Status is: Inpatient  Dispo: The patient is from: home   Anticipated d/c is to: home   Anticipated d/c date is: 1 day  Patient currently not medically for discharge; patient will need to have an IV arranged at time of discharge.  Hopefully will be able to arrange for this by tomorrow.     Consultants:   None   Procedures:  See below for x-ray reports.   Antimicrobials:  None  Subjective: Patient seen and evaluated today with no new acute complaints or concerns. No acute concerns or events noted overnight.  Slept well on noninvasive ventilation overnight.  Objective: Vitals:   07/17/19 0744 07/17/19 0800 07/17/19 1136 07/17/19 1346  BP:  Marland Kitchen)  168/80    Pulse:      Resp:  11    Temp:   98.7 F (37.1  C)   TempSrc:   Oral   SpO2: 100%   94%  Weight:      Height:        Intake/Output Summary (Last 24 hours) at 07/17/2019 1458 Last data filed at 07/17/2019 1016 Gross per 24 hour  Intake 3 ml  Output --  Net 3 ml   Filed Weights   07/13/19 0539 07/14/19 0500 07/15/19 0600  Weight: 132.2 kg 135.3 kg 134.2 kg    Examination:  General exam: Appears calm and comfortable, obese Respiratory system: Clear to auscultation. Respiratory effort normal. Cardiovascular system: S1 & S2 heard, RRR. No JVD, murmurs, rubs, gallops or clicks. No pedal edema. Gastrointestinal system: Abdomen is nondistended, soft and nontender. No organomegaly or masses felt. Normal bowel sounds heard. Central nervous system: Alert and oriented. No focal neurological deficits. Extremities: Symmetric 5 x 5 power. Skin: No rashes, lesions or ulcers Psychiatry: Judgement and insight appear normal. Mood & affect appropriate.     Data Reviewed: I have personally reviewed following labs and imaging studies  CBC: Recent Labs  Lab 07/11/19 0610 07/12/19 2128 07/13/19 0546 07/14/19 0902  WBC 18.4* 16.5* 15.4* 9.2  NEUTROABS 16.7* 14.0*  --   --   HGB 12.3 11.8* 11.6* 11.2*  HCT 44.1 41.4 42.4 39.4  MCV 96.3 96.7 98.6 96.8  PLT 291 262 260 0000000   Basic Metabolic Panel: Recent Labs  Lab 07/11/19 0610 07/12/19 2128 07/13/19 0546 07/14/19 0902  NA 143 140 141 142  K 4.9 4.2 4.7 4.4  CL 99 97* 97* 98  CO2 33* 34* 35* 36*  GLUCOSE 144* 116* 120* 117*  BUN 17 29* 28* 14  CREATININE 0.87 0.96 0.86 0.68  CALCIUM 8.8* 8.4* 8.3* 8.3*  MG 2.3  --   --   --    GFR: Estimated Creatinine Clearance: 96.2 mL/min (by C-G formula based on SCr of 0.68 mg/dL). Liver Function Tests: Recent Labs  Lab 07/13/19 0546  AST 29  ALT 40  ALKPHOS 77  BILITOT 0.5  PROT 6.7  ALBUMIN 3.4*   No results for input(s): LIPASE, AMYLASE in the last 168 hours. No results for input(s): AMMONIA in the last 168  hours. Coagulation Profile: No results for input(s): INR, PROTIME in the last 168 hours. Cardiac Enzymes: No results for input(s): CKTOTAL, CKMB, CKMBINDEX, TROPONINI in the last 168 hours. BNP (last 3 results) No results for input(s): PROBNP in the last 8760 hours. HbA1C: No results for input(s): HGBA1C in the last 72 hours. CBG: No results for input(s): GLUCAP in the last 168 hours. Lipid Profile: No results for input(s): CHOL, HDL, LDLCALC, TRIG, CHOLHDL, LDLDIRECT in the last 72 hours. Thyroid Function Tests: No results for input(s): TSH, T4TOTAL, FREET4, T3FREE, THYROIDAB in the last 72 hours. Anemia Panel: No results for input(s): VITAMINB12, FOLATE, FERRITIN, TIBC, IRON, RETICCTPCT in the last 72 hours. Sepsis Labs: No results for input(s): PROCALCITON, LATICACIDVEN in the last 168 hours.  Recent Results (from the past 240 hour(s))  SARS Coronavirus 2 by RT PCR (hospital order, performed in [facility] hospital lab) Nasopharyngeal Nasopharyngeal Swab     Status: None   Collection Time: 07/09/19  4:00 PM   Specimen: Nasopharyngeal Swab  Result Value Ref Range Status   SARS Coronavirus 2 NEGATIVE NEGATIVE Final    Comment: (NOTE) SARS-CoV-2 target nucleic acids are NOT DETECTED.  The SARS-CoV-2 RNA is generally detectable in upper and lower respiratory specimens during the acute phase of infection. The lowest concentration of SARS-CoV-2 viral copies this assay can detect is 250 copies / mL. A negative result does not preclude SARS-CoV-2 infection and should not be used as the sole basis for treatment or other patient management decisions.  A negative result may occur with improper specimen collection / handling, submission of specimen other than nasopharyngeal swab, presence of viral mutation(s) within the areas targeted by this assay, and inadequate number of viral copies (<250 copies / mL). A negative result must be combined with clinical observations, patient history, and  epidemiological information. Fact Sheet for Patients:   StrictlyIdeas.no Fact Sheet for Healthcare Providers: BankingDealers.co.za This test is not yet approved or cleared  by the [place] FDA and has been authorized for detection and/or diagnosis of SARS-CoV-2 by FDA under an Emergency Use Authorization (EUA).  This EUA will remain in effect (meaning this test can be used) for the duration of the COVID-19 declaration under Section 564(b)(1) of the Act, 21 U.S.C. section 360bbb-3(b)(1), unless the authorization is terminated or revoked sooner. Performed at [facility], [address] [place], [place] [address]   SARS Coronavirus 2 by RT PCR (hospital order, performed in [facility] hospital lab) Nasopharyngeal Nasopharyngeal Swab     Status: None   Collection Time: 07/12/19 11:45 PM   Specimen: Nasopharyngeal Swab  Result Value Ref Range Status   SARS Coronavirus 2 NEGATIVE NEGATIVE Final    Comment: (NOTE) SARS-CoV-2 target nucleic acids are NOT DETECTED. The SARS-CoV-2 RNA is generally detectable in upper and lower respiratory specimens during the acute phase of infection. The lowest concentration of SARS-CoV-2 viral copies this assay can detect is 250 copies / mL. A negative result does not preclude SARS-CoV-2 infection and should not be used as the sole basis for treatment or other patient management decisions.  A negative result may occur with improper specimen collection / handling, submission of specimen other than nasopharyngeal swab, presence of viral mutation(s) within the areas targeted by this assay, and inadequate number of viral copies (<250 copies / mL). A negative result must be combined with clinical observations, patient history, and epidemiological information. Fact Sheet for Patients:   StrictlyIdeas.no Fact Sheet for Healthcare Providers: BankingDealers.co.za This  test is not yet approved or cleared  by the [place] FDA and has been authorized for detection and/or diagnosis of SARS-CoV-2 by FDA under an Emergency Use Authorization (EUA).  This EUA will remain in effect (meaning this test can be used) for the duration of the COVID-19 declaration under Section 564(b)(1) of the Act, 21 U.S.C. section 360bbb-3(b)(1), unless the authorization is terminated or revoked sooner. Performed at [facility], [address], [place], [place] [address]   MRSA PCR Screening     Status: None   Collection Time: 07/13/19  4:55 AM   Specimen: Nasal Mucosa; Nasopharyngeal  Result Value Ref Range Status   MRSA by PCR NEGATIVE NEGATIVE Final    Comment:        The GeneXpert MRSA Assay (FDA approved for NASAL specimens only), is one component of a comprehensive MRSA colonization surveillance program. It is not intended to diagnose MRSA infection nor to guide or monitor treatment for MRSA infections. Performed at [facility], [address] [place], [place] [address]          Radiology Studies: No results found.      Scheduled Meds: . amLODipine  5  mg Oral Daily  . apixaban  5 mg Oral BID  . chlorhexidine  15 mL Mouth Rinse BID  . Chlorhexidine Gluconate Cloth  6 each Topical Daily  . ipratropium  0.5 mg Nebulization TID  . levalbuterol  0.63 mg Nebulization TID  . mouth rinse  15 mL Mouth Rinse q12n4p  . pantoprazole  40 mg Oral Daily  . sodium chloride flush  3 mL Intravenous Q12H   Continuous Infusions: . sodium chloride       LOS: 4 days    Time spent: 30 minutes    Tan Clopper Darleen Crocker, DO [facility]  If 7PM-7AM, please contact night-coverage [email or web address] 07/17/2019, 2:58 PM

## 2019-07-18 ENCOUNTER — Other Ambulatory Visit (HOSPITAL_COMMUNITY): Payer: Medicare Other

## 2019-07-18 ENCOUNTER — Ambulatory Visit (HOSPITAL_COMMUNITY): Payer: Medicare Other

## 2019-07-18 DIAGNOSIS — J9602 Acute respiratory failure with hypercapnia: Secondary | ICD-10-CM

## 2019-07-18 MED ORDER — PANTOPRAZOLE SODIUM 40 MG PO TBEC
40.0000 mg | DELAYED_RELEASE_TABLET | Freq: Two times a day (BID) | ORAL | Status: DC
  Administered 2019-07-18 – 2019-07-23 (×10): 40 mg via ORAL
  Filled 2019-07-18 (×10): qty 1

## 2019-07-18 NOTE — Progress Notes (Signed)
RN spoke with Dr. Manuella Ghazi- wants to keep patient in ICU d/t CPAP trial tonight. RN  Made MD aware that tele floor was able to take CPAP at night. Dr. Manuella Ghazi stated that Dr. Melvyn Novas concerned if CPAP did not work then patient would be hard to arouse in the am. Pt now changed to stepdown status. AC and CN made aware.

## 2019-07-18 NOTE — Consult Note (Signed)
NAME:  Carolyn Silva, MRN:  [id number], DOB:  Oct 07, 1950, LOS: 5 ADMISSION DATE:  07/12/2019, CONSULTATION DATE:  07/18/19 REFERRING MD:  Shah/ [facility], CHIEF COMPLAINT:  Sob    Brief History   86 yowf never smoker with h/o hbp and "asthma"  New dx afib admitted 5/28 with acute falre of sob with cxr evidence for chf and has become bipap dep at hs with new dx of hypercarbic resp failure / clinically with osa so PCCM service asked pm 6/3 to eval .  History of present illness   HPI: Carolyn Silva is a 69 y.o. female with medical history significant for asthma and hypertension, new diagnosis of atrial fibrillation >>> to   ED with complaints of difficulty breathing with exertion, palpitation and dizziness also with exertion, that improves with rest.  She reports wheezing also.   Patient was diagnosed with atrial fibrillation with RVR about 3 weeks PTA, she was placed started on Cardizem and Eliquis a week ago, she reports daily compliance with both, including today.  She is not on home O2.  Baseline one months PTA able to walk aisles at [employer] off  02, has inhaler uses once per month but gradually worse sob/leg swelling  since then with new dx of afib rvr and started on cardizem and eliquis   Minimal sporadic dry cough   No obvious day to day or daytime variability or assoc excess/ purulent sputum or mucus plugs or hemoptysis or cp or chest tightness, subjective wheeze or overt sinus or hb symptoms.   Sleeping flat bed/ 2 pillows  without nocturnal  or early am exacerbation  of respiratory  c/o's or need for noct saba. Also denies any obvious fluctuation of symptoms with weather or environmental changes or other aggravating or alleviating factors except as outlined above   No unusual exposure hx or h/o childhood pna/ asthma or knowledge of premature birth.  Current Allergies, Complete Past Medical History, Past Surgical History, Family History, and Social History were reviewed in [employer] record.  ROS  The following are not active complaints unless bolded Hoarseness, sore throat, dysphagia, dental problems, itching, sneezing,  nasal congestion or discharge of excess mucus or purulent secretions, ear ache,   fever, chills, sweats, unintended wt loss or wt gain, classically pleuritic or exertional cp,  orthopnea pnd or arm/hand swelling  or leg swelling, presyncope, palpitations, abdominal pain, anorexia, nausea, vomiting, diarrhea  or change in bowel habits or change in bladder habits, change in stools or change in urine, dysuria, hematuria,  rash, arthralgias, visual complaints, headache, numbness, weakness or ataxia or problems with walking or coordination,  change in mood or  memory.              Significant Hospital Events   Placed on bipap on admit 6/3  Changed to cpap autoset   Consults:  Cardiology PCCM   Procedures:    Significant Diagnostic Tests:  CTa 07/09/19 1. No evidence of pulmonary embolus. 2. Extensive thoracic spondylosis. Echo 07/11/19 1. Left ventricular ejection fraction, by estimation, is 65 to 70%. The  left ventricle has normal function. The left ventricle has no regional  wall motion abnormalities. There is mild left ventricular hypertrophy.  2. Right ventricular systolic function is normal. The right ventricular  size is normal.  3. No left atrial/left atrial appendage thrombus was detected. The LAA  emptying velocity was 40 cm/s.  4. The mitral valve is grossly normal. Trivial mitral valve  regurgitation.  5. The aortic valve is tricuspid. Aortic valve regurgitation is not  visualized.  6. Evidence of atrial level shunting detected by color flow Doppler.  There is a small atrial septal defect with predominantly left to right  shunting across the atrial septum.    Micro Data:  5/28 COVId  PCR  Neg 5/29 MRSA   PCR  Neg   Antimicrobials:      Scheduled Meds: . amLODipine  5 mg Oral Daily  . apixaban  5  mg Oral BID  . chlorhexidine  15 mL Mouth Rinse BID  . Chlorhexidine Gluconate Cloth  6 each Topical Daily  . ipratropium  0.5 mg Nebulization TID  . levalbuterol  0.63 mg Nebulization TID  . mouth rinse  15 mL Mouth Rinse q12n4p  . pantoprazole  40 mg Oral Daily  . sodium chloride flush  3 mL Intravenous Q12H   Continuous Infusions: . sodium chloride     PRN Meds:.sodium chloride, acetaminophen **OR** acetaminophen, butalbital-acetaminophen-caffeine, ondansetron **OR** ondansetron (ZOFRAN) IV, sodium chloride flush   Interim history/subjective:  Sitting in chair nad   Objective   Blood pressure (!) 155/74, pulse 94, temperature 98 F (36.7 C), temperature source Oral, resp. rate (!) 28, height 5\' 6"  (1.676 m), weight 134.2 kg, SpO2 93 %.        Intake/Output Summary (Last 24 hours) at 07/18/2019 1449 Last data filed at 07/18/2019 1306 Gross per 24 hour  Intake 720 ml  Output --  Net 720 ml   Filed Weights   07/13/19 0539 07/14/19 0500 07/15/19 0600  Weight: 132.2 kg 135.3 kg 134.2 kg    Examination: General: pleasant amb obese bf nad  Lungs: distant bs s crackles on insp/ wheeze on exp  Cardiovascular: RRR no s3   NSR on monitor  Abdomen: obese/ benign/ limited insp excursion Extremities: trace LE edema Neuro: sensorium intact gait slow but steady   During ambulation one lap around ICU slow pace, no desats on RA     I personally reviewed images and agree with radiology impression as follows:  CXR:   Portable 07/12/19 Mild cardiomegaly with findings suggestive of pulmonary hypertension. No focal consolidation.      Assessment & Plan:    1)  OHS with probable OSA  >>> would use cpap autoset for now until gets formal sleep study and f/u with sood or alva   2)  Chronic hypoxemic and hypercarbic respiratory failure secondary to #1  - HC03  07/14/19  = 36 so likely pC02 runs mid 50s to 60 at baselin - titrate 02 to sats > 88%  - no need to push above low 90s and if  the cpap autoset prevents desats to < 5 min total doesn't actually need 02 at all base on 02 sats walking today   3)  Likely has either very mild asthma or pseudoasthma either way this is a difficult to manage airway problem:  DDX of  difficult airways management almost all start with A and  include Adherence, Ace Inhibitors, Acid Reflux, Active Sinus Disease, Alpha 1 Antitripsin deficiency, Anxiety masquerading as Airways dz,  ABPA,  Allergy(esp in young), Aspiration (esp in elderly), Adverse effects of meds,  Active smoking or vaping, A bunch of PE's (a small clot burden can't cause this syndrome unless there is already severe underlying pulm or vascular dz with poor reserve) plus two Bs  = Bronchiectasis and Beta blocker use..and one C= CHF   -Adherence is always the initial "  prime suspect" and is a multilayered concern that requires a "trust but verify" approach in every patient - starting with knowing how to use medications, especially inhalers, correctly, keeping up with refills and understanding the fundamental difference between maintenance and prns vs those medications only taken for a very short course and then stopped and not refilled.  - will need to return to the pulmonary office with all meds in hand using a trust but verify approach to confirm accurate Medication  Reconciliation The principal here is that until we are certain that the  patients are doing what we've asked, it makes no sense to ask them to do more.   -ACEi adverse effects at the  top of the rest  usual list of suspects and the only way to rule it out is a trial off (see separate a/p)   ? Acid (or non-acid) GERD > always difficult to exclude as up to 75% of pts in some series report no assoc GI/ Heartburn symptoms> rec max (24h)  acid suppression    ? Allergy/asthma > ok to use saba prn for now  ?  A bunch of PE's > unlikely on eliquis  ? Chf/ cardiac asthma component in pt with new AF and chf appearance on cxr =  "cardac asthma"  - clearly better since admit but rec keep as dry as possible  - note L to R shunting by ASD not an issue for gas exchange     4) Morbid obesity Body mass index is 47.75 kg/m.    Lab Results  Component Value Date   TSH 1.908 07/09/2019    Contributing to gerd risk/ doe/ohs and ? osa >>> reviewed the need and the process to achieve and maintain neg calorie balance > defer f/u primary care including intermittently monitoring thyroid status      Labs   CBC: Recent Labs  Lab 07/12/19 2128 07/13/19 0546 07/14/19 0902  WBC 16.5* 15.4* 9.2  NEUTROABS 14.0*  --   --   HGB 11.8* 11.6* 11.2*  HCT 41.4 42.4 39.4  MCV 96.7 98.6 96.8  PLT 262 260 0000000    Basic Metabolic Panel: Recent Labs  Lab 07/12/19 2128 07/13/19 0546 07/14/19 0902  NA 140 141 142  K 4.2 4.7 4.4  CL 97* 97* 98  CO2 34* 35* 36*  GLUCOSE 116* 120* 117*  BUN 29* 28* 14  CREATININE 0.96 0.86 0.68  CALCIUM 8.4* 8.3* 8.3*   GFR: Estimated Creatinine Clearance: 96.2 mL/min (by C-G formula based on SCr of 0.68 mg/dL). Recent Labs  Lab 07/12/19 2128 07/13/19 0546 07/14/19 0902  WBC 16.5* 15.4* 9.2    Liver Function Tests: Recent Labs  Lab 07/13/19 0546  AST 29  ALT 40  ALKPHOS 77  BILITOT 0.5  PROT 6.7  ALBUMIN 3.4*   No results for input(s): LIPASE, AMYLASE in the last 168 hours. No results for input(s): AMMONIA in the last 168 hours.  ABG    Component Value Date/Time   PHART 7.421 07/16/2019 0920   PCO2ART 58.8 (H) 07/16/2019 0920   PO2ART 57.7 (L) 07/16/2019 0920   HCO3 35.1 (H) 07/16/2019 0920   O2SAT 89.2 07/16/2019 0920     Coagulation Profile: No results for input(s): INR, PROTIME in the last 168 hours.  Cardiac Enzymes: No results for input(s): CKTOTAL, CKMB, CKMBINDEX, TROPONINI in the last 168 hours.  HbA1C: No results found for: HGBA1C  CBG: No results for input(s): GLUCAP in the last 168 hours.  Past Medical History  She,  has a past medical  history of Asthma and Hypertension.   Surgical History    Past Surgical History:  Procedure Laterality Date  . CARDIOVERSION N/A 07/11/2019   Procedure: CARDIOVERSION;  Surgeon: Satira Sark, MD;  Location: [facility];  Service: Cardiovascular;  Laterality: N/A;  . COLONOSCOPY N/A 09/14/2017   Procedure: COLONOSCOPY;  Surgeon: Danie Binder, MD;  Location: [facility] SUITE;  Service: Endoscopy;  Laterality: N/A;  2:00  . TEE WITHOUT CARDIOVERSION N/A 07/11/2019   Procedure: TRANSESOPHAGEAL ECHOCARDIOGRAM (TEE) WITH PROPOFOL;  Surgeon: Satira Sark, MD;  Location: [facility];  Service: Cardiovascular;  Laterality: N/A;  . TUBAL LIGATION       Social History   reports that she has never smoked. She has never used smokeless tobacco. She reports current alcohol use. She reports that she does not use drugs.   Family History   Her family history includes Cirrhosis in her father; Diabetes in her brother and mother; Kidney disease in her sister. There is no history of Colon cancer or Colon polyps.   Allergies Allergies  Allergen Reactions  . Strawberry C [Ascorbic Acid] Hives     Home Medications  Prior to Admission medications   Medication Sig Start Date End Date Taking? Authorizing Provider  albuterol (VENTOLIN HFA) 108 (90 Base) MCG/ACT inhaler Inhale 1-2 puffs into the lungs every 6 (six) hours as needed for wheezing or shortness of breath. 07/11/19  Yes Johnson, Clanford L, MD  ELIQUIS 5 MG TABS tablet Take 5 mg by mouth 2 (two) times daily. 07/02/19  Yes [provider]  predniSONE (DELTASONE) 20 MG tablet Take 2 PO QAM x3days, 1 PO QAM x3days 07/12/19  Yes Murlean Iba, MD       Christinia Gully, MD Pulmonary and [facility] [phone] After 6:00 PM or weekends, use Beeper [phone]  After 7:00 pm call Elink  [phone]

## 2019-07-18 NOTE — Progress Notes (Signed)
PROGRESS NOTE    Carolyn Silva  [id number] DOB: 11/16/1950 DOA: 07/12/2019 PCP: Celene Squibb, MD   Brief Narrative:  As per H&P written by Dr. Darrick Meigs on 07/13/19 69 y.o.female,with history of asthma, hypertension, newly diagnosed atrial fibrillation on anticoagulation with Eliquis was brought to the ED with complaints of tachycardia and respiratory difficulty. Patient was discharged from hospital on May 27 after she was diagnosed with new onset A. fib and underwent successful cardioversion. Patient was discharged on apixaban. Patient's daughter is a respiratory therapist and went on to check her mother today. Patient seem to have respiratory difficulty. She placed her on oxygen saturation monitor and noted the patient's was tachycardic with O2 sats decreased. In the ED patient was found to be in A. fib with RVR with heart rate 140s. Patient underwent electrical cardioversion in the ED and tolerated procedure well. However she was noted to have end-tidal CO2 in the 70s. Patient underwent propofol sedation. ABG showed PCO2 of 97. Patient was alert and oriented x3. She was placed on BiPAP for acute respiratory acidosis. At this time patient is on BiPAP, denies any chest pain. Denies nausea or vomiting. No history of abdominal pain or dysuria.  -Plan is to trial CPAP tonight to see how well this is tolerated and then try to set up this device for at home use vs home oxygen.  Assessment & Plan:   Active Problems:   Acute respiratory failure with hypercapnia (HCC)   1-Acute respiratory failure with hypercapnia (HCC) -In the setting of obesity related thoracic restriction and obstructive sleep apnea -Patient willneedsleep study as an outpatient, and prior to that continue use of NIV  -Excellent response to the use of NIV having CO2 down into 58range. -Patient is alert, awake, oriented x3 and very interactive;. -Patient's niece expressed desaturation episodes while ambulating  as an outpatient.Home O2 screening demonstrated the need for 2L [place] with exertion. -continue QHS NIV -TOC to assist securing equipment for safe discharge, hopefully by a.m.  2-a. Fib with RVR-resolved -Post cardioversion in the [facility] 07/13/19 -Heart rate stableand sinus rhythmcurrently. -Continue Eliquis for secondary prevention -Has outpatient follow-up scheduled with cardiology service. -will make sure at discharge she follow up with monitoring event as previously prescribed.  3-morbid obesity: With undiagnosed obstructive sleep apnea and obesity hypoventilation syndrome. -Body mass index is 47.75 kg/m. -Low calorie diet, portion control increase physical activity discussed with patient. -Will need sleep study as an outpatient -Assess oxygen supplementation needs at rest and on exertion.  4-history of asthma -Stable medical condition without any wheezing or shortness of breath. -Continue monitoring -Continue home bronchodilators regimen. -not need for steroids therapy currently. -Keep off ACE inhibitor's  5-leukocytosis -Received steroids -Within normal limits currently -no signs of acute infections -No need for further antibiotics at this time.  6-GERD -continue PPI   DVT prophylaxis:eliquis. Code Status: Full Family Communication: None at bedside Disposition:  Status is: Inpatient  Dispo: The patient is from: home  Anticipated d/c is [id number]  Anticipated d/c date is: 1 day Patient currentlynot medically for discharge; patient will remain on CPAP with auto titration tonight with oxygen saturations to be evaluated in a.m. to see if patient will require home oxygen/CPAP machine.  Appreciate pulmonology recommendations.  Unsafe for discharge at this time.    Consultants:  Pulmonology  Procedures: See below for x-ray reports.   Antimicrobials:  None  Subjective: Patient seen and evaluated today  with no new acute complaints or concerns. No acute concerns or  events noted overnight.  She appears to sleep well each night using BiPAP machine.  Objective: Vitals:   07/18/19 1000 07/18/19 1200 07/18/19 1309 07/18/19 1456  BP: (!) 113/94 (!) 89/57 (!) 155/74   Pulse: 76 92 94   Resp: 18 20 (!) 28   Temp:  98 F (36.7 C)    TempSrc:  Oral    SpO2: 95% 95% 93% 96%  Weight:      Height:        Intake/Output Summary (Last 24 hours) at 07/18/2019 1534 Last data filed at 07/18/2019 1306 Gross per 24 hour  Intake 720 ml  Output --  Net 720 ml   Filed Weights   07/13/19 0539 07/14/19 0500 07/15/19 0600  Weight: 132.2 kg 135.3 kg 134.2 kg    Examination:  General exam: Appears calm and comfortable, obese Respiratory system: Clear to auscultation. Respiratory effort normal. Cardiovascular system: S1 & S2 heard, RRR. No JVD, murmurs, rubs, gallops or clicks. No pedal edema. Gastrointestinal system: Abdomen is nondistended, soft and nontender. No organomegaly or masses felt. Normal bowel sounds heard. Central nervous system: Alert and oriented. No focal neurological deficits. Extremities: Symmetric 5 x 5 power. Skin: No rashes, lesions or ulcers Psychiatry: Judgement and insight appear normal. Mood & affect appropriate.     Data Reviewed: I have personally reviewed following labs and imaging studies  CBC: Recent Labs  Lab 07/12/19 2128 07/13/19 0546 07/14/19 0902  WBC 16.5* 15.4* 9.2  NEUTROABS 14.0*  --   --   HGB 11.8* 11.6* 11.2*  HCT 41.4 42.4 39.4  MCV 96.7 98.6 96.8  PLT 262 260 0000000   Basic Metabolic Panel: Recent Labs  Lab 07/12/19 2128 07/13/19 0546 07/14/19 0902  NA 140 141 142  K 4.2 4.7 4.4  CL 97* 97* 98  CO2 34* 35* 36*  GLUCOSE 116* 120* 117*  BUN 29* 28* 14  CREATININE 0.96 0.86 0.68  CALCIUM 8.4* 8.3* 8.3*   GFR: Estimated Creatinine Clearance: 96.2 mL/min (by C-G formula based on SCr of 0.68 mg/dL). Liver Function Tests: Recent Labs  Lab  07/13/19 0546  AST 29  ALT 40  ALKPHOS 77  BILITOT 0.5  PROT 6.7  ALBUMIN 3.4*   No results for input(s): LIPASE, AMYLASE in the last 168 hours. No results for input(s): AMMONIA in the last 168 hours. Coagulation Profile: No results for input(s): INR, PROTIME in the last 168 hours. Cardiac Enzymes: No results for input(s): CKTOTAL, CKMB, CKMBINDEX, TROPONINI in the last 168 hours. BNP (last 3 results) No results for input(s): PROBNP in the last 8760 hours. HbA1C: No results for input(s): HGBA1C in the last 72 hours. CBG: No results for input(s): GLUCAP in the last 168 hours. Lipid Profile: No results for input(s): CHOL, HDL, LDLCALC, TRIG, CHOLHDL, LDLDIRECT in the last 72 hours. Thyroid Function Tests: No results for input(s): TSH, T4TOTAL, FREET4, T3FREE, THYROIDAB in the last 72 hours. Anemia Panel: No results for input(s): VITAMINB12, FOLATE, FERRITIN, TIBC, IRON, RETICCTPCT in the last 72 hours. Sepsis Labs: No results for input(s): PROCALCITON, LATICACIDVEN in the last 168 hours.  Recent Results (from the past 240 hour(s))  SARS Coronavirus 2 by RT PCR (hospital order, performed in [facility] hospital lab) Nasopharyngeal Nasopharyngeal Swab     Status: None   Collection Time: 07/09/19  4:00 PM   Specimen: Nasopharyngeal Swab  Result Value Ref Range Status   SARS Coronavirus 2 NEGATIVE NEGATIVE Final    Comment: (NOTE) SARS-CoV-2 target nucleic acids  are NOT DETECTED. The SARS-CoV-2 RNA is generally detectable in upper and lower respiratory specimens during the acute phase of infection. The lowest concentration of SARS-CoV-2 viral copies this assay can detect is 250 copies / mL. A negative result does not preclude SARS-CoV-2 infection and should not be used as the sole basis for treatment or other patient management decisions.  A negative result may occur with improper specimen collection / handling, submission of specimen other than nasopharyngeal swab, presence of  viral mutation(s) within the areas targeted by this assay, and inadequate number of viral copies (<250 copies / mL). A negative result must be combined with clinical observations, patient history, and epidemiological information. Fact Sheet for Patients:   StrictlyIdeas.no Fact Sheet for Healthcare Providers: BankingDealers.co.za This test is not yet approved or cleared  by the [place] FDA and has been authorized for detection and/or diagnosis of SARS-CoV-2 by FDA under an Emergency Use Authorization (EUA).  This EUA will remain in effect (meaning this test can be used) for the duration of the COVID-19 declaration under Section 564(b)(1) of the Act, 21 U.S.C. section 360bbb-3(b)(1), unless the authorization is terminated or revoked sooner. Performed at [facility], [address] [place], [place] [address]   SARS Coronavirus 2 by RT PCR (hospital order, performed in [facility] hospital lab) Nasopharyngeal Nasopharyngeal Swab     Status: None   Collection Time: 07/12/19 11:45 PM   Specimen: Nasopharyngeal Swab  Result Value Ref Range Status   SARS Coronavirus 2 NEGATIVE NEGATIVE Final    Comment: (NOTE) SARS-CoV-2 target nucleic acids are NOT DETECTED. The SARS-CoV-2 RNA is generally detectable in upper and lower respiratory specimens during the acute phase of infection. The lowest concentration of SARS-CoV-2 viral copies this assay can detect is 250 copies / mL. A negative result does not preclude SARS-CoV-2 infection and should not be used as the sole basis for treatment or other patient management decisions.  A negative result may occur with improper specimen collection / handling, submission of specimen other than nasopharyngeal swab, presence of viral mutation(s) within the areas targeted by this assay, and inadequate number of viral copies (<250 copies / mL). A negative result must be combined with clinical observations,  patient history, and epidemiological information. Fact Sheet for Patients:   StrictlyIdeas.no Fact Sheet for Healthcare Providers: BankingDealers.co.za This test is not yet approved or cleared  by the [place] FDA and has been authorized for detection and/or diagnosis of SARS-CoV-2 by FDA under an Emergency Use Authorization (EUA).  This EUA will remain in effect (meaning this test can be used) for the duration of the COVID-19 declaration under Section 564(b)(1) of the Act, 21 U.S.C. section 360bbb-3(b)(1), unless the authorization is terminated or revoked sooner. Performed at [facility], [address], [place], [place] [address]   MRSA PCR Screening     Status: None   Collection Time: 07/13/19  4:55 AM   Specimen: Nasal Mucosa; Nasopharyngeal  Result Value Ref Range Status   MRSA by PCR NEGATIVE NEGATIVE Final    Comment:        The GeneXpert MRSA Assay (FDA approved for NASAL specimens only), is one component of a comprehensive MRSA colonization surveillance program. It is not intended to diagnose MRSA infection nor to guide or monitor treatment for MRSA infections. Performed at [facility], [address] [place],  [address]          Radiology Studies: No results found.      Scheduled Meds: .  amLODipine  5 mg Oral Daily  . apixaban  5 mg Oral BID  . chlorhexidine  15 mL Mouth Rinse BID  . Chlorhexidine Gluconate Cloth  6 each Topical Daily  . ipratropium  0.5 mg Nebulization TID  . levalbuterol  0.63 mg Nebulization TID  . mouth rinse  15 mL Mouth Rinse q12n4p  . pantoprazole  40 mg Oral BID AC  . sodium chloride flush  3 mL Intravenous Q12H   Continuous Infusions: . sodium chloride       LOS: 5 days    Time spent: 30 minutes    Elizah Mierzwa Darleen Crocker, DO [facility]  If 7PM-7AM, please contact night-coverage [email or web address] 07/18/2019, 3:34 PM

## 2019-07-18 NOTE — Progress Notes (Signed)
Report given to  Destiny, RN to transfer patient to room 328. RN spoke to Dr Manuella Ghazi to make aware and we will hold off at this time d/t possibility of discharge. AC Abby made aware as well as Landscape architect.

## 2019-07-18 NOTE — Progress Notes (Signed)
SATURATION QUALIFICATIONS: (This note is used to comply with regulatory documentation for home oxygen)  Patient Saturations on Room Air at Rest = 98%  Patient Saturations on Room Air while Ambulating = 99-100%  Patient Saturations on 2Liters of oxygen while Ambulating = No oxygen while ambulating  Please briefly explain why patient needs home oxygen: Will do CPAP trial tonight to see if patient maintains CPAP or needs oxygen while at home.

## 2019-07-19 ENCOUNTER — Inpatient Hospital Stay (HOSPITAL_COMMUNITY): Payer: Medicare Other

## 2019-07-19 NOTE — Progress Notes (Signed)
PROGRESS NOTE    Carolyn Silva  ION:[id number] DOB: 15-May-1950 DOA: 07/12/2019 PCP: Celene Squibb, MD   Brief Narrative:  As per H&P written by Dr. Darrick Meigs on 07/13/19 69 y.o.female,with history of asthma, hypertension, newly diagnosed atrial fibrillation on anticoagulation with Eliquis was brought to the ED with complaints of tachycardia and respiratory difficulty. Patient was discharged from hospital on May 27 after she was diagnosed with new onset A. fib and underwent successful cardioversion. Patient was discharged on apixaban. Patient's daughter is a respiratory therapist and went on to check her mother today. Patient seem to have respiratory difficulty. She placed her on oxygen saturation monitor and noted the patient's was tachycardic with O2 sats decreased. In the ED patient was found to be in A. fib with RVR with heart rate 140s. Patient underwent electrical cardioversion in the ED and tolerated procedure well. However she was noted to have end-tidal CO2 in the 70s. Patient underwent propofol sedation. ABG showed PCO2 of 97. Patient was alert and oriented x3. She was placed on BiPAP for acute respiratory acidosis. At this time patient is on BiPAP, denies any chest pain. Denies nausea or vomiting. No history of abdominal pain or dysuria.  -Plan is to have patient remain on BiPAP overnight and use oxygen as needed with further evaluation pending by pulmonology by Monday.  Assessment & Plan:   Active Problems:   Acute respiratory failure with hypercapnia (HCC)   1-Acute respiratory failure with hypercapnia (HCC) -In the setting ofobesity related thoracic restrictionand obstructive sleep apnea -Patient willneedsleep study as an outpatient, and prior to that continue use ofNIV -Excellent response to the use ofNIVhaving CO2 down into 58range. -Patient is alert, awake, oriented x3 and very interactive;. -Patient's niece expressed desaturation episodes while  ambulating as an outpatient.Home O2 screening demonstrated the need for 2L [place] with exertion. -continue QHSNIV -TOC to assist securing equipment for safe discharge,hopefully by a.m.  2-a. Fib with RVR-resolved -Post cardioversion in the [facility] 07/13/19 -Heart rate stableand sinus rhythmcurrently. -Continue Eliquis for secondary prevention -Has outpatient follow-up scheduled with cardiology service. -will make sure at discharge she follow up with monitoring event as previously prescribed.  3-morbid obesity: With undiagnosed obstructive sleep apnea and obesity hypoventilation syndrome. -Body mass index is 47.75 kg/m. -Low calorie diet, portion control increase physical activity discussed with patient. -Will need sleep study as an outpatient -Assess oxygen supplementation needs at rest and on exertion.  4-history of asthma -Stable medical condition without any wheezing or shortness of breath. -Continue monitoring -Continue home bronchodilators regimen. -not need for steroids therapy currently. -Keep off ACE inhibitor's  5-leukocytosis -Received steroids -Within normal limits currently -no signs of acute infections -No need for further antibiotics at this time.  6-GERD -continue PPI   DVT prophylaxis:eliquis. Code Status:Full Family Communication:None at bedside Disposition:  Status is: Inpatient  Dispo: The patient is from: home  Anticipated d/c is KG:MWNU  Anticipated d/c date is:3 dayd Patient currentlynot medically for discharge;patient will remain on BiPAP with overnight oximetry study.  Pulmonology working on noninvasive ventilation options.  Appreciate ongoing pulmonology recommendations.  Unsafe for discharge at this time, but okay for transfer to [place] floor.    Consultants:  Pulmonology  Procedures: See below for x-ray reports.   Antimicrobials:  None  Subjective: Patient seen and  evaluated today with no new acute complaints or concerns. No acute concerns or events noted overnight.  She required a significant amount of oxygen supplementation with her CPAP last night.  Objective: Vitals:  07/19/19 1158 07/19/19 1200 07/19/19 1400 07/19/19 1420  BP:  (!) 157/87 (!) 168/72   Pulse:  89 78   Resp:  (!) 21 (!) 21   Temp: 98 F (36.7 C)     TempSrc: Oral     SpO2:  91% 93% 93%  Weight:      Height:        Intake/Output Summary (Last 24 hours) at 07/19/2019 1436 Last data filed at 07/18/2019 1752 Gross per 24 hour  Intake 640 ml  Output --  Net 640 ml   Filed Weights   07/14/19 0500 07/15/19 0600 07/19/19 0500  Weight: 135.3 kg 134.2 kg 134.8 kg    Examination:  General exam: Appears calm and comfortable, obese Respiratory system: Clear to auscultation. Respiratory effort normal. Cardiovascular system: S1 & S2 heard, RRR. No JVD, murmurs, rubs, gallops or clicks. No pedal edema. Gastrointestinal system: Abdomen is nondistended, soft and nontender. No organomegaly or masses felt. Normal bowel sounds heard. Central nervous system: Alert and oriented. No focal neurological deficits. Extremities: Symmetric 5 x 5 power. Skin: No rashes, lesions or ulcers Psychiatry: Judgement and insight appear normal. Mood & affect appropriate.     Data Reviewed: I have personally reviewed following labs and imaging studies  CBC: Recent Labs  Lab 07/12/19 2128 07/13/19 0546 07/14/19 0902  WBC 16.5* 15.4* 9.2  NEUTROABS 14.0*  --   --   HGB 11.8* 11.6* 11.2*  HCT 41.4 42.4 39.4  MCV 96.7 98.6 96.8  PLT 262 260 270   Basic Metabolic Panel: Recent Labs  Lab 07/12/19 2128 07/13/19 0546 07/14/19 0902  NA 140 141 142  K 4.2 4.7 4.4  CL 97* 97* 98  CO2 34* 35* 36*  GLUCOSE 116* 120* 117*  BUN 29* 28* 14  CREATININE 0.96 0.86 0.68  CALCIUM 8.4* 8.3* 8.3*   GFR: Estimated Creatinine Clearance: 96.4 mL/min (by C-G formula based on SCr of 0.68 mg/dL). Liver  Function Tests: Recent Labs  Lab 07/13/19 0546  AST 29  ALT 40  ALKPHOS 77  BILITOT 0.5  PROT 6.7  ALBUMIN 3.4*   No results for input(s): LIPASE, AMYLASE in the last 168 hours. No results for input(s): AMMONIA in the last 168 hours. Coagulation Profile: No results for input(s): INR, PROTIME in the last 168 hours. Cardiac Enzymes: No results for input(s): CKTOTAL, CKMB, CKMBINDEX, TROPONINI in the last 168 hours. BNP (last 3 results) No results for input(s): PROBNP in the last 8760 hours. HbA1C: No results for input(s): HGBA1C in the last 72 hours. CBG: No results for input(s): GLUCAP in the last 168 hours. Lipid Profile: No results for input(s): CHOL, HDL, LDLCALC, TRIG, CHOLHDL, LDLDIRECT in the last 72 hours. Thyroid Function Tests: No results for input(s): TSH, T4TOTAL, FREET4, T3FREE, THYROIDAB in the last 72 hours. Anemia Panel: No results for input(s): VITAMINB12, FOLATE, FERRITIN, TIBC, IRON, RETICCTPCT in the last 72 hours. Sepsis Labs: No results for input(s): PROCALCITON, LATICACIDVEN in the last 168 hours.  Recent Results (from the past 240 hour(s))  SARS Coronavirus 2 by RT PCR (hospital order, performed in [facility] hospital lab) Nasopharyngeal Nasopharyngeal Swab     Status: None   Collection Time: 07/09/19  4:00 PM   Specimen: Nasopharyngeal Swab  Result Value Ref Range Status   SARS Coronavirus 2 NEGATIVE NEGATIVE Final    Comment: (NOTE) SARS-CoV-2 target nucleic acids are NOT DETECTED. The SARS-CoV-2 RNA is generally detectable in upper and lower respiratory specimens during the acute phase  of infection. The lowest concentration of SARS-CoV-2 viral copies this assay can detect is 250 copies / mL. A negative result does not preclude SARS-CoV-2 infection and should not be used as the sole basis for treatment or other patient management decisions.  A negative result may occur with improper specimen collection / handling, submission of specimen  other than nasopharyngeal swab, presence of viral mutation(s) within the areas targeted by this assay, and inadequate number of viral copies (<250 copies / mL). A negative result must be combined with clinical observations, patient history, and epidemiological information. Fact Sheet for Patients:   StrictlyIdeas.no Fact Sheet for Healthcare Providers: BankingDealers.co.za This test is not yet approved or cleared  by the [place] FDA and has been authorized for detection and/or diagnosis of SARS-CoV-2 by FDA under an Emergency Use Authorization (EUA).  This EUA will remain in effect (meaning this test can be used) for the duration of the COVID-19 declaration under Section 564(b)(1) of the Act, 21 U.S.C. section 360bbb-3(b)(1), unless the authorization is terminated or revoked sooner. Performed at [facility], [address], [place], [place] [address]   SARS Coronavirus 2 by RT PCR (hospital order, performed in [facility] hospital lab) Nasopharyngeal Nasopharyngeal Swab     Status: None   Collection Time: 07/12/19 11:45 PM   Specimen: Nasopharyngeal Swab  Result Value Ref Range Status   SARS Coronavirus 2 NEGATIVE NEGATIVE Final    Comment: (NOTE) SARS-CoV-2 target nucleic acids are NOT DETECTED. The SARS-CoV-2 RNA is generally detectable in upper and lower respiratory specimens during the acute phase of infection. The lowest concentration of SARS-CoV-2 viral copies this assay can detect is 250 copies / mL. A negative result does not preclude SARS-CoV-2 infection and should not be used as the sole basis for treatment or other patient management decisions.  A negative result may occur with improper specimen collection / handling, submission of specimen other than nasopharyngeal swab, presence of viral mutation(s) within the areas targeted by this assay, and inadequate number of viral copies (<250 copies / mL). A negative result must  be combined with clinical observations, patient history, and epidemiological information. Fact Sheet for Patients:   StrictlyIdeas.no Fact Sheet for Healthcare Providers: BankingDealers.co.za This test is not yet approved or cleared  by the [place] FDA and has been authorized for detection and/or diagnosis of SARS-CoV-2 by FDA under an Emergency Use Authorization (EUA).  This EUA will remain in effect (meaning this test can be used) for the duration of the COVID-19 declaration under Section 564(b)(1) of the Act, 21 U.S.C. section 360bbb-3(b)(1), unless the authorization is terminated or revoked sooner. Performed at [facility], [address], [place], [place] [address]   MRSA PCR Screening     Status: None   Collection Time: 07/13/19  4:55 AM   Specimen: Nasal Mucosa; Nasopharyngeal  Result Value Ref Range Status   MRSA by PCR NEGATIVE NEGATIVE Final    Comment:        The GeneXpert MRSA Assay (FDA approved for NASAL specimens only), is one component of a comprehensive MRSA colonization surveillance program. It is not intended to diagnose MRSA infection nor to guide or monitor treatment for MRSA infections. Performed at [facility], [address], [place], [place] [address]          Radiology Studies: DG Chest 2 View  Result Date: 07/19/2019 CLINICAL DATA:  Shortness of breath EXAM: CHEST - 2 VIEW COMPARISON:  Jul 12, 2019 chest radiograph and chest CT Jul 09, 2019 FINDINGS: There is atelectatic change in the left lower lung region. The lungs elsewhere are clear. Heart is upper normal in size with pulmonary vascularity normal. No evident adenopathy. There is degenerative change in the thoracic spine. IMPRESSION: Left lower lung region atelectatic change. No edema or airspace opacity. Heart size within normal limits. No adenopathy demonstrable. Electronically Signed   By: Lowella Grip III M.D.   On: 07/19/2019 10:47         Scheduled Meds: . amLODipine  5 mg Oral Daily  . apixaban  5 mg Oral BID  . chlorhexidine  15 mL Mouth Rinse BID  . Chlorhexidine Gluconate Cloth  6 each Topical Daily  . ipratropium  0.5 mg Nebulization TID  . levalbuterol  0.63 mg Nebulization TID  . mouth rinse  15 mL Mouth Rinse q12n4p  . pantoprazole  40 mg Oral BID AC  . sodium chloride flush  3 mL Intravenous Q12H   Continuous Infusions: . sodium chloride       LOS: 6 days    Time spent: 30 minutes    [place] Darleen Crocker, DO [facility]  If 7PM-7AM, please contact night-coverage [email or web address] 07/19/2019, 2:36 PM

## 2019-07-19 NOTE — Progress Notes (Signed)
Pt was placed on CPAP and was on room air with a CPAP 10cm: an hour later patient saturations dropped into the 60s and 70s and maintained a steady waveform. RT bled O2 into unit starting at 4lpm and increased to 12lpm for saturations up to 99%. RT and nursing will continue to monitor pt status.

## 2019-07-19 NOTE — Care Management Important Message (Signed)
Important Message  Patient Details  Name: Carolyn Silva MRN: [id number] Date of Birth: 1950-04-16   Medicare Important Message Given:  Yes     Tommy Medal 07/19/2019, 2:48 PM

## 2019-07-19 NOTE — Progress Notes (Signed)
NAME:  Carolyn Silva, MRN:  [id number], DOB:  1950/04/10, LOS: 6 ADMISSION DATE:  07/12/2019, CONSULTATION DATE:  07/18/19 REFERRING MD:  Shah/ [facility], CHIEF COMPLAINT:  Sob    Brief History   60 yowf never smoker with h/o hbp and "asthma"  New dx afib admitted 5/25-27/21 with sob / wheeze and cardioverted 5/27  rx as asthma d/c on prednsione/eliquis  prn saba and no 02 then returned  5/28 with acute flair  of sob with cxr evidence for chf and has become bipap dep at hs with new dx of hypercarbic resp failure / clinically with osa so PCCM service asked pm 6/3 to eval .  History of present illness   HPI:    69 y.o. female, with history of asthma, hypertension, newly diagnosed atrial fibrillation on anticoagulation with Eliquis was brought to the ED with complaints of tachycardia and respiratory difficulty.  Patient was discharged from hospital on May 27 after she was diagnosed with new onset A. fib and underwent successful cardioversion.  Patient was discharged on apixaban.  Patient's daughter is a respiratory therapist and  placed her on oxygen saturation monitor and noted the patient's was tachycardic with O2 sats decreased. In the ED patient was found to be in A. fib with RVR with heart rate 140s.  Patient underwent electrical cardioversion in the ED and tolerated procedure well.  However she was noted to have end-tidal CO2 in the 70s.  Patient underwent propofol sedation.  ABG showed PCO2 of 97.  Patient was alert and oriented x3.  She was placed on BiPAP for acute respiratory acidosis.    Baseline one months PTA able to walk aisles at [employer] off  02, has inhaler uses once per month but gradually worse sob/leg swelling  since then with new dx of afib rvr and started as outpt on cardizem and eliquis but on ACEi previously as well.   Minimal sporadic dry cough   Note baseline HC03 07/09/19 = 32 and she was not polycythemic   No obvious day to day or daytime variability or assoc excess/  purulent sputum or mucus plugs or hemoptysis or cp or chest tightness, subjective wheeze or overt sinus or hb symptoms.   Sleeping flat bed/ 2 pillows  without nocturnal  or early am exacerbation  of respiratory  c/o's or need for noct saba. Also denies any obvious fluctuation of symptoms with weather or environmental changes or other aggravating or alleviating factors except as outlined above   No unusual exposure hx or h/o childhood pna/ asthma or knowledge of premature birth.  Current Allergies, Complete Past Medical History, Past Surgical History, Family History, and Social History were reviewed in [employer] record.  ROS  The following are not active complaints unless bolded Hoarseness, sore throat, dysphagia, dental problems, itching, sneezing,  nasal congestion or discharge of excess mucus or purulent secretions, ear ache,   fever, chills, sweats, unintended wt loss or wt gain, classically pleuritic or exertional cp,  orthopnea pnd or arm/hand swelling  or leg swelling, presyncope, palpitations, abdominal pain, anorexia, nausea, vomiting, diarrhea  or change in bowel habits or change in bladder habits, change in stools or change in urine, dysuria, hematuria,  rash, arthralgias, visual complaints, headache, numbness, weakness or ataxia or problems with walking or coordination,  change in mood or  memory.              Significant Hospital Events   Placed on bipap on admit p diprovan  in ER for cardioversion 6/3  Changed to cpap autoset > set on cpap 10 per RT notes then required up to 12 lpm "to get sats of 100%"  6/4  Restart bipap per RT but adjust this first then add 02 only if sats < 89%  Consults:  Cardiology PCCM   Procedures:    Significant Diagnostic Tests:  CTa 07/09/19 1. No evidence of pulmonary embolus. 2. Extensive thoracic spondylosis. Echo 07/11/19 1. Left ventricular ejection fraction, by estimation, is 65 to 70%. The  left ventricle has normal  function. The left ventricle has no regional  wall motion abnormalities. There is mild left ventricular hypertrophy.  2. Right ventricular systolic function is normal. The right ventricular  size is normal.  3. No left atrial/left atrial appendage thrombus was detected. The LAA  emptying velocity was 40 cm/s.  4. The mitral valve is grossly normal. Trivial mitral valve  regurgitation.  5. The aortic valve is tricuspid. Aortic valve regurgitation is not  visualized.  6. Evidence of atrial level shunting detected by color flow Doppler.  There is a small atrial septal defect with predominantly left to right  shunting across the atrial septum.    Micro Data:  5/28 COVId  PCR  Neg 5/29 MRSA   PCR  Neg   Antimicrobials:      Scheduled Meds: . amLODipine  5 mg Oral Daily  . apixaban  5 mg Oral BID  . chlorhexidine  15 mL Mouth Rinse BID  . Chlorhexidine Gluconate Cloth  6 each Topical Daily  . ipratropium  0.5 mg Nebulization TID  . levalbuterol  0.63 mg Nebulization TID  . mouth rinse  15 mL Mouth Rinse q12n4p  . pantoprazole  40 mg Oral BID AC  . sodium chloride flush  3 mL Intravenous Q12H   Continuous Infusions: . sodium chloride     PRN Meds:.sodium chloride, acetaminophen **OR** acetaminophen, ondansetron **OR** ondansetron (ZOFRAN) IV, sodium chloride flush   Interim history/subjective:  Sitting up right in chair eating regular diet   Objective   Blood pressure (!) 157/87, pulse 89, temperature 98 F (36.7 C), temperature source Oral, resp. rate (!) 21, height 5\' 6"  (1.676 m), weight 134.8 kg, SpO2 91 %.    FiO2 (%):  [28 %] 28 %   Intake/Output Summary (Last 24 hours) at 07/19/2019 1325 Last data filed at 07/18/2019 1752 Gross per 24 hour  Intake 640 ml  Output --  Net 640 ml   Filed Weights   07/14/19 0500 07/15/19 0600 07/19/19 0500  Weight: 135.3 kg 134.2 kg 134.8 kg    Examination: Tmax 98.8 Pt alert, approp nad @ sitting in Chair RA  No  jvd Oropharynx clear,  mucosa nl Neck supple Lungs with distant bs bilaterally RRR no s3 or or sign murmur Abd quite  obese with limited  excursion  Extr warm with no edema or clubbing noted Neuro  Sensorium intact,  no apparent motor deficits       CXR PA and Lateral:   07/12/2019 :    I personally reviewed images and   impression as follows:    Left lower lung region atelectatic change. No edema or airspace opacity. Heart size within normal limits. No adenopathy demonstrable.      Assessment & Plan:    1)  OHS with ? OSA (we have not established the latter  >>>  Failed cpap 10 6/3 so rec bipap adjust per RT and  Then add 02 if sats <  89%    2)  Chronic hypoxemic and hypercarbic respiratory failure secondary to #1  - HC03  07/09/19 = 31 so probably only mild hypercarbic resp failure at baseline but do not understand why she suddenly needs "12 lpm to achieve sats of 100%" per RT notes when she doesn't need it walking the floor  ? Due to atx from obesity in supine position > Bipap and hs and IS during the day ? From ASD causing R to L shunting when hypoxic pulmonary vasocontriction drives up PA pressures while sleeping which should be corrected with bipap first and 02 added if still needed      3)  Likely has either very mild asthma or pseudoasthma either way this is a difficult to manage airway problem:  DDX of  difficult airways management almost all start with A and  include Adherence, Ace Inhibitors, Acid Reflux, Active Sinus Disease, Alpha 1 Antitripsin deficiency, Anxiety masquerading as Airways dz,  ABPA,  Allergy(esp in young), Aspiration (esp in elderly), Adverse effects of meds,  Active smoking or vaping, A bunch of PE's (a small clot burden can't cause this syndrome unless there is already severe underlying pulm or vascular dz with poor reserve) plus two Bs  = Bronchiectasis and Beta blocker use..and one C= CHF   -Adherence is always the initial "prime suspect" and is a  multilayered concern that requires a "trust but verify" approach in every patient - starting with knowing how to use medications, especially inhalers, correctly, keeping up with refills and understanding the fundamental difference between maintenance and prns vs those medications only taken for a very short course and then stopped and not refilled.  - will need to return to the pulmonary office with all meds in hand using a trust but verify approach to confirm accurate Medication  Reconciliation The principal here is that until we are certain that the  patients are doing what we've asked, it makes no sense to ask them to do more.   -ACEi adverse effects at the  top of the rest  usual list of suspects and the only way to rule out is to leave off for now and use ARB or CCB instead.  ? Acid (or non-acid) GERD > always difficult to exclude as up to 75% of pts in some series report no assoc GI/ Heartburn symptoms> rec max (24h)  acid suppression    ? Allergy/asthma > ok to use saba prn for now  ?  A bunch of PE's > unlikely on eliquis  ? Chf/ cardiac asthma component in pt with new AF and chf appearance on cxr = "cardac asthma"  - clearly better since admit but rec keep as dry as possible  - note L to R shunting by ASD not an issue for gas exchange ? Could she be intermittently R to L ?    4) Morbid obesity Body mass index is 47.97 kg/m.    Lab Results  Component Value Date   TSH 1.908 07/09/2019    Contributing to gerd risk/ doe/ohs and ? osa >>> reviewed the need and the process to achieve and maintain neg calorie balance > defer f/u primary care including intermittently monitoring thyroid status    >>> dietician to see for wt loss diet  Labs   CBC: Recent Labs  Lab 07/12/19 2128 07/13/19 0546 07/14/19 0902  WBC 16.5* 15.4* 9.2  NEUTROABS 14.0*  --   --   HGB 11.8* 11.6* 11.2*  HCT 41.4 42.4 39.4  MCV 96.7 98.6 96.8  PLT 262 260 403    Basic Metabolic Panel: Recent Labs  Lab  07/12/19 2128 07/13/19 0546 07/14/19 0902  NA 140 141 142  K 4.2 4.7 4.4  CL 97* 97* 98  CO2 34* 35* 36*  GLUCOSE 116* 120* 117*  BUN 29* 28* 14  CREATININE 0.96 0.86 0.68  CALCIUM 8.4* 8.3* 8.3*   GFR: Estimated Creatinine Clearance: 96.4 mL/min (by C-G formula based on SCr of 0.68 mg/dL). Recent Labs  Lab 07/12/19 2128 07/13/19 0546 07/14/19 0902  WBC 16.5* 15.4* 9.2    Liver Function Tests: Recent Labs  Lab 07/13/19 0546  AST 29  ALT 40  ALKPHOS 77  BILITOT 0.5  PROT 6.7  ALBUMIN 3.4*   No results for input(s): LIPASE, AMYLASE in the last 168 hours. No results for input(s): AMMONIA in the last 168 hours.  ABG    Component Value Date/Time   PHART 7.421 07/16/2019 0920   PCO2ART 58.8 (H) 07/16/2019 0920   PO2ART 57.7 (L) 07/16/2019 0920   HCO3 35.1 (H) 07/16/2019 0920   O2SAT 89.2 07/16/2019 0920     Coagulation Profile: No results for input(s): INR, PROTIME in the last 168 hours.  Cardiac Enzymes: No results for input(s): CKTOTAL, CKMB, CKMBINDEX, TROPONINI in the last 168 hours.  HbA1C: No results found for: HGBA1C  CBG: No results for input(s): GLUCAP in the last 168 hours.         Christinia Gully, MD Pulmonary and [place] [phone] After 6:00 PM or weekends, use Beeper [phone]  After 7:00 pm call Elink  [phone]

## 2019-07-19 NOTE — TOC Progression Note (Signed)
Transition of Care [facility]) - Progression Note   Patient Details  Name: Carolyn Silva MRN: [id number] Date of Birth: Apr 07, 1950  Transition of Care [facility]) CM/SW [place], LCSW Phone Number: 07/19/2019, 10:56 AM  Clinical Narrative: CSW spoke with Juliann Pulse from [place] regarding the patient needing Bipap. Per Juliann Pulse, if [facility] will provide an LOG for the first 3 months, the patient can be discharged with Bipap once a sleep study is scheduled. CSW followed up with supervisor and was informed that as the patient has insurance, [facility] cannot provide an LOG. Hospitalist and [place] updated.    Expected Discharge Plan: [place] Barriers to Discharge: Continued Medical Work up  Expected Discharge Plan and Services Expected Discharge Plan: [place] arrangements for the past 2 months: Single Family Home             DME Arranged: NIV DME Agency: [employer] Date DME Agency Contacted: 07/16/19 Time DME Agency Contacted: [phone]  Readmission Risk Interventions No flowsheet data found.

## 2019-07-20 MED ORDER — AMLODIPINE BESYLATE 5 MG PO TABS
10.0000 mg | ORAL_TABLET | Freq: Every day | ORAL | Status: DC
  Administered 2019-07-20 – 2019-07-22 (×3): 10 mg via ORAL
  Filled 2019-07-20 (×4): qty 2

## 2019-07-20 NOTE — Progress Notes (Signed)
Patient transported via wheelchair to 309 in stable condition. Report given to [employer], [place].

## 2019-07-20 NOTE — Progress Notes (Signed)
PROGRESS NOTE    [name]  MPN:[id number] DOB: 12-May-1950 DOA: 07/12/2019 PCP: Celene Squibb, MD   Brief Narrative:  As per H&P written by Dr. Darrick Meigs on 07/13/19 69 y.o.female,with history of asthma, hypertension, newly diagnosed atrial fibrillation on anticoagulation with Eliquis was brought to the ED with complaints of tachycardia and respiratory difficulty. Patient was discharged from hospital on May 27 after she was diagnosed with new onset A. fib and underwent successful cardioversion. Patient was discharged on apixaban. Patient's daughter is a respiratory therapist and went on to check her mother today. Patient seem to have respiratory difficulty. She placed her on oxygen saturation monitor and noted the patient's was tachycardic with O2 sats decreased. In the ED patient was found to be in A. fib with RVR with heart rate 140s. Patient underwent electrical cardioversion in the ED and tolerated procedure well. However she was noted to have end-tidal CO2 in the 70s. Patient underwent propofol sedation. ABG showed PCO2 of 97. Patient was alert and oriented x3. She was placed on BiPAP for acute respiratory acidosis. At this time patient is on BiPAP, denies any chest pain. Denies nausea or vomiting. No history of abdominal pain or dysuria.  -Plan is to have patient remain on BiPAP overnight and use oxygen as needed with further evaluation pending by pulmonology by Monday.  Assessment & Plan:   Active Problems:   Acute respiratory failure with hypercapnia (HCC)   1-Acute respiratory failure with hypercapnia (HCC) -In the setting ofobesity related thoracic restrictionand obstructive sleep apnea -Patient willneedsleep study as an outpatient, and prior to that continue use ofNIV -Excellent response to the use ofNIVhaving CO2 down into 58range. -Patient is alert, awake, oriented x3 and very interactive;. -Patient's niece expressed desaturation episodes while  ambulating as an outpatient.Home O2 screening demonstrated the need for 2L [place] with exertion. -continue QHSNIV -Patient will require further evaluation by pulmonology/sleep medicine by 6/7 for hopeful NIV at home so she can discharge  2-a. Fib with RVR-resolved -Post cardioversion in the EDon 07/13/19 -Heart rate stableand sinus rhythmcurrently. -Continue Eliquis for secondary prevention -Has outpatient follow-up scheduled with cardiology service. -will make sure at discharge she follow up with monitoring event as previously prescribed.  3-morbid obesity: With undiagnosed obstructive sleep apnea and obesity hypoventilation syndrome. -Body mass index is 47.75 kg/m. -Low calorie diet, portion control increase physical activity discussed with patient. -Will need sleep study as an outpatient -Assess oxygen supplementation needs at rest and on exertion.  4-history of asthma -Stable medical condition without any wheezing or shortness of breath. -Continue monitoring -Continue home bronchodilators regimen. -not need for steroids therapy currently. -Keep off ACE inhibitor's  5-leukocytosis -Received steroids -Within normal limits currently -no signs of acute infections -No need for further antibiotics at this time.  6-GERD -continue PPI   DVT prophylaxis:eliquis. Code Status:Full Family Communication:None at bedside Disposition:  Status is: Inpatient  Dispo: The patient is from: home  Anticipated d/c is MG:QQPY  Anticipated d/c date is:2 days Patient currentlynot medically for discharge;patientwill remain on BiPAP with overnight oximetry study.  Pulmonology working on noninvasive ventilation options. Appreciate ongoing pulmonology recommendations. Unsafe for discharge at this time and will require further pulmonology/sleep medicine evaluation by 6/7 to arrange for home machine prior to  discharge.    Consultants:  Pulmonology  Procedures: See below for x-ray reports.   Antimicrobials:  None  Subjective: Patient seen and evaluated today with no new acute complaints or concerns. No acute concerns or events noted overnight.  She apparently slept well on her BiPAP with FiO2 21% overnight.  No shortness of breath or other issues this morning.  Objective: Vitals:   07/20/19 0256 07/20/19 0533 07/20/19 0608 07/20/19 0805  BP:  (!) 140/123    Pulse: 72 81 85   Resp: (!) 22 (!) 25 (!) 24   Temp:  98.1 F (36.7 C)    TempSrc:  Oral    SpO2: 98% 100% 93% 90%  Weight:      Height:        Intake/Output Summary (Last 24 hours) at 07/20/2019 1104 Last data filed at 07/20/2019 0900 Gross per 24 hour  Intake 240 ml  Output --  Net 240 ml   Filed Weights   07/14/19 0500 07/15/19 0600 07/19/19 0500  Weight: 135.3 kg 134.2 kg 134.8 kg    Examination:  General exam: Appears calm and comfortable, obese Respiratory system: Clear to auscultation. Respiratory effort normal. Cardiovascular system: S1 & S2 heard, RRR. No JVD, murmurs, rubs, gallops or clicks. No pedal edema. Gastrointestinal system: Abdomen is nondistended, soft and nontender. No organomegaly or masses felt. Normal bowel sounds heard. Central nervous system: Alert and oriented. No focal neurological deficits. Extremities: Symmetric 5 x 5 power. Skin: No rashes, lesions or ulcers Psychiatry: Judgement and insight appear normal. Mood & affect appropriate.     Data Reviewed: I have personally reviewed following labs and imaging studies  CBC: Recent Labs  Lab 07/14/19 0902  WBC 9.2  HGB 11.2*  HCT 39.4  MCV 96.8  PLT 160   Basic Metabolic Panel: Recent Labs  Lab 07/14/19 0902  NA 142  K 4.4  CL 98  CO2 36*  GLUCOSE 117*  BUN 14  CREATININE 0.68  CALCIUM 8.3*   GFR: Estimated Creatinine Clearance: 96.4 mL/min (by C-G formula based on SCr of 0.68 mg/dL). Liver Function Tests: No  results for input(s): AST, ALT, ALKPHOS, BILITOT, PROT, ALBUMIN in the last 168 hours. No results for input(s): LIPASE, AMYLASE in the last 168 hours. No results for input(s): AMMONIA in the last 168 hours. Coagulation Profile: No results for input(s): INR, PROTIME in the last 168 hours. Cardiac Enzymes: No results for input(s): CKTOTAL, CKMB, CKMBINDEX, TROPONINI in the last 168 hours. BNP (last 3 results) No results for input(s): PROBNP in the last 8760 hours. HbA1C: No results for input(s): HGBA1C in the last 72 hours. CBG: No results for input(s): GLUCAP in the last 168 hours. Lipid Profile: No results for input(s): CHOL, HDL, LDLCALC, TRIG, CHOLHDL, LDLDIRECT in the last 72 hours. Thyroid Function Tests: No results for input(s): TSH, T4TOTAL, FREET4, T3FREE, THYROIDAB in the last 72 hours. Anemia Panel: No results for input(s): VITAMINB12, FOLATE, FERRITIN, TIBC, IRON, RETICCTPCT in the last 72 hours. Sepsis Labs: No results for input(s): PROCALCITON, LATICACIDVEN in the last 168 hours.  Recent Results (from the past 240 hour(s))  SARS Coronavirus 2 by RT PCR (hospital order, performed in [facility] hospital lab) Nasopharyngeal Nasopharyngeal Swab     Status: None   Collection Time: 07/12/19 11:45 PM   Specimen: Nasopharyngeal Swab  Result Value Ref Range Status   SARS Coronavirus 2 NEGATIVE NEGATIVE Final    Comment: (NOTE) SARS-CoV-2 target nucleic acids are NOT DETECTED. The SARS-CoV-2 RNA is generally detectable in upper and lower respiratory specimens during the acute phase of infection. The lowest concentration of SARS-CoV-2 viral copies this assay can detect is 250 copies / mL. A negative result does not preclude SARS-CoV-2 infection and should not  be used as the sole basis for treatment or other patient management decisions.  A negative result may occur with improper specimen collection / handling, submission of specimen other than nasopharyngeal swab, presence of  viral mutation(s) within the areas targeted by this assay, and inadequate number of viral copies (<250 copies / mL). A negative result must be combined with clinical observations, patient history, and epidemiological information. Fact Sheet for Patients:   StrictlyIdeas.no Fact Sheet for Healthcare Providers: BankingDealers.co.za This test is not yet approved or cleared  by the [place] FDA and has been authorized for detection and/or diagnosis of SARS-CoV-2 by FDA under an Emergency Use Authorization (EUA).  This EUA will remain in effect (meaning this test can be used) for the duration of the COVID-19 declaration under Section 564(b)(1) of the Act, 21 U.S.C. section 360bbb-3(b)(1), unless the authorization is terminated or revoked sooner. Performed at [facility], [address] [place], [place] [address]   MRSA PCR Screening     Status: None   Collection Time: 07/13/19  4:55 AM   Specimen: Nasal Mucosa; Nasopharyngeal  Result Value Ref Range Status   MRSA by PCR NEGATIVE NEGATIVE Final    Comment:        The GeneXpert MRSA Assay (FDA approved for NASAL specimens only), is one component of a comprehensive MRSA colonization surveillance program. It is not intended to diagnose MRSA infection nor to guide or monitor treatment for MRSA infections. Performed at [facility], [address] [place], [place] [address]          Radiology Studies: DG Chest 2 View  Result Date: 07/19/2019 CLINICAL DATA:  Shortness of breath EXAM: CHEST - 2 VIEW COMPARISON:  Jul 12, 2019 chest radiograph and chest CT Jul 09, 2019 FINDINGS: There is atelectatic change in the left lower lung region. The lungs elsewhere are clear. Heart is upper normal in size with pulmonary vascularity normal. No evident adenopathy. There is degenerative change in the thoracic spine. IMPRESSION: Left lower lung region atelectatic change. No edema or airspace opacity.  Heart size within normal limits. No adenopathy demonstrable. Electronically Signed   By: Lowella Grip III M.D.   On: 07/19/2019 10:47        Scheduled Meds: . amLODipine  10 mg Oral Daily  . apixaban  5 mg Oral BID  . chlorhexidine  15 mL Mouth Rinse BID  . Chlorhexidine Gluconate Cloth  6 each Topical Daily  . ipratropium  0.5 mg Nebulization TID  . levalbuterol  0.63 mg Nebulization TID  . mouth rinse  15 mL Mouth Rinse q12n4p  . pantoprazole  40 mg Oral BID AC  . sodium chloride flush  3 mL Intravenous Q12H   Continuous Infusions: . sodium chloride       LOS: 7 days    Time spent: 30 minutes    Burnette Sautter Darleen Crocker, DO [facility]  If 7PM-7AM, please contact night-coverage [email or web address] 07/20/2019, 11:04 AM

## 2019-07-20 NOTE — Progress Notes (Signed)
Patient off Avaps on room air.

## 2019-07-20 NOTE — Progress Notes (Signed)
At 2345 patient was placed on AVAPS mode to obtain parameters noted by Dr. Melvyn Novas.RT spoke with sleep tech about the settings and reviewed what was best for patient. RT set BIPAP on a VT of 350 with a RR 16 FIO2 21%.  Patient fell asleep and around 0020 RT noted patient saturations hanging around 88% on settings and increased FIO2 to 28%. During this time patient was resting comfortably with no distress or complications. Patient maintained saturations in the lower 90s.  Around patient was transferred 300 was taken off BIPAP to transfer upstairs. Once settled in room placed back on BIPAP and patient went back to sleep.

## 2019-07-21 NOTE — Plan of Care (Signed)
  Problem: Education: Goal: Knowledge of General Education information will improve Description Including pain rating scale, medication(s)/side effects and non-pharmacologic comfort measures Outcome: Progressing   Problem: Health Behavior/Discharge Planning: Goal: Ability to manage health-related needs will improve Outcome: Progressing   Problem: Clinical Measurements: Goal: Respiratory complications will improve Outcome: Progressing Goal: Cardiovascular complication will be avoided Outcome: Progressing   Problem: Safety: Goal: Ability to remain free from injury will improve Outcome: Progressing   

## 2019-07-21 NOTE — Progress Notes (Signed)
PROGRESS NOTE    Carolyn Silva  WUJ:[id number] DOB: Mar 25, 1950 DOA: 07/12/2019 PCP: Celene Squibb, MD   Brief Narrative:  As per H&P written by Dr. Darrick Meigs on 07/13/19 69 y.o.female,with history of asthma, hypertension, newly diagnosed atrial fibrillation on anticoagulation with Eliquis was brought to the ED with complaints of tachycardia and respiratory difficulty. Patient was discharged from hospital on May 27 after she was diagnosed with new onset A. fib and underwent successful cardioversion. Patient was discharged on apixaban. Patient's daughter is a respiratory therapist and went on to check her mother today. Patient seem to have respiratory difficulty. She placed her on oxygen saturation monitor and noted the patient's was tachycardic with O2 sats decreased. In the ED patient was found to be in A. fib with RVR with heart rate 140s. Patient underwent electrical cardioversion in the ED and tolerated procedure well. However she was noted to have end-tidal CO2 in the 70s. Patient underwent propofol sedation. ABG showed PCO2 of 97. Patient was alert and oriented x3. She was placed on BiPAP for acute respiratory acidosis. At this time patient is on BiPAP, denies any chest pain. Denies nausea or vomiting. No history of abdominal pain or dysuria.  -Plan is to have patient remain on BiPAP overnight and use oxygen as needed with further evaluation pending by pulmonology by Monday.  Assessment & Plan:   Active Problems:   Acute respiratory failure with hypercapnia (HCC)  1-Acute respiratory failure with hypercapnia (HCC) -In the setting ofobesity related thoracic restrictionand obstructive sleep apnea -Patient willneedsleep study as an outpatient, and prior to that continue use ofNIV -Excellent response to the use ofNIVhaving CO2 down into 58range. -Patient is alert, awake, oriented x3 and very interactive;. -Patient's niece expressed desaturation episodes while  ambulating as an outpatient.Home O2 screening demonstrated the need for 2L [place] with exertion. -continue QHSNIV -Patient will require further evaluation by pulmonology/sleep medicine by 6/7 for hopeful NIV at home so she can discharge  2-a. Fib with RVR-resolved -Post cardioversion in the EDon 07/13/19 -Heart rate stableand sinus rhythmcurrently. -Continue Eliquis for secondary prevention -Has outpatient follow-up scheduled with cardiology service. -will make sure at discharge she follow up with monitoring event as previously prescribed.  3-morbid obesity: With undiagnosed obstructive sleep apnea and obesity hypoventilation syndrome. -Body mass index is 47.75 kg/m. -Low calorie diet, portion control increase physical activity discussed with patient. -Will need sleep study as an outpatient -Assess oxygen supplementation needs at rest and on exertion.  4-history of asthma -Stable medical condition without any wheezing or shortness of breath. -Continue monitoring -Continue home bronchodilators regimen. -not need for steroids therapy currently. -Keep off ACE inhibitor's  5-leukocytosis -Received steroids -Within normal limits currently -no signs of acute infections -No need for further antibiotics at this time.  6-GERD -continue PPI   DVT prophylaxis:eliquis. Code Status:Full Family Communication:None at bedside Disposition:  Status is: Inpatient  Dispo: The patient is from: home  Anticipated d/c is NF:AOZH  Anticipated d/c date is:1day Patient currentlynot medically for discharge;patientwill remain onBiPAP with overnight oximetry study. Pulmonology working on noninvasive ventilation options. Appreciateongoingpulmonology recommendations. Unsafe for discharge at this time and will require further pulmonology/sleep medicine evaluation by 6/7 to arrange for home machine prior to  discharge.    Consultants:  Pulmonology  Procedures: See below for x-ray reports.   Antimicrobials:  None  Subjective: Patient seen and evaluated today with no new acute complaints or concerns. No acute concerns or events noted overnight.  She continues to sleep well overnight  on her BiPAP.  Objective: Vitals:   07/20/19 2316 07/21/19 0439 07/21/19 0747 07/21/19 1123  BP:  127/72  120/72  Pulse: 83 73  76  Resp: 20 (!) 25  20  Temp:  97.6 F (36.4 C)    TempSrc:  Oral    SpO2: 98% 98% 91% 94%  Weight:      Height:        Intake/Output Summary (Last 24 hours) at 07/21/2019 1125 Last data filed at 07/20/2019 2123 Gross per 24 hour  Intake 253 ml  Output --  Net 253 ml   Filed Weights   07/14/19 0500 07/15/19 0600 07/19/19 0500  Weight: 135.3 kg 134.2 kg 134.8 kg    Examination:  General exam: Appears calm and comfortable, obese Respiratory system: Clear to auscultation. Respiratory effort normal. Cardiovascular system: S1 & S2 heard, RRR. No JVD, murmurs, rubs, gallops or clicks. No pedal edema. Gastrointestinal system: Abdomen is nondistended, soft and nontender. No organomegaly or masses felt. Normal bowel sounds heard. Central nervous system: Alert and oriented. No focal neurological deficits. Extremities: Symmetric 5 x 5 power. Skin: No rashes, lesions or ulcers Psychiatry: Judgement and insight appear normal. Mood & affect appropriate.     Data Reviewed: I have personally reviewed following labs and imaging studies  CBC: No results for input(s): WBC, NEUTROABS, HGB, HCT, MCV, PLT in the last 168 hours. Basic Metabolic Panel: No results for input(s): NA, K, CL, CO2, GLUCOSE, BUN, CREATININE, CALCIUM, MG, PHOS in the last 168 hours. GFR: Estimated Creatinine Clearance: 96.4 mL/min (by C-G formula based on SCr of 0.68 mg/dL). Liver Function Tests: No results for input(s): AST, ALT, ALKPHOS, BILITOT, PROT, ALBUMIN in the last 168 hours. No results  for input(s): LIPASE, AMYLASE in the last 168 hours. No results for input(s): AMMONIA in the last 168 hours. Coagulation Profile: No results for input(s): INR, PROTIME in the last 168 hours. Cardiac Enzymes: No results for input(s): CKTOTAL, CKMB, CKMBINDEX, TROPONINI in the last 168 hours. BNP (last 3 results) No results for input(s): PROBNP in the last 8760 hours. HbA1C: No results for input(s): HGBA1C in the last 72 hours. CBG: No results for input(s): GLUCAP in the last 168 hours. Lipid Profile: No results for input(s): CHOL, HDL, LDLCALC, TRIG, CHOLHDL, LDLDIRECT in the last 72 hours. Thyroid Function Tests: No results for input(s): TSH, T4TOTAL, FREET4, T3FREE, THYROIDAB in the last 72 hours. Anemia Panel: No results for input(s): VITAMINB12, FOLATE, FERRITIN, TIBC, IRON, RETICCTPCT in the last 72 hours. Sepsis Labs: No results for input(s): PROCALCITON, LATICACIDVEN in the last 168 hours.  Recent Results (from the past 240 hour(s))  SARS Coronavirus 2 by RT PCR (hospital order, performed in [facility] hospital lab) Nasopharyngeal Nasopharyngeal Swab     Status: None   Collection Time: 07/12/19 11:45 PM   Specimen: Nasopharyngeal Swab  Result Value Ref Range Status   SARS Coronavirus 2 NEGATIVE NEGATIVE Final    Comment: (NOTE) SARS-CoV-2 target nucleic acids are NOT DETECTED. The SARS-CoV-2 RNA is generally detectable in upper and lower respiratory specimens during the acute phase of infection. The lowest concentration of SARS-CoV-2 viral copies this assay can detect is 250 copies / mL. A negative result does not preclude SARS-CoV-2 infection and should not be used as the sole basis for treatment or other patient management decisions.  A negative result may occur with improper specimen collection / handling, submission of specimen other than nasopharyngeal swab, presence of viral mutation(s) within the areas targeted by this  assay, and inadequate number of viral  copies (<250 copies / mL). A negative result must be combined with clinical observations, patient history, and epidemiological information. Fact Sheet for Patients:   StrictlyIdeas.no Fact Sheet for Healthcare Providers: BankingDealers.co.za This test is not yet approved or cleared  by the [place] FDA and has been authorized for detection and/or diagnosis of SARS-CoV-2 by FDA under an Emergency Use Authorization (EUA).  This EUA will remain in effect (meaning this test can be used) for the duration of the COVID-19 declaration under Section 564(b)(1) of the Act, 21 U.S.C. section 360bbb-3(b)(1), unless the authorization is terminated or revoked sooner. Performed at [facility], [address] [place], [place] [address]   MRSA PCR Screening     Status: None   Collection Time: 07/13/19  4:55 AM   Specimen: Nasal Mucosa; Nasopharyngeal  Result Value Ref Range Status   MRSA by PCR NEGATIVE NEGATIVE Final    Comment:        The GeneXpert MRSA Assay (FDA approved for NASAL specimens only), is one component of a comprehensive MRSA colonization surveillance program. It is not intended to diagnose MRSA infection nor to guide or monitor treatment for MRSA infections. Performed at [facility], [address] [place], [place] [address]          Radiology Studies: No results found.      Scheduled Meds: . amLODipine  10 mg Oral Daily  . apixaban  5 mg Oral BID  . chlorhexidine  15 mL Mouth Rinse BID  . Chlorhexidine Gluconate Cloth  6 each Topical Daily  . ipratropium  0.5 mg Nebulization TID  . levalbuterol  0.63 mg Nebulization TID  . mouth rinse  15 mL Mouth Rinse q12n4p  . pantoprazole  40 mg Oral BID AC  . sodium chloride flush  3 mL Intravenous Q12H   Continuous Infusions: . sodium chloride       LOS: 8 days    Time spent: 30 minutes    Suzi Hernan Darleen Crocker, DO [facility]  If 7PM-7AM, please contact  night-coverage [email or web address] 07/21/2019, 11:25 AM

## 2019-07-22 DIAGNOSIS — J9612 Chronic respiratory failure with hypercapnia: Secondary | ICD-10-CM

## 2019-07-22 DIAGNOSIS — J9611 Chronic respiratory failure with hypoxia: Secondary | ICD-10-CM

## 2019-07-22 NOTE — Progress Notes (Signed)
NAME:  Carolyn Silva, MRN:  [id number], DOB:  [birth year], LOS: 9 ADMISSION DATE:  07/12/2019, CONSULTATION DATE:  07/18/19 REFERRING MD:  Shah/ triad, CHIEF COMPLAINT:  Sob    Brief History   69 yo female presented with dyspnea in setting of a fib with RVR.  Found to have chronic hypoxia and hypercapnia requiring Bipap.  Past Medical History:  Hypertension, Asthma  Significant Hospital Events   5/28 Admit, cardioversion in ER  Consults:    Procedures:    Significant Diagnostic Tests:   CT angio chest 07/09/19 >> thoracic spondylosis  TEE 07/11/19 >> EF 65 to 70%, mild LVH, small ASD with Lt to Rt shunt  Micro Data:  SARS CoV2 PCR 5/28 >> negative  Antimicrobials:     Interim history/subjective:  Breathing better.  Not having cough, wheeze, or sputum.  Bipap at night helps.  Objective   Blood pressure 111/64, pulse 78, temperature 97.9 F (36.6 C), temperature source Axillary, resp. rate 18, height 5\' 6"  (1.676 m), weight 134.8 kg, SpO2 97 %.        Intake/Output Summary (Last 24 hours) at 07/22/2019 1316 Last data filed at 07/21/2019 1700 Gross per 24 hour  Intake 240 ml  Output --  Net 240 ml   Filed Weights   07/14/19 0500 07/15/19 0600 07/19/19 0500  Weight: 135.3 kg 134.2 kg 134.8 kg    Examination:  General - alert Eyes - pupils reactive ENT - no sinus tenderness, no stridor Cardiac - regular rate/rhythm, no murmur Chest - equal breath sounds b/l, no wheezing or rales Abdomen - soft, non tender, + bowel sounds Extremities - no cyanosis, clubbing, or edema Skin - no rashes Neuro - normal strength, moves extremities, follows commands Psych - normal mood and behavior   Discussion:  Carolyn Silva continues to exhibit signs of hypercapnia associated with morbid obesity that is causing thoracic restriction.  Interruption or failure to provide NIV would quickly lead to exacerbation of the patients condition, hospital admission, and likely harm to the  patient. Continued use is preferred.  The use of the NIV will treat patients high PC02 levels and can reduce risk of exacerbations and future hospitalizations when used at night and during the day.  BiLevel/RAD has been tried previously and has proven ineffective at managing this patients hypercapnia.  Ventilation is required to decrease the work of breathing and improve pulmonary status. Interruption of ventilator support would lead to decline of health status.  Patient is able to protect their airways and clear secretions on their own.  Assessment & Plan:   Chronic hypoxic/hypercapnic respiratory failure. - most likely from sleep disordered breathing in setting of morbid obesity causing thoracic restriction - will need to arrange for trilogy home vent - will then plan to arrange for sleep testing as an outpt to further refine her therapy  Asthma. - prn albuterol - will need to arrange for PFT as outpt   Labs:   CMP Latest Ref Rng & Units 07/14/2019 07/13/2019 07/12/2019  Glucose 70 - 99 mg/dL 117(H) 120(H) 116(H)  BUN 8 - 23 mg/dL 14 28(H) 29(H)  Creatinine 0.44 - 1.00 mg/dL 0.68 0.86 0.96  Sodium 135 - 145 mmol/L 142 141 140  Potassium 3.5 - 5.1 mmol/L 4.4 4.7 4.2  Chloride 98 - 111 mmol/L 98 97(L) 97(L)  CO2 22 - 32 mmol/L 36(H) 35(H) 34(H)  Calcium 8.9 - 10.3 mg/dL 8.3(L) 8.3(L) 8.4(L)  Total Protein 6.5 - 8.1 g/dL - 6.7 -  Total Bilirubin 0.3 - 1.2 mg/dL - 0.5 -  Alkaline Phos 38 - 126 U/L - 77 -  AST 15 - 41 U/L - 29 -  ALT 0 - 44 U/L - 40 -    CBC Latest Ref Rng & Units 07/14/2019 07/13/2019 07/12/2019  WBC 4.0 - 10.5 K/uL 9.2 15.4(H) 16.5(H)  Hemoglobin 12.0 - 15.0 g/dL 11.2(L) 11.6(L) 11.8(L)  Hematocrit 36.0 - 46.0 % 39.4 42.4 41.4  Platelets 150 - 400 K/uL 204 260 262    ABG    Component Value Date/Time   PHART 7.421 07/16/2019 0920   PCO2ART 58.8 (H) 07/16/2019 0920   PO2ART 57.7 (L) 07/16/2019 0920   HCO3 35.1 (H) 07/16/2019 0920   O2SAT 89.2 07/16/2019 0920     Signature:  Chesley Mires, MD [place] Pager - [phone] 07/22/2019, 2:14 PM

## 2019-07-22 NOTE — TOC Progression Note (Signed)
Transition of Care [facility]) - Progression Note    Patient Details  Name: Carolyn Silva MRN: [id number] Date of Birth: [birth year]  [facility] CM/SW Contact  Shade Flood, LCSW Phone Number: 07/22/2019, 3:26 PM  Clinical Narrative:     TOC following. Dr. Halford Chessman was here to see pt. He has documented need for NIV and Juliann Pulse at [employer] working on arranging it. Insurance prior Josem Kaufmann is needed so it will likely be tomorrow before they get that. Updated MD who states that pt is medically ready for dc once the NIV is available. [facility] is willing to take pt for [facility] RN if needed.   Assigned TOC will follow.  Expected Discharge Plan: [place] Barriers to Discharge: Continued Medical Work up  Expected Discharge Plan and Services Expected Discharge Plan: [place] arrangements for the past 2 months: Single Family Home                 DME Arranged: NIV DME Agency: [employer] Date DME Agency Contacted: 07/16/19 Time DME Agency Contacted: [phone]               Social Determinants of Health (SDOH) Interventions    Readmission Risk Interventions No flowsheet data found.

## 2019-07-22 NOTE — Progress Notes (Signed)
PROGRESS NOTE    Carolyn Silva  VQQ:[id number] DOB: 08/05/1950 DOA: 07/12/2019 PCP: Celene Squibb, MD   Brief Narrative:  As per H&P written by Dr. Darrick Meigs on 07/13/19 69 y.o.female,with history of asthma, hypertension, newly diagnosed atrial fibrillation on anticoagulation with Eliquis was brought to the ED with complaints of tachycardia and respiratory difficulty. Patient was discharged from hospital on May 27 after she was diagnosed with new onset A. fib and underwent successful cardioversion. Patient was discharged on apixaban. Patient's daughter is a respiratory therapist and went on to check her mother today. Patient seem to have respiratory difficulty. She placed her on oxygen saturation monitor and noted the patient's was tachycardic with O2 sats decreased. In the ED patient was found to be in A. fib with RVR with heart rate 140s. Patient underwent electrical cardioversion in the ED and tolerated procedure well. However she was noted to have end-tidal CO2 in the 70s. Patient underwent propofol sedation. ABG showed PCO2 of 97. Patient was alert and oriented x3. She was placed on BiPAP for acute respiratory acidosis. At this time patient is on BiPAP, denies any chest pain. Denies nausea or vomiting. No history of abdominal pain or dysuria.  -Plan is to have patient remain on BiPAP overnight and use oxygen as needed with further evaluation pending by pulmonology by Monday.   Assessment & Plan:   Active Problems:   Acute respiratory failure with hypercapnia (HCC)   1-Acute respiratory failure with hypercapnia (HCC) -In the setting ofobesity related thoracic restrictionand obstructive sleep apnea -Patient willneedsleep study as an outpatient, and prior to that continue use ofNIV -Excellent response to the use ofNIVhaving CO2 down into 58range. -Patient is alert, awake, oriented x3 and very interactive;. -Patient's niece expressed desaturation episodes while  ambulating as an outpatient.Home O2 screening demonstrated the need for 2L [place] with exertion. -continue QHSNIV -Patient will require further evaluation by pulmonology/sleep medicine by 6/7 for hopeful NIV at home so she can discharge  2-a. Fib with RVR-resolved -Post cardioversion in the [facility] 07/13/19 -Heart rate stableand sinus rhythmcurrently. -Continue Eliquis for secondary prevention -Has outpatient follow-up scheduled with cardiology service. -will make sure at discharge she follow up with monitoring event as previously prescribed.  3-morbid obesity: With undiagnosed obstructive sleep apnea and obesity hypoventilation syndrome. -Body mass index is 47.75 kg/m. -Low calorie diet, portion control increase physical activity discussed with patient. -Will need sleep study as an outpatient -Assess oxygen supplementation needs at rest and on exertion.  4-history of asthma -Stable medical condition without any wheezing or shortness of breath. -Continue monitoring -Continue home bronchodilators regimen. -not need for steroids therapy currently. -Keep off ACE inhibitor's  5-leukocytosis -Received steroids -Within normal limits currently -no signs of acute infections -No need for further antibiotics at this time.  6-GERD -continue PPI   DVT prophylaxis:eliquis. Code Status:Full Family Communication:None at bedside Disposition:  Status is: Inpatient  Dispo: The patient is from: home  Anticipated d/c is EP:PIRJ  Anticipated d/c date is:1day Patient currentlynot medically for discharge;patientwill remain onBiPAP with overnight oximetry study. Pulmonology working on noninvasive ventilation options. Appreciateongoingpulmonology recommendations. Unsafe for discharge at this timeand will require further pulmonology/sleep medicine evaluation by 6/7 to arrange for home machine prior to  discharge.    Consultants:  Pulmonology  Procedures: See below for x-ray reports.   Antimicrobials:  None  Subjective: Patient seen and evaluated today with no new acute complaints or concerns. No acute concerns or events noted overnight.  She has used BiPAP overnight  with no issues.  Objective: Vitals:   07/21/19 2349 07/22/19 0500 07/22/19 0753 07/22/19 1506  BP:  111/64    Pulse: 87 78    Resp: 20 18    Temp:  97.9 F (36.6 C)    TempSrc:  Axillary    SpO2: 96% 99% 97% 96%  Weight:      Height:        Intake/Output Summary (Last 24 hours) at 07/22/2019 1535 Last data filed at 07/21/2019 1700 Gross per 24 hour  Intake 240 ml  Output --  Net 240 ml   Filed Weights   07/14/19 0500 07/15/19 0600 07/19/19 0500  Weight: 135.3 kg 134.2 kg 134.8 kg    Examination:  General exam: Appears calm and comfortable, obese Respiratory system: Clear to auscultation. Respiratory effort normal. Cardiovascular system: S1 & S2 heard, RRR. No JVD, murmurs, rubs, gallops or clicks. No pedal edema. Gastrointestinal system: Abdomen is nondistended, soft and nontender. No organomegaly or masses felt. Normal bowel sounds heard. Central nervous system: Alert and oriented. No focal neurological deficits. Extremities: Symmetric 5 x 5 power. Skin: No rashes, lesions or ulcers Psychiatry: Judgement and insight appear normal. Mood & affect appropriate.     Data Reviewed: I have personally reviewed following labs and imaging studies  CBC: No results for input(s): WBC, NEUTROABS, HGB, HCT, MCV, PLT in the last 168 hours. Basic Metabolic Panel: No results for input(s): NA, K, CL, CO2, GLUCOSE, BUN, CREATININE, CALCIUM, MG, PHOS in the last 168 hours. GFR: Estimated Creatinine Clearance: 96.4 mL/min (by C-G formula based on SCr of 0.68 mg/dL). Liver Function Tests: No results for input(s): AST, ALT, ALKPHOS, BILITOT, PROT, ALBUMIN in the last 168 hours. No results for input(s):  LIPASE, AMYLASE in the last 168 hours. No results for input(s): AMMONIA in the last 168 hours. Coagulation Profile: No results for input(s): INR, PROTIME in the last 168 hours. Cardiac Enzymes: No results for input(s): CKTOTAL, CKMB, CKMBINDEX, TROPONINI in the last 168 hours. BNP (last 3 results) No results for input(s): PROBNP in the last 8760 hours. HbA1C: No results for input(s): HGBA1C in the last 72 hours. CBG: No results for input(s): GLUCAP in the last 168 hours. Lipid Profile: No results for input(s): CHOL, HDL, LDLCALC, TRIG, CHOLHDL, LDLDIRECT in the last 72 hours. Thyroid Function Tests: No results for input(s): TSH, T4TOTAL, FREET4, T3FREE, THYROIDAB in the last 72 hours. Anemia Panel: No results for input(s): VITAMINB12, FOLATE, FERRITIN, TIBC, IRON, RETICCTPCT in the last 72 hours. Sepsis Labs: No results for input(s): PROCALCITON, LATICACIDVEN in the last 168 hours.  Recent Results (from the past 240 hour(s))  SARS Coronavirus 2 by RT PCR (hospital order, performed in [facility] hospital lab) Nasopharyngeal Nasopharyngeal Swab     Status: None   Collection Time: 07/12/19 11:45 PM   Specimen: Nasopharyngeal Swab  Result Value Ref Range Status   SARS Coronavirus 2 NEGATIVE NEGATIVE Final    Comment: (NOTE) SARS-CoV-2 target nucleic acids are NOT DETECTED. The SARS-CoV-2 RNA is generally detectable in upper and lower respiratory specimens during the acute phase of infection. The lowest concentration of SARS-CoV-2 viral copies this assay can detect is 250 copies / mL. A negative result does not preclude SARS-CoV-2 infection and should not be used as the sole basis for treatment or other patient management decisions.  A negative result may occur with improper specimen collection / handling, submission of specimen other than nasopharyngeal swab, presence of viral mutation(s) within the areas targeted by this assay,  and inadequate number of viral copies (<250 copies /  mL). A negative result must be combined with clinical observations, patient history, and epidemiological information. Fact Sheet for Patients:   StrictlyIdeas.no Fact Sheet for Healthcare Providers: BankingDealers.co.za This test is not yet approved or cleared  by the [place] FDA and has been authorized for detection and/or diagnosis of SARS-CoV-2 by FDA under an Emergency Use Authorization (EUA).  This EUA will remain in effect (meaning this test can be used) for the duration of the COVID-19 declaration under Section 564(b)(1) of the Act, 21 U.S.C. section 360bbb-3(b)(1), unless the authorization is terminated or revoked sooner. Performed at [facility], [address] [place], [place] [address]   MRSA PCR Screening     Status: None   Collection Time: 07/13/19  4:55 AM   Specimen: Nasal Mucosa; Nasopharyngeal  Result Value Ref Range Status   MRSA by PCR NEGATIVE NEGATIVE Final    Comment:        The GeneXpert MRSA Assay (FDA approved for NASAL specimens only), is one component of a comprehensive MRSA colonization surveillance program. It is not intended to diagnose MRSA infection nor to guide or monitor treatment for MRSA infections. Performed at [facility], [address], [place],  [address]          Radiology Studies: No results found.      Scheduled Meds: . amLODipine  10 mg Oral Daily  . apixaban  5 mg Oral BID  . chlorhexidine  15 mL Mouth Rinse BID  . Chlorhexidine Gluconate Cloth  6 each Topical Daily  . ipratropium  0.5 mg Nebulization TID  . levalbuterol  0.63 mg Nebulization TID  . mouth rinse  15 mL Mouth Rinse q12n4p  . pantoprazole  40 mg Oral BID AC  . sodium chloride flush  3 mL Intravenous Q12H   Continuous Infusions: . sodium chloride       LOS: 9 days    Time spent: 30 minutes    Tyffany Waldrop Darleen Crocker, DO [facility]  If 7PM-7AM, please contact  night-coverage [email or web address] 07/22/2019, 3:35 PM

## 2019-07-22 NOTE — Care Management Important Message (Signed)
Important Message  Patient Details  Name: Carolyn Silva MRN: [id number] Date of Birth: [birth year]   Medicare Important Message Given:  Yes     Tommy Medal 07/22/2019, 2:48 PM

## 2019-07-23 ENCOUNTER — Telehealth: Payer: Self-pay | Admitting: Pulmonary Disease

## 2019-07-23 MED ORDER — LISINOPRIL 10 MG PO TABS: 10.0000 mg | ORAL_TABLET | Freq: Every day | ORAL | 3 refills | Status: DC

## 2019-07-23 MED ORDER — IPRATROPIUM BROMIDE 0.02 % IN SOLN: 0.5000 mg | Freq: Two times a day (BID) | RESPIRATORY_TRACT | Status: DC

## 2019-07-23 MED ORDER — LISINOPRIL 10 MG PO TABS
10.0000 mg | ORAL_TABLET | Freq: Every day | ORAL | Status: DC
  Administered 2019-07-23: 10 mg via ORAL
  Filled 2019-07-23: qty 1

## 2019-07-23 MED ORDER — LEVALBUTEROL HCL 0.63 MG/3ML IN NEBU: 0.6300 mg | INHALATION_SOLUTION | Freq: Two times a day (BID) | RESPIRATORY_TRACT | Status: DC

## 2019-07-23 NOTE — Progress Notes (Signed)
Spoke with pt.  She is getting trilogy set up at home, and then d/c once this done.  Have informed her that office staff will schedule tele visit with nurse practitioner in couple weeks to arrange for sleep testing.  She will then have f/u in [place] after that.  Chesley Mires, MD [place] Pager - [phone] 07/23/2019, 2:44 PM

## 2019-07-23 NOTE — Telephone Encounter (Signed)
Patient has been scheduled for a televisit on 6/21 at 330pm with Aaron Edelman.

## 2019-07-23 NOTE — TOC Transition Note (Signed)
Transition of Care [facility]) - CM/SW Discharge Note   Patient Details  Name: Carolyn Silva MRN: [id number] Date of Birth: Jun 19, 1950  Transition of Care [facility]) CM/SW Contact:  Ihor Gully, LCSW Phone Number: 07/23/2019, 4:17 PM   Clinical Narrative:    Patient's NIV will be delivered to the home today via RT with [employer]. [employer] auth remains pending.    Final next level of care: [place] Barriers to Discharge: Continued Medical Work up   Patient Goals and CMS Choice Patient states their goals for this hospitalization and ongoing recovery are:: return home      Discharge Placement                       Discharge Plan and Services                DME Arranged: NIV DME Agency: [employer] Date DME Agency Contacted: 07/16/19 Time DME Agency Contacted: [phone]              Social Determinants of Health (SDOH) Interventions     Readmission Risk Interventions No flowsheet data found.

## 2019-07-23 NOTE — Telephone Encounter (Signed)
She is being discharged from [facility] on 07/23/19.  Please schedule hospital follow up visit with one of the NPs for two weeks.  Can be tele visit.  She will then need follow up in [place] after that.

## 2019-07-23 NOTE — Discharge Summary (Signed)
Physician Discharge Summary  Carolyn Silva:[id number] DOB: 1950/05/24 DOA: 07/12/2019  PCP: Celene Squibb, MD  Admit date: 07/12/2019  Discharge date: 07/23/2019  Admitted From:Home  Disposition:  Home  Recommendations for Outpatient Follow-up:  1. Follow up with PCP in 1-2 weeks, follow-up blood pressure on lisinopril 2. Follow-up with [facility] pulmonology group in 1-2 weeks regarding outpatient sleep study evaluation 3. Continue to use home trilogy machine every night as prescribed 4. Follow-up with cardiology as scheduled.  Home Health: None  Equipment/Devices: Trilogy machine  Discharge Condition: Stable  CODE STATUS: Full  Diet recommendation: Heart Healthy  Brief/Interim Summary: As per H&P written by Dr. Darrick Meigs on 07/13/19 69 y.o.female,with history of asthma, hypertension, newly diagnosed atrial fibrillation on anticoagulation with Eliquis was brought to the ED with complaints of tachycardia and respiratory difficulty. Patient was discharged from hospital on May 27 after she was diagnosed with new onset A. fib and underwent successful cardioversion. Patient was discharged on apixaban. Patient's daughter is a respiratory therapist and went on to check her mother today. Patient seem to have respiratory difficulty. She placed her on oxygen saturation monitor and noted the patient's was tachycardic with O2 sats decreased. In the ED patient was found to be in A. fib with RVR with heart rate 140s. Patient underwent electrical cardioversion in the ED and tolerated procedure well. However she was noted to have end-tidal CO2 in the 70s. Patient underwent propofol sedation. ABG showed PCO2 of 97. Patient was alert and oriented x3. She was placed on BiPAP for acute respiratory acidosis. At this time patient is on BiPAP, denies any chest pain. Denies nausea or vomiting. No history of abdominal pain or dysuria.  -Patient is basically noted to have chronic hypoxemic and  hypercapnic respiratory failure in the setting of morbid obesity which is causing her thoracic restriction.  She has been seen by pulmonology and it took quite a while to determine how to best arrange for her to receive noninvasive ventilation at home.  She is now able to receive trilogy home ventilator and will have further sleep studies in the outpatient setting scheduled for her locally with pulmonology.  She has had no other significant health concerns throughout the course of her stay aside from her blood pressure being elevated for which she was placed on amlodipine.  This will be discontinued due to lower extremity edema and she will remain on lisinopril as prescribed which she claims she was on previously.  She will need to have her blood pressures followed up closely with her PCP Dr. Nevada Crane.  Discharge Diagnoses:  Active Problems:   Acute respiratory failure with hypercapnia (HCC)  Principal discharge diagnosis: Chronic hypoxemic/hypercapnic respiratory failure in the setting of morbid obesity causing thoracic restriction.  Atrial fibrillation with RVR status post cardioversion.  Discharge Instructions  Discharge Instructions    Diet - low sodium heart healthy   Complete by: As directed    Increase activity slowly   Complete by: As directed      Allergies as of 07/23/2019      Reactions   Strawberry C [ascorbic Acid] Hives      Medication List    STOP taking these medications   predniSONE 20 MG tablet Commonly known as: DELTASONE     TAKE these medications   albuterol 108 (90 Base) MCG/ACT inhaler Commonly known as: VENTOLIN HFA Inhale 1-2 puffs into the lungs every 6 (six) hours as needed for wheezing or shortness of breath.   Eliquis 5  MG Tabs tablet Generic drug: apixaban Take 5 mg by mouth 2 (two) times daily.   lisinopril 10 MG tablet Commonly known as: ZESTRIL Take 1 tablet (10 mg total) by mouth daily. Start taking on: July 24, 2019            Durable  Medical Equipment  (From admission, onward)         Start     Ordered   07/16/19 1215  For home use only DME oxygen  Once    Question Answer Comment  Length of Need Lifetime   Mode or (Route) Nasal cannula   Liters per Minute 2   Frequency Continuous (stationary and portable oxygen unit needed)   Oxygen delivery system Gas      07/16/19 1215         Follow-up Information    Celene Squibb, MD Follow up in 1 week(s).   Specialty: Internal Medicine Contact information: [place] [address] [phone]        [facility]. Schedule an appointment as soon as possible for a visit in 1 week(s).   Specialty: Pulmonology Why: F/U with Dr. Halford Chessman for sleep study. Contact information: [place] [address] [phone]       Satira Sark, MD Follow up in 2 week(s).   Specialty: Cardiology Contact information: [place] [address] [phone]          Allergies  Allergen Reactions  . Strawberry C [Ascorbic Acid] Hives    Consultations:  Pulmonology   Procedures/Studies: DG Chest 2 View  Result Date: 07/19/2019 CLINICAL DATA:  Shortness of breath EXAM: CHEST - 2 VIEW COMPARISON:  Jul 12, 2019 chest radiograph and chest CT Jul 09, 2019 FINDINGS: There is atelectatic change in the left lower lung region. The lungs elsewhere are clear. Heart is upper normal in size with pulmonary vascularity normal. No evident adenopathy. There is degenerative change in the thoracic spine. IMPRESSION: Left lower lung region atelectatic change. No edema or airspace opacity. Heart size within normal limits. No adenopathy demonstrable. Electronically Signed   By: Lowella Grip III M.D.   On: 07/19/2019 10:47   DG Chest 2 View  Result Date: 07/01/2019 CLINICAL DATA:  Cough EXAM: CHEST - 2 VIEW COMPARISON:  08/16/2013 FINDINGS: No focal airspace disease or effusion. Enlarged cardiomediastinal  silhouette. Enlarged central pulmonary arteries. Aortic atherosclerosis. No pneumothorax. IMPRESSION: No active cardiopulmonary disease. Cardiomegaly. Enlarged central pulmonary arteries suggesting arterial hypertension. Electronically Signed   By: Donavan Foil M.D.   On: 07/01/2019 23:28   CT Angio Chest PE W and/or Wo Contrast  Result Date: 07/09/2019 CLINICAL DATA:  Shortness of breath, atrial fibrillation with rapid ventricular response intermittently for 3 weeks, dizziness EXAM: CT ANGIOGRAPHY CHEST WITH CONTRAST TECHNIQUE: Multidetector CT imaging of the chest was performed using the standard protocol during bolus administration of intravenous contrast. Multiplanar CT image reconstructions and MIPs were obtained to evaluate the vascular anatomy. CONTRAST:  160mL OMNIPAQUE IOHEXOL 350 MG/ML SOLN COMPARISON:  07/09/2019 FINDINGS: Cardiovascular: This is a technically adequate evaluation of the pulmonary vasculature. No filling defects or pulmonary emboli. The heart is mildly enlarged. No pericardial effusion. Thoracic aorta is normal in caliber without evidence of dissection. Mediastinum/Nodes: No enlarged mediastinal, hilar, or axillary lymph nodes. Thyroid gland, trachea, and esophagus demonstrate no significant findings. Lungs/Pleura: Hypoventilatory changes are seen within the left lower lobe. No airspace disease, effusion, or pneumothorax. The central airways are  patent. Upper Abdomen: No acute abnormality. Musculoskeletal: No acute or destructive bony lesions. There is prominent spondylosis at T12/L1 with left paracentral disc osteophyte complex resulting in at least mild central canal stenosis. Diffuse bridging anterior osteophytes throughout the entirety of the visualized thoracic spine. Significant disc space narrowing throughout the mid to upper thoracic spine. Reconstructed images demonstrate no additional findings. Review of the MIP images confirms the above findings. IMPRESSION: 1. No evidence  of pulmonary embolus. 2. Extensive thoracic spondylosis. Electronically Signed   By: Randa Ngo M.D.   On: 07/09/2019 15:24   DG Chest Portable 1 View  Result Date: 07/12/2019 CLINICAL DATA:  69 year old female with heart flutter. EXAM: PORTABLE CHEST 1 VIEW COMPARISON:  Chest CT dated 07/09/2019. FINDINGS: Left lung base density may represent atelectasis. No consolidative changes. There is no large pleural effusion or pneumothorax. There is mild cardiomegaly. There is prominence of the central pulmonary vasculature which may represent a degree of pulmonary hypertension. No acute osseous pathology. IMPRESSION: Mild cardiomegaly with findings suggestive of pulmonary hypertension. No focal consolidation. Electronically Signed   By: Anner Crete M.D.   On: 07/12/2019 21:55   DG Chest Portable 1 View  Result Date: 07/09/2019 CLINICAL DATA:  Shortness of breath for 3 weeks. Atrial fibrillation. EXAM: PORTABLE CHEST 1 VIEW COMPARISON:  07/01/2019 FINDINGS: The cardiac silhouette remains mildly enlarged, and the central pulmonary arteries remain moderately enlarged. There is mild scarring or atelectasis in the left lower lung. No edema, sizable pleural effusion, or pneumothorax is identified. No acute osseous abnormality is seen. IMPRESSION: 1. Mild scarring or atelectasis in the left lower lung. No evidence of pneumonia or edema. 2. Cardiomegaly and pulmonary arterial enlargement. Electronically Signed   By: Logan Bores M.D.   On: 07/09/2019 13:25   ECHOCARDIOGRAM COMPLETE  Result Date: 07/10/2019    ECHOCARDIOGRAM REPORT   Patient Name:   Ethelda Chick Date of Exam: 07/10/2019 Medical Rec #:  [id number]         Height:       66.0 in Accession #:    [id number]        Weight:       289.9 lb Date of Birth:  1951-02-02          BSA:          2.342 m Patient Age:    51 years          BP:           119/87 mmHg Patient Gender: F                 HR:           94 bpm. Exam Location:  [facility] Procedure: 2D  Echo Indications:    Atrial Fibrillation 427.31  History:        Patient has no prior history of Echocardiogram examinations.                 Arrythmias:Atrial Fibrillation; Risk Factors:Hypertension.                 Asthma exacerbation.  Sonographer:    Leavy Cella RDCS (AE) Referring Phys: [place]  1. Left ventricular ejection fraction, by estimation, is 70 to 75%. The left ventricle has hyperdynamic function. The left ventricle has no regional wall motion abnormalities. There is moderate left ventricular hypertrophy. Left ventricular diastolic parameters are indeterminate.  2. Right ventricular systolic function is normal. The right ventricular size is  normal. Tricuspid regurgitation signal is inadequate for assessing PA pressure.  3. Right atrial size was upper normal.  4. The mitral valve is grossly normal. Trivial mitral valve regurgitation.  5. The aortic valve is tricuspid. Aortic valve regurgitation is not visualized.  6. The inferior vena cava is dilated in size with >50% respiratory variability, suggesting right atrial pressure of 8 mmHg. FINDINGS  Left Ventricle: Left ventricular ejection fraction, by estimation, is 70 to 75%. The left ventricle has hyperdynamic function. The left ventricle has no regional wall motion abnormalities. The left ventricular internal cavity size was normal in size. There is moderate left ventricular hypertrophy. Left ventricular diastolic parameters are indeterminate. Right Ventricle: The right ventricular size is normal. No increase in right ventricular wall thickness. Right ventricular systolic function is normal. Tricuspid regurgitation signal is inadequate for assessing PA pressure. Left Atrium: Left atrial size was normal in size. Right Atrium: Right atrial size was upper normal. Pericardium: There is no evidence of pericardial effusion. Mitral Valve: The mitral valve is grossly normal. Trivial mitral valve regurgitation. Tricuspid  Valve: The tricuspid valve is grossly normal. Tricuspid valve regurgitation is trivial. Aortic Valve: The aortic valve is tricuspid. Aortic valve regurgitation is not visualized. Moderate aortic valve annular calcification. Pulmonic Valve: The pulmonic valve was grossly normal. Pulmonic valve regurgitation is trivial. Aorta: The aortic root is normal in size and structure. Venous: The inferior vena cava is dilated in size with greater than 50% respiratory variability, suggesting right atrial pressure of 8 mmHg. IAS/Shunts: No atrial level shunt detected by color flow Doppler.  LEFT VENTRICLE PLAX 2D LVIDd:         4.29 cm  Diastology LVIDs:         2.26 cm  LV e' lateral:   13.20 cm/s LV PW:         1.63 cm  LV E/e' lateral: 8.8 LV IVS:        1.14 cm  LV e' medial:    8.59 cm/s LVOT diam:     1.70 cm  LV E/e' medial:  13.5 LVOT Area:     2.27 cm  RIGHT VENTRICLE RV S prime:     13.10 cm/s TAPSE (M-mode): 1.9 cm LEFT ATRIUM           Index       RIGHT ATRIUM           Index LA diam:      4.00 cm 1.71 cm/m  RA Area:     23.50 cm LA Vol (A2C): 34.3 ml 14.65 ml/m RA Volume:   77.70 ml  33.18 ml/m LA Vol (A4C): 50.0 ml 21.35 ml/m   AORTA Ao Root diam: 3.00 cm MITRAL VALVE MV Area (PHT): 4.06 cm     SHUNTS MV Decel Time: 187 msec     Systemic Diam: 1.70 cm MV E velocity: 116.00 cm/s MV A velocity: 34.90 cm/s MV E/A ratio:  3.32 Rozann Lesches MD Electronically signed by Rozann Lesches MD Signature Date/Time: 07/10/2019/8:56:12 AM    Final    ECHO TEE  Result Date: 07/11/2019    TRANSESOPHOGEAL ECHO REPORT   Patient Name:   Ethelda Chick Date of Exam: 07/11/2019 Medical Rec #:  [id number]         Height:       66.0 in Accession #:    [id number]        Weight:       289.9 lb Date of Birth:  03/05/1950  BSA:          2.342 m Patient Age:    89 years          BP:           104/53 mmHg Patient Gender: F                 HR:           87 bpm. Exam Location:  [facility] Procedure: Transesophageal Echo,  Cardiac Doppler and Color Doppler Indications:     Atrial fibrillation  History:         Patient has prior history of Echocardiogram examinations, most                  recent 07/10/2019. Arrythmias:Atrial Fibrillation,                  Signs/Symptoms:Shortness of Breath; Risk Factors:Hypertension.  Sonographer:     Dustin Flock RDCS Referring Phys:  [id number] Erma Heritage Diagnosing Phys: Rozann Lesches MD PROCEDURE: After discussion of the risks and benefits of a TEE, an informed consent was obtained. TEE procedure time was 5 minutes. The transesophogeal probe was passed without difficulty through the esophogus of the patient. Imaged were obtained with the patient in a left lateral decubitus position. Local oropharyngeal anesthetic was provided with Cetacaine. Sedation performed by different physician. The patient was monitored while under deep sedation. Anesthestetic sedation was provided intravenously by Anesthesiology: 147.94mg  of Propofol. Image quality was good. The patient's vital signs; including heart rate, blood pressure, and oxygen saturation; remained stable throughout the procedure. The patient developed no complications during  the procedure. IMPRESSIONS  1. Left ventricular ejection fraction, by estimation, is 65 to 70%. The left ventricle has normal function. The left ventricle has no regional wall motion abnormalities. There is mild left ventricular hypertrophy.  2. Right ventricular systolic function is normal. The right ventricular size is normal.  3. No left atrial/left atrial appendage thrombus was detected. The LAA emptying velocity was 40 cm/s.  4. The mitral valve is grossly normal. Trivial mitral valve regurgitation.  5. The aortic valve is tricuspid. Aortic valve regurgitation is not visualized.  6. Evidence of atrial level shunting detected by color flow Doppler. There is a small atrial septal defect with predominantly left to right shunting across the atrial septum. FINDINGS   Left Ventricle: Left ventricular ejection fraction, by estimation, is 65 to 70%. The left ventricle has normal function. The left ventricle has no regional wall motion abnormalities. The left ventricular internal cavity size was normal in size. There is  mild left ventricular hypertrophy. Right Ventricle: The right ventricular size is normal. No increase in right ventricular wall thickness. Right ventricular systolic function is normal. Left Atrium: Left atrial size was normal in size. No left atrial/left atrial appendage thrombus was detected. The LAA emptying velocity was 40 cm/s. Right Atrium: Right atrial size was normal in size. Pericardium: The pericardium was not assessed. Mitral Valve: The mitral valve is grossly normal. Trivial mitral valve regurgitation. Tricuspid Valve: The tricuspid valve is grossly normal. Tricuspid valve regurgitation is mild. Aortic Valve: The aortic valve is tricuspid. Aortic valve regurgitation is not visualized. Pulmonic Valve: The pulmonic valve was not well visualized. Pulmonic valve regurgitation is not visualized. Aorta: The aortic root is normal in size and structure. There is minimal (Grade I) plaque involving the descending aorta. IAS/Shunts: Evidence of atrial level shunting detected by color flow Doppler. There is a small atrial septal defect  with predominantly left to right shunting across the atrial septum. Rozann Lesches MD Electronically signed by Rozann Lesches MD Signature Date/Time: 07/11/2019/12:44:03 PM    Final     Discharge Exam: Vitals:   07/23/19 0510 07/23/19 0945  BP: 115/68   Pulse: 92   Resp: 18   Temp: 97.6 F (36.4 C)   SpO2: 100% 99%   Vitals:   07/22/19 2130 07/22/19 2240 07/23/19 0510 07/23/19 0945  BP: (!) 101/54  115/68   Pulse: 83 83 92   Resp: 18  18   Temp: 98.7 F (37.1 C)  97.6 F (36.4 C)   TempSrc: Oral  Oral   SpO2: 93% 93% 100% 99%  Weight:      Height:        General: Pt is alert, awake, not in acute distress,  morbidly obese Cardiovascular: RRR, S1/S2 +, no rubs, no gallops Respiratory: CTA bilaterally, no wheezing, no rhonchi Abdominal: Soft, NT, ND, bowel sounds + Extremities: no edema, no cyanosis    The results of significant diagnostics from this hospitalization (including imaging, microbiology, ancillary and laboratory) are listed below for reference.     Microbiology: No results found for this or any previous visit (from the past 240 hour(s)).   Labs: BNP (last 3 results) Recent Labs    07/09/19 1253  BNP 045.4*   Basic Metabolic Panel: No results for input(s): NA, K, CL, CO2, GLUCOSE, BUN, CREATININE, CALCIUM, MG, PHOS in the last 168 hours. Liver Function Tests: No results for input(s): AST, ALT, ALKPHOS, BILITOT, PROT, ALBUMIN in the last 168 hours. No results for input(s): LIPASE, AMYLASE in the last 168 hours. No results for input(s): AMMONIA in the last 168 hours. CBC: No results for input(s): WBC, NEUTROABS, HGB, HCT, MCV, PLT in the last 168 hours. Cardiac Enzymes: No results for input(s): CKTOTAL, CKMB, CKMBINDEX, TROPONINI in the last 168 hours. BNP: Invalid input(s): POCBNP CBG: No results for input(s): GLUCAP in the last 168 hours. D-Dimer No results for input(s): DDIMER in the last 72 hours. Hgb A1c No results for input(s): HGBA1C in the last 72 hours. Lipid Profile No results for input(s): CHOL, HDL, LDLCALC, TRIG, CHOLHDL, LDLDIRECT in the last 72 hours. Thyroid function studies No results for input(s): TSH, T4TOTAL, T3FREE, THYROIDAB in the last 72 hours.  Invalid input(s): FREET3 Anemia work up No results for input(s): VITAMINB12, FOLATE, FERRITIN, TIBC, IRON, RETICCTPCT in the last 72 hours. Urinalysis No results found for: COLORURINE, APPEARANCEUR, LABSPEC, [place], GLUCOSEU, HGBUR, BILIRUBINUR, KETONESUR, PROTEINUR, UROBILINOGEN, NITRITE, LEUKOCYTESUR Sepsis Labs Invalid input(s): PROCALCITONIN,  WBC,  LACTICIDVEN Microbiology No results found  for this or any previous visit (from the past 240 hour(s)).   Time coordinating discharge: 35 minutes  SIGNED:   Rodena Goldmann, DO [facility] 07/23/2019, 10:44 AM  If 7PM-7AM, please contact night-coverage [email or web address]

## 2019-07-24 ENCOUNTER — Telehealth: Payer: Self-pay

## 2019-07-24 DIAGNOSIS — J9600 Acute respiratory failure, unspecified whether with hypoxia or hypercapnia: Secondary | ICD-10-CM | POA: Diagnosis not present

## 2019-07-24 DIAGNOSIS — I4891 Unspecified atrial fibrillation: Secondary | ICD-10-CM | POA: Diagnosis not present

## 2019-07-24 DIAGNOSIS — I10 Essential (primary) hypertension: Secondary | ICD-10-CM | POA: Diagnosis not present

## 2019-07-24 DIAGNOSIS — J9611 Chronic respiratory failure with hypoxia: Secondary | ICD-10-CM | POA: Diagnosis not present

## 2019-07-24 DIAGNOSIS — J9612 Chronic respiratory failure with hypercapnia: Secondary | ICD-10-CM | POA: Diagnosis not present

## 2019-07-24 DIAGNOSIS — J45909 Unspecified asthma, uncomplicated: Secondary | ICD-10-CM | POA: Diagnosis not present

## 2019-07-24 NOTE — Telephone Encounter (Signed)
Spoke with pt who states that her daughter will help her place monitor on tomorrow.

## 2019-07-24 NOTE — Telephone Encounter (Signed)
Pt has questions RT:[id number] instructions from [facility] on whether to wear monitor.  Please call  [phone]  Thanks renee

## 2019-07-25 ENCOUNTER — Ambulatory Visit (INDEPENDENT_AMBULATORY_CARE_PROVIDER_SITE_OTHER): Payer: Medicare Other

## 2019-07-25 DIAGNOSIS — Z9181 History of falling: Secondary | ICD-10-CM | POA: Diagnosis not present

## 2019-07-25 DIAGNOSIS — J9612 Chronic respiratory failure with hypercapnia: Secondary | ICD-10-CM | POA: Diagnosis not present

## 2019-07-25 DIAGNOSIS — J9611 Chronic respiratory failure with hypoxia: Secondary | ICD-10-CM | POA: Diagnosis not present

## 2019-07-25 DIAGNOSIS — I4891 Unspecified atrial fibrillation: Secondary | ICD-10-CM | POA: Diagnosis not present

## 2019-07-25 DIAGNOSIS — K219 Gastro-esophageal reflux disease without esophagitis: Secondary | ICD-10-CM | POA: Diagnosis not present

## 2019-07-25 DIAGNOSIS — G4733 Obstructive sleep apnea (adult) (pediatric): Secondary | ICD-10-CM | POA: Diagnosis not present

## 2019-07-25 DIAGNOSIS — I455 Other specified heart block: Secondary | ICD-10-CM | POA: Diagnosis not present

## 2019-07-25 DIAGNOSIS — J45909 Unspecified asthma, uncomplicated: Secondary | ICD-10-CM | POA: Diagnosis not present

## 2019-07-25 DIAGNOSIS — I4819 Other persistent atrial fibrillation: Secondary | ICD-10-CM | POA: Diagnosis not present

## 2019-07-25 DIAGNOSIS — I10 Essential (primary) hypertension: Secondary | ICD-10-CM | POA: Diagnosis not present

## 2019-07-25 DIAGNOSIS — Z7901 Long term (current) use of anticoagulants: Secondary | ICD-10-CM | POA: Diagnosis not present

## 2019-07-25 DIAGNOSIS — Z9981 Dependence on supplemental oxygen: Secondary | ICD-10-CM | POA: Diagnosis not present

## 2019-07-26 ENCOUNTER — Other Ambulatory Visit: Payer: Self-pay | Admitting: *Deleted

## 2019-07-26 ENCOUNTER — Telehealth: Payer: Self-pay | Admitting: Physician Assistant

## 2019-07-26 ENCOUNTER — Encounter: Payer: Self-pay | Admitting: *Deleted

## 2019-07-26 ENCOUNTER — Telehealth: Payer: Self-pay | Admitting: Cardiology

## 2019-07-26 DIAGNOSIS — I4891 Unspecified atrial fibrillation: Secondary | ICD-10-CM | POA: Diagnosis not present

## 2019-07-26 DIAGNOSIS — Z0001 Encounter for general adult medical examination with abnormal findings: Secondary | ICD-10-CM | POA: Diagnosis not present

## 2019-07-26 NOTE — Telephone Encounter (Signed)
Contacted overnight by [employer] that pt has had several long pauses; they explained that there were three pauses in close proximity to each other that together totaled 11 seconds; they could not tell me how long each individual pause lasted.  I was able to reach pt by phone and she stated that she was trying to make some adjustments to her CPAP machine at the time when the pauses occurred. She has been asymptomatic; she denies dizziness/lightheadedness, SOB, fatigue or syncope.

## 2019-07-26 NOTE — Telephone Encounter (Signed)
See separate message written by cardiology fellow overnight. Followup on events overnight. I contacted [employer] (not [employer]), [phone] (Device serial number: [id number]). They will fax the strips to our [place] office for provider to review. Patient is scheduled to see our APP in 1 week. She denies any dizziness or presyncope this morning and is aware to go the ED if she does present with those symptoms. She is currently not on any AV blocking agent. She is on Eliquis for afib and recently underwent cardioversion by Dr. Domenic Polite.

## 2019-07-26 NOTE — Patient Outreach (Signed)
[facility]) Care Management [facility] CM Telephone Outreach, EMMI Red Alert notification/ General discharge PCP completes Transition of Care follow up post-hospital discharge Post-hospital discharge day # 3  07/26/2019  Carolyn Silva 1950/02/20 [id number]  EMMI Red-Alert notification/ General Discharge EMMI call date/ day #: Thursday July 25, 2019; day # 1 Red-Alert reason(s): "No scheduled follow up"  Successful telephone outreach to [place], 69 y/o female referred to [facility] RN CM this morning by [facility] CMA for [place] notification as above.  Patient has recent hospital admission May 28-July 23, 2019 for A-flutter with shortness of breath; she was discharged home to self-care without home health services in place.  Patient has history including, but not limited to, A-Fib; asthma; HTN; morbid obesity.  HIPAA/ identity verified during phone call and purpose of call/ [facility], [facility], [facility] CM services were discussed with patient; patient provides verbal consent to complete EMMI screening call and this was completed.  She declines ongoing [facility] CM follow up, stating no needs.  Patient denies pain today and reports that she has "several" follow up appointments scheduled; reports no transportation issues, states son whom she lives with provides all transportation to provider appointments.  We reviewed upcoming scheduled provider appointments today and patient verbalizes accurate understanding of all, with plans to attend all as schedule: states has follow up office visits with cardiology, pulmonary and PCP providers.  We reviewed post-hospital discharge instructions and patient reports she has obtained all medications and is taking all as prescribed; reports has not needed to use rescue inhaler post-hospital discharge.  EMMI screening completed and no concerns were identified.  Patient denies ongoing need for care management/ coordination/ or disease management; she reports that her PCP office is  currently contacting her and she agrees to call [facility] CM team should needs arise in the future once she receives [facility] CM letter.  Plan:  Will make patient inactive with [facility] CM as she declines ongoing care management/ coordination needs and no concerns were identified during screening call today and will make patient's PCP aware of same.  Oneta Rack, RN, BSN, [employer] [facility] Care Management  [phone]

## 2019-07-27 ENCOUNTER — Telehealth: Payer: Self-pay | Admitting: Internal Medicine

## 2019-07-27 NOTE — Telephone Encounter (Signed)
Noted.  Last available ECG in the chart shows sinus rhythm.  Strips from recent pauses documented were to be faxed to the [place] office per Mr. Eulah Citizen.  Patient reportedly asymptomatic when contacted.  She is currently not on any AV nodal blockers and continues on Eliquis with recent cardioversion.  Chart indicates office follow-up planned for next week.  Will forward to [place] staff to make sure that representative strips are pulled and documented in the chart.

## 2019-07-27 NOTE — Telephone Encounter (Signed)
Received a call from [facility] that Carolyn Silva had two ~3 second long pauses. Will update Dr. Domenic Polite.   Alric Quan, MD Cardiology [place]

## 2019-07-28 ENCOUNTER — Emergency Department (HOSPITAL_COMMUNITY): Payer: Medicare Other

## 2019-07-28 ENCOUNTER — Telehealth: Payer: Self-pay | Admitting: Cardiology

## 2019-07-28 ENCOUNTER — Other Ambulatory Visit: Payer: Self-pay

## 2019-07-28 ENCOUNTER — Encounter (HOSPITAL_COMMUNITY): Payer: Self-pay | Admitting: *Deleted

## 2019-07-28 ENCOUNTER — Emergency Department (HOSPITAL_COMMUNITY)
Admission: EM | Admit: 2019-07-28 | Discharge: 2019-07-28 | Disposition: A | Discharge status: Home / Self Care | Payer: Medicare Other | Attending: Emergency Medicine | Admitting: Emergency Medicine

## 2019-07-28 DIAGNOSIS — J45901 Unspecified asthma with (acute) exacerbation: Secondary | ICD-10-CM | POA: Diagnosis not present

## 2019-07-28 DIAGNOSIS — R600 Localized edema: Secondary | ICD-10-CM | POA: Diagnosis not present

## 2019-07-28 DIAGNOSIS — Z7901 Long term (current) use of anticoagulants: Secondary | ICD-10-CM | POA: Diagnosis not present

## 2019-07-28 DIAGNOSIS — I10 Essential (primary) hypertension: Secondary | ICD-10-CM | POA: Diagnosis not present

## 2019-07-28 DIAGNOSIS — I517 Cardiomegaly: Secondary | ICD-10-CM | POA: Diagnosis not present

## 2019-07-28 DIAGNOSIS — Z79899 Other long term (current) drug therapy: Secondary | ICD-10-CM | POA: Diagnosis not present

## 2019-07-28 DIAGNOSIS — I499 Cardiac arrhythmia, unspecified: Secondary | ICD-10-CM | POA: Insufficient documentation

## 2019-07-28 DIAGNOSIS — R002 Palpitations: Secondary | ICD-10-CM | POA: Diagnosis not present

## 2019-07-28 DIAGNOSIS — J984 Other disorders of lung: Secondary | ICD-10-CM | POA: Diagnosis not present

## 2019-07-28 DIAGNOSIS — R0602 Shortness of breath: Secondary | ICD-10-CM | POA: Diagnosis not present

## 2019-07-28 HISTORY — DX: Paroxysmal atrial fibrillation: I48.0

## 2019-07-28 LAB — CBC WITH DIFFERENTIAL/PLATELET
Abs Immature Granulocytes: 0.02 10*3/uL (ref 0.00–0.07)
Basophils Absolute: 0 10*3/uL (ref 0.0–0.1)
Basophils Relative: 0 %
Eosinophils Absolute: 0.4 10*3/uL (ref 0.0–0.5)
Eosinophils Relative: 6 %
HCT: 38.2 % (ref 36.0–46.0)
Hemoglobin: 11.8 g/dL — ABNORMAL LOW (ref 12.0–15.0)
Immature Granulocytes: 0 %
Lymphocytes Relative: 21 %
Lymphs Abs: 1.4 10*3/uL (ref 0.7–4.0)
MCH: 27.4 pg (ref 26.0–34.0)
MCHC: 30.9 g/dL (ref 30.0–36.0)
MCV: 88.6 fL (ref 80.0–100.0)
Monocytes Absolute: 0.5 10*3/uL (ref 0.1–1.0)
Monocytes Relative: 7 %
Neutro Abs: 4.4 10*3/uL (ref 1.7–7.7)
Neutrophils Relative %: 66 %
Platelets: 241 10*3/uL (ref 150–400)
RBC: 4.31 MIL/uL (ref 3.87–5.11)
RDW: 14.7 % (ref 11.5–15.5)
WBC: 6.7 10*3/uL (ref 4.0–10.5)
nRBC: 0 % (ref 0.0–0.2)

## 2019-07-28 LAB — BASIC METABOLIC PANEL
Anion gap: 9 (ref 5–15)
BUN: 8 mg/dL (ref 8–23)
CO2: 26 mmol/L (ref 22–32)
Calcium: 8.9 mg/dL (ref 8.9–10.3)
Chloride: 104 mmol/L (ref 98–111)
Creatinine, Ser: 0.74 mg/dL (ref 0.44–1.00)
GFR calc Af Amer: >60 mL/min (ref >60)
GFR calc non Af Amer: >60 mL/min (ref >60)
Glucose, Bld: 104 mg/dL — ABNORMAL HIGH (ref 70–99)
Potassium: 3.9 mmol/L (ref 3.5–5.1)
Sodium: 139 mmol/L (ref 135–145)

## 2019-07-28 LAB — MAGNESIUM: Magnesium: 1.9 mg/dL (ref 1.7–2.4)

## 2019-07-28 NOTE — ED Notes (Signed)
Spoke with Abbe Amsterdam at [facility] will page Cardiologist on call for Dr. Sabra Heck.

## 2019-07-28 NOTE — Discharge Instructions (Signed)
Your testing today has not shown any specific abnormalities and it has been very reassuring.  I spoke with the cardiologist on-call, Dr. Rayann Heman and he wants you to follow-up with the electrophysiology heart doctors.  He will have the clinic reach out to you to follow-up and make this appointment.  Please do not drive a car until you have been seen by the heart doctors and if you have any symptoms such as palpitations, racing heart rate, feel like you are lightheaded or going to pass out then you should return to the emergency department immediately.

## 2019-07-28 NOTE — ED Notes (Signed)
Spoke with Lattie Haw with [employer] help center. Per Lattie Haw on record they are only are showing that the patient was contacted on Friday 07/26/19 that her monitor strips were faxed to her PCP.

## 2019-07-28 NOTE — ED Provider Notes (Signed)
[facility] EMERGENCY DEPARTMENT Provider Note   CSN: [id number] Arrival date & time: 07/28/19  0753     History Chief Complaint  Patient presents with  . Palpitations    Carolyn Silva is a 69 y.o. female.  HPI   This patient is a 69 year old female, she has a history of asthma hypertension and a recent history of new onset atrial fibrillation or atrial flutter.  She has had a couple of visits to the hospital with admissions during which time she was cardioverted.  She had some hypercapnic respiratory issues during that stay as well.  She was in her usual state of health this morning, she woke up, was not having any palpitations or shortness of breath and actually was feeling good but had a phone call from the company that does her heart monitoring, [employer], and told her that she needed to come to the hospital because of rapid heartbeats.  The patient denies fever, chills, nausea, vomiting, diarrhea, dysuria, excessive swelling, chest pain, coughing, shortness of breath, headache, numbness, weakness, blurred vision or any other symptoms.  She has been taking her medications and in fact did take her medications this morning including her Eliquis which she has been compliant with.  She denies increased salt intake, increase stimulant intake, does not drink any caffeine.  At this time the patient is feeling good, was able to ambulate to the room though appeared significantly dyspneic with that process.  She states that she does get dyspneic when she walks which is usual for her  Past Medical History:  Diagnosis Date  . Asthma   . Hypertension   . Paroxysmal atrial fibrillation [facility])     Patient Active Problem List   Diagnosis Date Noted  . Acute respiratory failure with hypercapnia ([place]) 07/13/2019  . Hypoxia   . Asthma exacerbation 07/09/2019  . Atrial fibrillation ([place]) 07/09/2019  . Essential hypertension 07/09/2019  . Special screening for malignant neoplasms, colon     Past  Surgical History:  Procedure Laterality Date  . CARDIOVERSION N/A 07/11/2019   Procedure: CARDIOVERSION;  Surgeon: Satira Sark, MD;  Location: [facility] ORS;  Service: Cardiovascular;  Laterality: N/A;  . COLONOSCOPY N/A 09/14/2017   Procedure: COLONOSCOPY;  Surgeon: Danie Binder, MD;  Location: [facility] ENDO SUITE;  Service: Endoscopy;  Laterality: N/A;  2:00  . TEE WITHOUT CARDIOVERSION N/A 07/11/2019   Procedure: TRANSESOPHAGEAL ECHOCARDIOGRAM (TEE) WITH PROPOFOL;  Surgeon: Satira Sark, MD;  Location: [facility] ORS;  Service: Cardiovascular;  Laterality: N/A;  . TUBAL LIGATION       OB History    Gravida      Para      Term      Preterm      AB      Living  5     SAB      TAB      Ectopic      Multiple      Live Births              Family History  Problem Relation Age of Onset  . Diabetes Mother   . Cirrhosis Father   . Diabetes Brother   . Kidney disease Sister   . Colon cancer Neg Hx   . Colon polyps Neg Hx     Social History   Tobacco Use  . Smoking status: Never Smoker  . Smokeless tobacco: Never Used  Vaping Use  . Vaping Use: Never used  Substance Use Topics  .  Alcohol use: Yes    Comment: Occasional  . Drug use: No    Home Medications Prior to Admission medications   Medication Sig Start Date End Date Taking? Authorizing Provider  albuterol (VENTOLIN HFA) 108 (90 Base) MCG/ACT inhaler Inhale 1-2 puffs into the lungs every 6 (six) hours as needed for wheezing or shortness of breath. 07/11/19   Johnson, Clanford L, MD  ELIQUIS 5 MG TABS tablet Take 5 mg by mouth 2 (two) times daily. 07/02/19   [provider]  lisinopril (ZESTRIL) 10 MG tablet Take 1 tablet (10 mg total) by mouth daily. 07/24/19 08/23/19  Heath Lark D, DO    Allergies    Strawberry c [ascorbic acid]  Review of Systems   Review of Systems  All other systems reviewed and are negative.   Physical Exam Updated Vital Signs BP (!) 147/69 (BP Location: Right Arm)    Pulse (!) 102   Temp 98.3 F (36.8 C)   Resp (!) 22   Ht 1.676 m (5\' 6" )   Wt 134 kg   SpO2 95%   BMI 47.68 kg/m   Physical Exam Vitals and nursing note reviewed.  Constitutional:      General: She is not in acute distress.    Appearance: She is well-developed.  HENT:     Head: Normocephalic and atraumatic.     Mouth/Throat:     Pharynx: No oropharyngeal exudate.  Eyes:     General: No scleral icterus.       Right eye: No discharge.        Left eye: No discharge.     Conjunctiva/sclera: Conjunctivae normal.     Pupils: Pupils are equal, round, and reactive to light.  Neck:     Thyroid: No thyromegaly.     Vascular: No JVD.  Cardiovascular:     Rate and Rhythm: Normal rate and regular rhythm.     Heart sounds: Normal heart sounds. No murmur heard.  No friction rub. No gallop.      Comments: The patient's heart rate is regular, it is in a normal sinus rhythm without any tachycardia or irregularity Pulmonary:     Breath sounds: Normal breath sounds. No wheezing or rales.     Comments: The patient is tachypneic when she is ambulating but when she is resting on the stretcher she feels good and has a normal respiratory pattern, lungs are clear Abdominal:     General: Bowel sounds are normal. There is no distension.     Palpations: Abdomen is soft. There is no mass.     Tenderness: There is no abdominal tenderness.  Musculoskeletal:        General: No tenderness. Normal range of motion.     Cervical back: Normal range of motion and neck supple.     Right lower leg: Edema present.     Left lower leg: Edema present.     Comments: Mild edema of the bilateral legs, the patient states this is improving over time  Lymphadenopathy:     Cervical: No cervical adenopathy.  Skin:    General: Skin is warm and dry.     Findings: No erythema or rash.  Neurological:     Mental Status: She is alert.     Coordination: Coordination normal.  Psychiatric:        Behavior: Behavior normal.      ED Results / Procedures / Treatments   Labs (all labs ordered are listed, but only abnormal results are  displayed) Labs Reviewed  CBC WITH DIFFERENTIAL/PLATELET - Abnormal; Notable for the following components:      Result Value   Hemoglobin 11.8 (*)    All other components within normal limits  BASIC METABOLIC PANEL - Abnormal; Notable for the following components:   Glucose, Bld 104 (*)    All other components within normal limits  MAGNESIUM    EKG EKG Interpretation  Date/Time:  Sunday July 28 2019 08:02:29 EDT Ventricular Rate:  97 PR Interval:    QRS Duration: 78 QT Interval:  343 QTC Calculation: 436 R Axis:   -39 Text Interpretation: Ectopic atrial rhythm Left axis deviation since last tracing no significant change Baseline wander Confirmed by Noemi Chapel [phone]) on 07/28/2019 8:07:27 AM   Radiology DG Chest Port 1 View  Result Date: 07/28/2019 CLINICAL DATA:  Irregular heartbeat.  Shortness of breath. EXAM: PORTABLE CHEST 1 VIEW COMPARISON:  07/19/2019; 07/12/2019 FINDINGS: Grossly unchanged enlarged cardiac silhouette and mediastinal contours given AP projection. Redemonstrated prominence of the central pulmonary vasculature. No focal airspace opacities. No pleural effusion or pneumothorax. No evidence of edema. No acute osseous abnormalities. IMPRESSION: Similar findings of cardiomegaly without superimposed acute cardiopulmonary disease on this AP portable examination. Specifically, no definite evidence of pulmonary edema. Electronically Signed   By: Sandi Mariscal M.D.   On: 07/28/2019 08:50    Procedures Procedures (including critical care time)  Medications Ordered in ED Medications - No data to display  ED Course  I have reviewed the triage vital signs and the nursing notes.  Pertinent labs & imaging results that were available during my care of the patient were reviewed by me and considered in my medical decision making (see chart for details).    MDM  Rules/Calculators/A&P                          I will attempt to get a hold of the patient's monitoring company to see what arrhythmia they were witnessing.  At this time the patient appears to be in a normal sinus rhythm without significant arrhythmia.  She will need to be monitored, I will check some basic labs to make sure she does not have some hypokalemia or hyperkalemia and to evaluate for electrolyte status.  She is in agreement with the plan.  She is not having chest pain or shortness of breath.  I have reviewed the medical record, the cardiology team had called to inform the patient this morning that she was having pauses on her EKG.  There was a over 7-second pause that appeared to come after a Mobitz type II block.  It was recommended that she go to the hospital for evaluation.  The patient is not having symptoms associated with this though they did occur at the early hours of the morning when the patient was likely asleep.  Notes from yesterday reflect that she was having several pauses at that time as well, the pause today seemed longer  I have reviewed the patient's medical record and the medications that she currently takes, she does not take a beta-blocker or AV nodal blocking agent.  She is on lisinopril for hypertension, she takes albuterol and she takes Eliquis  9:25 AM Cardiac monitoring reveals normal sinus rhythm (Rate & rhythm), as reviewed and interpreted by me. Cardiac monitoring was ordered due to history of arrhythmia and to monitor patient for dysrhythmia.  I discussed the case with Dr. Rayann Heman, he has looked at the patient's  chart, reviewed rhythm history and agrees that the patient can be seen outpatient by the EP team, at this time the patient is not having any symptoms, she did not have any symptoms with her nocturnal bradycardia that was reported.  She has not had any syncope.  No further cardiac events on the monitor through her entire stay, electrolytes and labs were  totally normal, the patient is stable for discharge to follow-up.  She is aware of the indications for return, cardiology will reach out to patient to make appointment for electrophysiology appointment   Final Clinical Impression(s) / ED Diagnoses Final diagnoses:  Irregular heart rate    Rx / DC Orders ED Discharge Orders    None       Noemi Chapel, MD 07/28/19 [phone]

## 2019-07-28 NOTE — ED Triage Notes (Signed)
Patient comes to the ED after being notified of an irregular heart beat.  Patient wears a heart monitor.

## 2019-07-28 NOTE — Telephone Encounter (Signed)
Cardiology Brief Note  I have received page from [facility] regarding prolonged pause noted for 7.4sec on Ms. Tufano's monitor. I was able to speak to Ms. Crocket who was asymptomatic during this episode.   [employer] has emailed me the monitor read and it appears to be 2nd degree AVB Mobitz type II. I have told Ms. Cerros that we will reach out in the morning 3-4 hrs from now regarding decision based on EPS support. Patient does not take any beta blocker agents.   Signed: Hessie Knows 07/28/2019, 3:25 AM

## 2019-07-28 NOTE — Telephone Encounter (Signed)
Cardiology Brief Note  Ms. Nyman had repeat episode of 7.4 sec pause at [facility] notified by [employer].   I called Ms. Kratky and advised her to come to [facility] for admission.  Signed: Hessie Knows 07/28/2019, 7:24 AM

## 2019-07-29 ENCOUNTER — Telehealth: Payer: Self-pay

## 2019-07-29 NOTE — Telephone Encounter (Signed)
Pt called office to update on how she is feeling. Pt states she is feeling fine denies SOB, CP, syncope, rapid heartbeat at this time. Pt was seen in ER on yesterday a call regarding [employer] monitor. Pt was told to call and make sooner appt with office. Pt has appt with Dr. Rayann Heman on 07/31/19 Please advise.

## 2019-07-29 NOTE — Telephone Encounter (Signed)
I have reviewed ED note.   Dr Rayann Heman reviewed strips and said to make appt in EP clinic   he did not specify provider Please confirm that this is first available   If so, keep on 16  Patient to call if symtoms (dizzy)

## 2019-07-29 NOTE — Telephone Encounter (Signed)
Pt would like to give update on how she feeling.Requesting earlier appt.   Please call [phone]   Thanks renee

## 2019-07-29 NOTE — Telephone Encounter (Signed)
Pt agrees with dispo, will call if dizziness is worrisome.

## 2019-07-30 ENCOUNTER — Telehealth: Payer: Self-pay

## 2019-07-30 DIAGNOSIS — Z0001 Encounter for general adult medical examination with abnormal findings: Secondary | ICD-10-CM | POA: Diagnosis not present

## 2019-07-30 NOTE — Telephone Encounter (Signed)
Received call from [employer] that at 9:04 pm last night, patient had a 4.8 second pause.   I will FYI Dr.Allred

## 2019-07-31 ENCOUNTER — Telehealth (INDEPENDENT_AMBULATORY_CARE_PROVIDER_SITE_OTHER): Payer: Medicare Other | Admitting: Internal Medicine

## 2019-07-31 ENCOUNTER — Other Ambulatory Visit: Payer: Self-pay

## 2019-07-31 ENCOUNTER — Encounter: Payer: Self-pay | Admitting: Internal Medicine

## 2019-07-31 VITALS — Ht 66.0 in | Wt 280.0 lb

## 2019-07-31 DIAGNOSIS — I10 Essential (primary) hypertension: Secondary | ICD-10-CM | POA: Diagnosis not present

## 2019-07-31 DIAGNOSIS — I455 Other specified heart block: Secondary | ICD-10-CM | POA: Diagnosis not present

## 2019-07-31 DIAGNOSIS — D6869 Other thrombophilia: Secondary | ICD-10-CM

## 2019-07-31 DIAGNOSIS — I4819 Other persistent atrial fibrillation: Secondary | ICD-10-CM

## 2019-07-31 NOTE — Progress Notes (Signed)
Electrophysiology TeleHealth Note   Due to national recommendations of social distancing due to [place] 19, Audio  telehealth visit is felt to be most appropriate for this patient at this time.  See MyChart message from today for patient consent regarding telehealth for [facility].   Date:  07/31/2019   ID:  Carolyn Silva, DOB May 11, 1950, MRN [id number]  Location: home  Provider location: [place] Evaluation Performed: New patient consult  PCP:  Celene Squibb, MD  Cardiologist:  Rozann Lesches, MD  Electrophysiologist:  None   Chief Complaint:  Bradycardia/ afib  History of Present Illness:    Carolyn Silva is a 69 y.o. female who presents via audio/video conferencing for a telehealth visit today.   The patient is referred for new consultation regarding bradycardia/ afib by Dr Sabra Heck from [place].    She initially presented to [facility] ED 07/10/19 with symptoms of worsening fatigue and SOB.  She was found to have afib.  She was initiated on eliquis and diltiazem (Dr McDowells/ consult note is reviewed).  During her hospital stay, she was noted to have nocturnal pauses.  She was converted to sinus rhythm with [facility] by Dr Domenic Polite 07/11/2019. She was discharged but readmitted 07/13/19 with respiratory failure.  She was subsequently discharged and has made slow and steady progress since that time.  She had [employer] monitor placed which has documented ongoing issues (per my discussion with Dr Sabra Heck this past weekend) of nocturnal pauses.  She was referred by the monitoring company to [facility] ED 07/28/2019 for asymptomatic pauses.  Per Dr Ammie Ferrier request, I am doing a virtual visit today.  The patient reports doing well currently.  She denies symptoms of palpitations, fatigue and SOB.  She reports feeling very well.  She is not interested in further management at this time. Today, she denies symptoms of palpitations, chest pain, shortness of breath, orthopnea, PND, lower  extremity edema, claudication, dizziness, presyncope, syncope, bleeding, or neurologic sequela. The patient is tolerating medications without difficulties and is otherwise without complaint today.     Past Medical History:  Diagnosis Date  . Asthma   . Hypertension   . Paroxysmal atrial fibrillation [facility])     Past Surgical History:  Procedure Laterality Date  . CARDIOVERSION N/A 07/11/2019   Procedure: CARDIOVERSION;  Surgeon: Satira Sark, MD;  Location: [facility] ORS;  Service: Cardiovascular;  Laterality: N/A;  . COLONOSCOPY N/A 09/14/2017   Procedure: COLONOSCOPY;  Surgeon: Danie Binder, MD;  Location: [facility] ENDO SUITE;  Service: Endoscopy;  Laterality: N/A;  2:00  . TEE WITHOUT CARDIOVERSION N/A 07/11/2019   Procedure: TRANSESOPHAGEAL ECHOCARDIOGRAM (TEE) WITH PROPOFOL;  Surgeon: Satira Sark, MD;  Location: [facility] ORS;  Service: Cardiovascular;  Laterality: N/A;  . TUBAL LIGATION      Current Outpatient Medications  Medication Sig Dispense Refill  . albuterol (VENTOLIN HFA) 108 (90 Base) MCG/ACT inhaler Inhale 1-2 puffs into the lungs every 6 (six) hours as needed for wheezing or shortness of breath. 18 g 2  . ELIQUIS 5 MG TABS tablet Take 5 mg by mouth 2 (two) times daily.    Marland Kitchen lisinopril (ZESTRIL) 10 MG tablet Take 1 tablet (10 mg total) by mouth daily. 30 tablet 3   No current facility-administered medications for this visit.    Allergies:   Strawberry c [ascorbic acid]   Social History:  The patient  reports that she has never smoked. She has never used smokeless tobacco. She  reports current alcohol use. She reports that she does not use drugs.   Family History:  The patient's  family history includes Cirrhosis in her father; Diabetes in her brother and mother; Kidney disease in her sister.    ROS:  Please see the history of present illness.   All other systems are personally reviewed and negative.    Exam:    Vital Signs:  Ht 5\' 6"  (1.676 m)   Wt 280 lb (127 kg)    BMI 45.19 kg/m    Well sounding, alert and conversant    Labs/Other Tests and Data Reviewed:    Recent Labs: 07/09/2019: B Natriuretic Peptide 281.0; TSH 1.908 07/13/2019: ALT 40 07/28/2019: BUN 8; Creatinine, Ser 0.74; Hemoglobin 11.8; Magnesium 1.9; Platelets 241; Potassium 3.9; Sodium 139   Wt Readings from Last 3 Encounters:  07/31/19 280 lb (127 kg)  07/28/19 295 lb 6.7 oz (134 kg)  07/19/19 297 lb 2.9 oz (134.8 kg)     Other studies personally reviewed: Additional studies/ records that were reviewed today include: Dr Chuck Hint records, recent cardioversion records, Dr Sanjuan Dame notes  Review of the above records today demonstrates: as above   ASSESSMENT & PLAN:    1.  Nocturnal pauses Asymptomatic No further EP workup currently I would advise sleep study  2. Persistent afib Recently diagnosed She is on eliquis for chads2vasc score of at least 3.  Ultimately, lifestyle modification is essential for her to do well.  Could consider initiation of tikosyn in the future, however she is currently not interested in changes.  3. Morbid obesity Body mass index is 45.19 kg/m. Lifestyle modification is advised  4. Hypertensive cardiovascular disease Stable No change required today   Follow-up:  She already has general cardiology follow-up arranged  I will see as needed.  Should she have symptomatic or daytime pauses then we could revisit pacing at that time.  Patient Risk:  after full review of this patients clinical status, I feel that they are at moderate risk at this time.   Today, I have spent 20 minutes with the patient with telehealth technology discussing bradycardia .    Signed, Thompson Grayer MD, [place] 07/31/2019 4:35 PM   [address] [place] [phone] (office) [phone] (fax)

## 2019-08-01 ENCOUNTER — Ambulatory Visit (INDEPENDENT_AMBULATORY_CARE_PROVIDER_SITE_OTHER): Payer: Medicare Other | Admitting: Student

## 2019-08-01 ENCOUNTER — Encounter: Payer: Self-pay | Admitting: Student

## 2019-08-01 ENCOUNTER — Other Ambulatory Visit: Payer: Self-pay

## 2019-08-01 VITALS — BP 134/66 | HR 82 | Ht 66.0 in | Wt 281.0 lb

## 2019-08-01 DIAGNOSIS — I4819 Other persistent atrial fibrillation: Secondary | ICD-10-CM

## 2019-08-01 DIAGNOSIS — I455 Other specified heart block: Secondary | ICD-10-CM | POA: Diagnosis not present

## 2019-08-01 DIAGNOSIS — K219 Gastro-esophageal reflux disease without esophagitis: Secondary | ICD-10-CM | POA: Diagnosis not present

## 2019-08-01 DIAGNOSIS — I10 Essential (primary) hypertension: Secondary | ICD-10-CM | POA: Diagnosis not present

## 2019-08-01 DIAGNOSIS — Z9181 History of falling: Secondary | ICD-10-CM | POA: Diagnosis not present

## 2019-08-01 DIAGNOSIS — J9612 Chronic respiratory failure with hypercapnia: Secondary | ICD-10-CM | POA: Diagnosis not present

## 2019-08-01 DIAGNOSIS — J45909 Unspecified asthma, uncomplicated: Secondary | ICD-10-CM | POA: Diagnosis not present

## 2019-08-01 DIAGNOSIS — J9611 Chronic respiratory failure with hypoxia: Secondary | ICD-10-CM | POA: Diagnosis not present

## 2019-08-01 DIAGNOSIS — Z9981 Dependence on supplemental oxygen: Secondary | ICD-10-CM | POA: Diagnosis not present

## 2019-08-01 DIAGNOSIS — I4891 Unspecified atrial fibrillation: Secondary | ICD-10-CM | POA: Diagnosis not present

## 2019-08-01 DIAGNOSIS — G4733 Obstructive sleep apnea (adult) (pediatric): Secondary | ICD-10-CM | POA: Diagnosis not present

## 2019-08-01 DIAGNOSIS — Z7901 Long term (current) use of anticoagulants: Secondary | ICD-10-CM | POA: Diagnosis not present

## 2019-08-01 MED ORDER — ELIQUIS 5 MG PO TABS: 5.0000 mg | ORAL_TABLET | Freq: Two times a day (BID) | ORAL | 11 refills | Status: DC

## 2019-08-01 NOTE — Progress Notes (Signed)
Cardiology Office Note    Date:  08/01/2019   ID:  Carolyn Silva, DOB [birth year], MRN [id number]  PCP:  Celene Squibb, MD  Cardiologist: Rozann Lesches, MD    Chief Complaint  Patient presents with  . Hospitalization Follow-up    History of Present Illness:    Carolyn Silva is a 69 y.o. female with past medical history of recently diagnosed persistent atrial fibrillation (s/o DCCV on 07/11/2019), HTN, morbid obesity and asthma who presents to the office today for hospital follow-up.  She was recently admitted to [facility] from 5/25 - 07/11/2019 for evaluation of worsening dyspnea on exertion and was found to be in atrial fibrillation which was a new diagnosis for her. CTA was performed and showed no evidence of a PE. BNP was mildly elevated at 281. She did have significant pauses during admission which occurred up to 6.29 seconds and it was unclear if the patient was sleeping or awake at that time. Echocardiogram was performed and showed a preserved EF of 70 to 75% with no regional wall motion normalities and no significant valve abnormalities. Given her intermittent pauses, TEE guided cardioversion was recommended and this was performed by Dr. Domenic Polite on 07/11/2019 with return to normal sinus rhythm following a single synchronized 120 J shock. She was discharged on Eliquis for anticoagulation but was not placed on AV nodal blocking agents. An outpatient monitor was recommended to assess for recurrent pauses.  She was again admitted from 5/28 - 07/23/2019 for evaluation of worsening dyspnea. At the time of ED arrival, she was significantly tachycardic (EKG most consistent with atrial flutter with RVR) and hypoxic with DCCV performed while in the ED. She was admitted for further management of her acute hypoxic respiratory failure with hypercapnia and required BiPAP which was weaned to nasal cannula during admission. She was followed by [facility] Pulmonology during admission and an outpatient  sleep study was recommended.  Since hospital discharge, she was set up for home monitor and this showed prolonged pauses of up to 7.4 seconds which occurred while the patient was sleeping. Given her significant pauses, ED evaluation was recommended. She denied any symptoms at that time and the EDP spoke with Dr. Rayann Heman who recommended outpatient EP evaluation given her asymptomatic state. She did have a telehealth visit with Dr. Rayann Heman on 07/31/2019 and denied any recent dizziness or presyncope at that time. Given her nocturnal pauses, he recommended an outpatient sleep study and no further cardiac work-up at that time. In regards to her atrial fibrillation, lifestyle modification and a rate control strategy was recommended as she was not interested in additional medical therapy such as Tikosyn.  In talking with the patient today, she reports feeling the best she has felt in months. Her breathing is now back to baseline and she denies any specific dyspnea on exertion, orthopnea or PND. No recent chest pain or palpitations. She has only used her Albuterol inhaler once since hospital discharge. She is using NIV at night and has scheduled follow-up with Pulmonology next week to arrange for a formal sleep study.    Past Medical History:  Diagnosis Date  . Asthma   . Hypertension   . Paroxysmal atrial fibrillation [facility])     Past Surgical History:  Procedure Laterality Date  . CARDIOVERSION N/A 07/11/2019   Procedure: CARDIOVERSION;  Surgeon: Satira Sark, MD;  Location: [facility] ORS;  Service: Cardiovascular;  Laterality: N/A;  . COLONOSCOPY N/A 09/14/2017   Procedure: COLONOSCOPY;  Surgeon: Oneida Alar,  Marga Melnick, MD;  Location: [facility] ENDO SUITE;  Service: Endoscopy;  Laterality: N/A;  2:00  . TEE WITHOUT CARDIOVERSION N/A 07/11/2019   Procedure: TRANSESOPHAGEAL ECHOCARDIOGRAM (TEE) WITH PROPOFOL;  Surgeon: Satira Sark, MD;  Location: [facility] ORS;  Service: Cardiovascular;  Laterality: N/A;  . TUBAL LIGATION        Current Medications: Outpatient Medications Prior to Visit  Medication Sig Dispense Refill  . albuterol (VENTOLIN HFA) 108 (90 Base) MCG/ACT inhaler Inhale 1-2 puffs into the lungs every 6 (six) hours as needed for wheezing or shortness of breath. 18 g 2  . lisinopril (ZESTRIL) 10 MG tablet Take 10 mg by mouth in the morning and at bedtime.    Marland Kitchen ELIQUIS 5 MG TABS tablet Take 5 mg by mouth 2 (two) times daily.    Marland Kitchen lisinopril (ZESTRIL) 10 MG tablet Take 1 tablet (10 mg total) by mouth daily. (Patient not taking: Reported on 08/01/2019) 30 tablet 3   No facility-administered medications prior to visit.     Allergies:   Strawberry c [ascorbic acid]   Social History   Socioeconomic History  . Marital status: Single    Spouse name: Not on file  . Number of children: Not on file  . Years of education: Not on file  . Highest education level: Not on file  Occupational History  . Not on file  Tobacco Use  . Smoking status: Never Smoker  . Smokeless tobacco: Never Used  Vaping Use  . Vaping Use: Never used  Substance and Sexual Activity  . Alcohol use: Yes    Comment: Occasional  . Drug use: No  . Sexual activity: Not on file  Other Topics Concern  . Not on file  Social History Narrative  . Not on file   Social Determinants of Health   Financial Resource Strain:   . Difficulty of Paying Living Expenses:   Food Insecurity: No Food Insecurity  . Worried About Charity fundraiser in the Last Year: Never true  . Ran Out of Food in the Last Year: Never true  Transportation Needs: No Transportation Needs  . Lack of Transportation (Medical): No  . Lack of Transportation (Non-Medical): No  Physical Activity:   . Days of Exercise per Week:   . Minutes of Exercise per Session:   Stress:   . Feeling of Stress :   Social Connections:   . Frequency of Communication with Friends and Family:   . Frequency of Social Gatherings with Friends and Family:   . Attends Religious  Services:   . Active Member of Clubs or Organizations:   . Attends Archivist Meetings:   Marland Kitchen Marital Status:      Family History:  The patient's family history includes Cirrhosis in her father; Diabetes in her brother and mother; Kidney disease in her sister.   Review of Systems:   Please see the history of present illness.     General:  No chills, fever, night sweats or weight changes.  Cardiovascular:  No chest pain, dyspnea on exertion, edema, orthopnea, palpitations, paroxysmal nocturnal dyspnea. Dermatological: No rash, lesions/masses Respiratory: No cough, dyspnea. Positive for daytime somnolence.  Urologic: No hematuria, dysuria Abdominal:   No nausea, vomiting, diarrhea, bright red blood per rectum, melena, or hematemesis Neurologic:  No visual changes, wkns, changes in mental status. All other systems reviewed and are otherwise negative except as noted above.   Physical Exam:    VS:  BP 134/66   Pulse 82  Ht 5\' 6"  (1.676 m)   Wt 281 lb (127.5 kg)   SpO2 92%   BMI 45.35 kg/m    General: Well developed, obese female appearing in no acute distress. Head: Normocephalic, atraumatic, sclera non-icteric.  Neck: No carotid bruits. JVD not elevated.  Lungs: Respirations regular and unlabored, without wheezes or rales.  Heart: Regular rate and rhythm. No S3 or S4.  No murmur, no rubs, or gallops appreciated. Abdomen: Soft, non-tender, non-distended. No obvious abdominal masses. Msk:  Strength and tone appear normal for age. No obvious joint deformities or effusions. Extremities: No clubbing or cyanosis. No lower extremity edema.  Distal pedal pulses are 2+ bilaterally. Neuro: Alert and oriented X 3. Moves all extremities spontaneously. No focal deficits noted. Psych:  Responds to questions appropriately with a normal affect. Skin: No rashes or lesions noted  Wt Readings from Last 3 Encounters:  08/01/19 281 lb (127.5 kg)  07/31/19 280 lb (127 kg)  07/28/19 295 lb  6.7 oz (134 kg)     Studies/Labs Reviewed:   EKG:  EKG is not ordered today.    Recent Labs: 07/09/2019: B Natriuretic Peptide 281.0; TSH 1.908 07/13/2019: ALT 40 07/28/2019: BUN 8; Creatinine, Ser 0.74; Hemoglobin 11.8; Magnesium 1.9; Platelets 241; Potassium 3.9; Sodium 139   Lipid Panel No results found for: CHOL, TRIG, HDL, CHOLHDL, VLDL, LDLCALC, LDLDIRECT  Additional studies/ records that were reviewed today include:   Echocardiogram: 07/10/2019 IMPRESSIONS    1. Left ventricular ejection fraction, by estimation, is 70 to 75%. The  left ventricle has hyperdynamic function. The left ventricle has no  regional wall motion abnormalities. There is moderate left ventricular  hypertrophy. Left ventricular diastolic  parameters are indeterminate.  2. Right ventricular systolic function is normal. The right ventricular  size is normal. Tricuspid regurgitation signal is inadequate for assessing  PA pressure.  3. Right atrial size was upper normal.  4. The mitral valve is grossly normal. Trivial mitral valve  regurgitation.  5. The aortic valve is tricuspid. Aortic valve regurgitation is not  visualized.  6. The inferior vena cava is dilated in size with >50% respiratory  variability, suggesting right atrial pressure of 8 mmHg.    Assessment:    1. Persistent atrial fibrillation ([place])   2. Sinus pause   3. Essential hypertension   4. Morbid obesity ([place])      Plan:   In order of problems listed above:  1. Persistent Atrial Fibrillation - Diagnosed during her admission in 06/2019 and she underwent TEE guided cardioversion on 07/11/2019 with return to normal sinus rhythm. Required repeat DCCV on 07/12/2019 while in the ED. By review of notes with prior monitor strips mentioned, she has maintained NSR and is in NSR by examination today. She denies and recent palpitations. She is not on AV nodal blocking agents given her significant nocturnal pauses.  - She denies any  evidence of active bleeding. Remains on Eliquis 5mg  BID for anticoagulation.   2. Sinus Pauses - Her monitor recently showed a prolonged pause of up to 7.4 seconds which occurred while the patient was sleeping and she was asymptomatic with this. Evaluated by Dr. Rayann Heman and a sleep study was recommended given her nocturnal pauses but she was not felt to require PPM placement given no definitive daytime pauses and her asymptomatic state. She does have follow-up with Pulmnology scheduled for next week to arrange for an outpatient sleep study.   3. HTN - BP is at 134/66 during today's visit. Continue current  medication regimen with Lisinopril 20mg  daily.   4. Morbid Obesity - She has made dietary changes since her recent hospitalization by limiting her carbohydrates and has lost 8 pounds thus far. Congratulated on this with continued weight loss encouraged.    Medication Adjustments/Labs and Tests Ordered: Current medicines are reviewed at length with the patient today.  Concerns regarding medicines are outlined above.  Medication changes, Labs and Tests ordered today are listed in the Patient Instructions below. Patient Instructions  Medication Instructions:  Your physician recommends that you continue on your current medications as directed. Please refer to the Current Medication list given to you today.  *If you need a refill on your cardiac medications before your next appointment, please call your pharmacy*   Lab Work: NONE   If you have labs (blood work) drawn today and your tests are completely normal, you will receive your results only by: Marland Kitchen MyChart Message (if you have MyChart) OR . A paper copy in the mail If you have any lab test that is abnormal or we need to change your treatment, we will call you to review the results.   Testing/Procedures: NONE    Follow-Up: At [facility], you and your health needs are our priority.  As part of our continuing mission to provide you  with exceptional heart care, we have created designated Provider Care Teams.  These Care Teams include your primary Cardiologist (physician) and Advanced Practice Providers (APPs -  Physician Assistants and Nurse Practitioners) who all work together to provide you with the care you need, when you need it.  We recommend signing up for the patient portal called "MyChart".  Sign up information is provided on this After Visit Summary.  MyChart is used to connect with patients for Virtual Visits (Telemedicine).  Patients are able to view lab/test results, encounter notes, upcoming appointments, etc.  Non-urgent messages can be sent to your provider as well.   To learn more about what you can do with MyChart, go to [email or web address].    Your next appointment:   6 month(s)  The format for your next appointment:   In Person  Provider:   Rozann Lesches, MD   Other Instructions Thank you for choosing [place]!       Signed, Erma Heritage, PA-C  08/01/2019 8:33 PM    [place] [address] [place], [place] [address] Phone: [phone] Fax: [phone]

## 2019-08-01 NOTE — Patient Instructions (Signed)
Medication Instructions:  Your physician recommends that you continue on your current medications as directed. Please refer to the Current Medication list given to you today.  *If you need a refill on your cardiac medications before your next appointment, please call your pharmacy*   Lab Work: NONE   If you have labs (blood work) drawn today and your tests are completely normal, you will receive your results only by: . MyChart Message (if you have MyChart) OR . A paper copy in the mail If you have any lab test that is abnormal or we need to change your treatment, we will call you to review the results.   Testing/Procedures: NONE    Follow-Up: At [facility], you and your health needs are our priority.  As part of our continuing mission to provide you with exceptional heart care, we have created designated Provider Care Teams.  These Care Teams include your primary Cardiologist (physician) and Advanced Practice Providers (APPs -  Physician Assistants and Nurse Practitioners) who all work together to provide you with the care you need, when you need it.  We recommend signing up for the patient portal called "MyChart".  Sign up information is provided on this After Visit Summary.  MyChart is used to connect with patients for Virtual Visits (Telemedicine).  Patients are able to view lab/test results, encounter notes, upcoming appointments, etc.  Non-urgent messages can be sent to your provider as well.   To learn more about what you can do with MyChart, go to [email or web address].    Your next appointment:   6 month(s)  The format for your next appointment:   In Person  Provider:   Samuel McDowell, MD   Other Instructions Thank you for choosing [facility]!    

## 2019-08-05 ENCOUNTER — Other Ambulatory Visit: Payer: Self-pay

## 2019-08-05 ENCOUNTER — Encounter: Payer: Self-pay | Admitting: Pulmonary Disease

## 2019-08-05 ENCOUNTER — Ambulatory Visit (INDEPENDENT_AMBULATORY_CARE_PROVIDER_SITE_OTHER): Payer: Medicare Other | Admitting: Pulmonary Disease

## 2019-08-05 DIAGNOSIS — J4540 Moderate persistent asthma, uncomplicated: Secondary | ICD-10-CM | POA: Diagnosis not present

## 2019-08-05 DIAGNOSIS — J9602 Acute respiratory failure with hypercapnia: Secondary | ICD-10-CM | POA: Diagnosis not present

## 2019-08-05 NOTE — Progress Notes (Signed)
Virtual Visit via Telephone Note  I connected with Carolyn Silva on 08/05/19 at  3:30 PM EDT by telephone and verified that I am speaking with the correct person using two identifiers.  Location: Patient: Home Provider: [facility] - [address], [place], [place], [place] [address]   I discussed the limitations, risks, security and privacy concerns of performing an evaluation and management service by telephone and the availability of in person appointments. I also discussed with the patient that there may be a patient responsible charge related to this service. The patient expressed understanding and agreed to proceed.  Patient consented to consult via telephone: Yes People present and their role in pt care: Pt     History of Present Illness:  69 year old female never smoker initially consulted with our practice while inpatient at [facility] in May/2021 for acute on chronic hypercarbic respiratory failure.  Patient also with suspected asthma  Past medical history: A. fib, hypertension Smoking history: Never smoker Maintenance: None Patient of Dr. Halford Chessman   Chief complaint:    69 year old female never smoker initially consulted with our practice while inpatient at [facility] on 07/12/2019.  Discharged on 07/23/2019.  Discharge summary listed below:  PCP: Celene Squibb, MD Admit date: 07/12/2019 Discharge date: 07/23/2019 Admitted From:Home Disposition:  Home  Recommendations for Outpatient Follow-up:  1. Follow up with PCP in 1-2 weeks, follow-up blood pressure on lisinopril 2. Follow-up with [facility] pulmonology group in 1-2 weeks regarding outpatient sleep study evaluation 3. Continue to use home trilogy machine every night as prescribed 4. Follow-up with cardiology as scheduled.  Home Health: None Equipment/Devices: Trilogy machine Discharge Condition: Stable CODE STATUS: Full Diet recommendation: Heart Healthy  Brief/Interim  Summary: As per H&P written by Dr. Darrick Meigs on 07/13/19 69 y.o.female,with history of asthma, hypertension, newly diagnosed atrial fibrillation on anticoagulation with Eliquis was brought to the ED with complaints of tachycardia and respiratory difficulty. Patient was discharged from hospital on May 27 after she was diagnosed with new onset A. fib and underwent successful cardioversion. Patient was discharged on apixaban. Patient's daughter is a respiratory therapist and went on to check her mother today. Patient seem to have respiratory difficulty. She placed her on oxygen saturation monitor and noted the patient's was tachycardic with O2 sats decreased. In the ED patient was found to be in A. fib with RVR with heart rate 140s. Patient underwent electrical cardioversion in the ED and tolerated procedure well. However she was noted to have end-tidal CO2 in the 70s. Patient underwent propofol sedation. ABG showed PCO2 of 97. Patient was alert and oriented x3. She was placed on BiPAP for acute respiratory acidosis. At this time patient is on BiPAP, denies any chest pain. Denies nausea or vomiting. No history of abdominal pain or dysuria.  -Patient is basically noted to have chronic hypoxemic and hypercapnic respiratory failure in the setting of morbid obesity which is causing her thoracic restriction.  She has been seen by pulmonology and it took quite a while to determine how to best arrange for her to receive noninvasive ventilation at home.  She is now able to receive trilogy home ventilator and will have further sleep studies in the outpatient setting scheduled for her locally with pulmonology.  She has had no other significant health concerns throughout the course of her stay aside from her blood pressure being elevated for which she was placed on amlodipine.  This will be discontinued due to lower extremity edema  and she will remain on lisinopril as prescribed which she claims she was on  previously.  She will need to have her blood pressures followed up closely with her PCP Dr. Nevada Crane.  Discharge Diagnoses:  Active Problems:   Acute respiratory failure with hypercapnia (HCC)  Principal discharge diagnosis: Chronic hypoxemic/hypercapnic respiratory failure in the setting of morbid obesity causing thoracic restriction.  Atrial fibrillation with RVR status post cardioversion.  Patient reports she is doing well since being discharged from the hospital.  She reports uses the trilogy vent at night every night.  She also uses 2 L of oxygen.  She does not use any during the day.  She reports that her breathing is stable.  She feels that her breathing has significantly improved since starting the trilogy vent.  Observations/Objective:  CT angio chest 07/09/19 >> thoracic spondylosis TEE 07/11/19 >> EF 65 to 70%, mild LVH, small ASD with Lt to Rt shunt  SARS CoV2 PCR 5/28 >> negative   Social History   Tobacco Use  Smoking Status Never Smoker  Smokeless Tobacco Never Used   Immunization History  Administered Date(s) Administered  . Moderna SARS-COVID-2 Vaccination 05/03/2019, 06/04/2019    Assessment and Plan:  Acute respiratory failure with hypercapnia ([place]) Doing well since discharge  Plan: Split-night sleep study ordered Pulmonary function test ordered 2 to 8-month follow-up in [place] office   Follow Up Instructions:  Return in about 2 months (around 10/05/2019), or if symptoms worsen or fail to improve, for Follow up with Dr. Halford Chessman, [facility].   I discussed the assessment and treatment plan with the patient. The patient was provided an opportunity to ask questions and all were answered. The patient agreed with the plan and demonstrated an understanding of the instructions.   The patient was advised to call back or seek an in-person evaluation if the symptoms worsen or if the condition fails to improve as anticipated.  I provided 25 minutes of  non-face-to-face time during this encounter.   Lauraine Rinne, NP

## 2019-08-05 NOTE — Assessment & Plan Note (Signed)
Doing well since discharge  Plan: Split-night sleep study ordered Pulmonary function test ordered 2 to 78-month follow-up in [place] office

## 2019-08-05 NOTE — Patient Instructions (Addendum)
You were seen today by Lauraine Rinne, NP  for:   1. Acute respiratory failure with hypercapnia (HCC)  - Pulmonary function test; Future - Split night study; Future   We recommend today:  Orders Placed This Encounter  Procedures  . Pulmonary function test    Standing Status:   Future    Standing Expiration Date:   08/04/2020    Order Specific Question:   Where should this test be performed?    Answer:   [facility]    Order Specific Question:   Full PFT: includes the following: basic spirometry, spirometry pre & post bronchodilator, diffusion capacity (DLCO), lung volumes    Answer:   Full PFT  . Split night study    Standing Status:   Future    Standing Expiration Date:   02/04/2020    Order Specific Question:   Where should this test be performed:    Answer:   [facility]   Orders Placed This Encounter  Procedures  . Pulmonary function test  . Split night study   No orders of the defined types were placed in this encounter.   Follow Up:    Return in about 2 months (around 10/05/2019), or if symptoms worsen or fail to improve, for Follow up with Dr. Halford Chessman, [facility].   Please do your part to reduce the spread of COVID-19:      Reduce your risk of any infection  and COVID19 by using the similar precautions used for avoiding the common cold or flu:  Marland Kitchen Wash your hands often with soap and warm water for at least 20 seconds.  If soap and water are not readily available, use an alcohol-based hand sanitizer with at least 60% alcohol.  . If coughing or sneezing, cover your mouth and nose by coughing or sneezing into the elbow areas of your shirt or coat, into a tissue or into your sleeve (not your hands). Langley Gauss A MASK when in public  . Avoid shaking hands with others and consider head nods or verbal greetings only. . Avoid touching your eyes, nose, or mouth with unwashed hands.  . Avoid close contact with people who are sick. . Avoid places or  events with large numbers of people in one location, like concerts or sporting events. . If you have some symptoms but not all symptoms, continue to monitor at home and seek medical attention if your symptoms worsen. . If you are having a medical emergency, call 911.   [place] / e-Visit: [email or web address]         [facility]: [facility]: [phone]                   [facility]: [phone]     It is flu season:   >>> Best ways to protect herself from the flu: Receive the yearly flu vaccine, practice good hand hygiene washing with soap and also using hand sanitizer when available, eat a nutritious meals, get adequate rest, hydrate appropriately   Please contact the office if your symptoms worsen or you have concerns that you are not improving.   Thank you for choosing [facility] for your healthcare, and for allowing [place] to partner with you on your healthcare journey. I am thankful to be able to provide care to you today.   Wyn Quaker FNP-C

## 2019-08-07 ENCOUNTER — Other Ambulatory Visit: Payer: Self-pay

## 2019-08-07 ENCOUNTER — Other Ambulatory Visit (HOSPITAL_BASED_OUTPATIENT_CLINIC_OR_DEPARTMENT_OTHER): Payer: Self-pay

## 2019-08-07 ENCOUNTER — Ambulatory Visit (HOSPITAL_COMMUNITY)
Admit: 2019-08-07 | Discharge: 2019-08-07 | Disposition: A | Discharge status: Home / Self Care | Payer: Medicare Other | Attending: Internal Medicine | Admitting: Internal Medicine

## 2019-08-07 ENCOUNTER — Ambulatory Visit (HOSPITAL_COMMUNITY)
Admission: RE | Admit: 2019-08-07 | Discharge: 2019-08-07 | Disposition: A | Discharge status: Home / Self Care | Payer: Medicare Other | Source: Ambulatory Visit | Attending: Internal Medicine | Admitting: Internal Medicine

## 2019-08-07 DIAGNOSIS — M85852 Other specified disorders of bone density and structure, left thigh: Secondary | ICD-10-CM | POA: Diagnosis not present

## 2019-08-07 DIAGNOSIS — Z13820 Encounter for screening for osteoporosis: Secondary | ICD-10-CM

## 2019-08-07 DIAGNOSIS — Z1231 Encounter for screening mammogram for malignant neoplasm of breast: Secondary | ICD-10-CM | POA: Insufficient documentation

## 2019-08-07 DIAGNOSIS — Z780 Asymptomatic menopausal state: Secondary | ICD-10-CM | POA: Diagnosis not present

## 2019-08-09 ENCOUNTER — Ambulatory Visit: Payer: Medicare Other | Admitting: Cardiology

## 2019-08-11 DIAGNOSIS — J4521 Mild intermittent asthma with (acute) exacerbation: Secondary | ICD-10-CM | POA: Diagnosis not present

## 2019-08-11 DIAGNOSIS — Z825 Family history of asthma and other chronic lower respiratory diseases: Secondary | ICD-10-CM | POA: Diagnosis not present

## 2019-08-12 DIAGNOSIS — E559 Vitamin D deficiency, unspecified: Secondary | ICD-10-CM | POA: Diagnosis not present

## 2019-08-12 DIAGNOSIS — Z0000 Encounter for general adult medical examination without abnormal findings: Secondary | ICD-10-CM | POA: Diagnosis not present

## 2019-08-12 DIAGNOSIS — Z9981 Dependence on supplemental oxygen: Secondary | ICD-10-CM | POA: Diagnosis not present

## 2019-08-12 DIAGNOSIS — J9611 Chronic respiratory failure with hypoxia: Secondary | ICD-10-CM | POA: Diagnosis not present

## 2019-08-12 DIAGNOSIS — R809 Proteinuria, unspecified: Secondary | ICD-10-CM | POA: Diagnosis not present

## 2019-08-12 NOTE — Progress Notes (Signed)
Reviewed and agree with assessment/plan.   Sorin Frimpong, MD [facility] 08/12/2019, 7:06 AM Pager:  [phone]  

## 2019-08-13 ENCOUNTER — Other Ambulatory Visit: Payer: Self-pay | Admitting: Pulmonary Disease

## 2019-08-13 DIAGNOSIS — I480 Paroxysmal atrial fibrillation: Secondary | ICD-10-CM | POA: Diagnosis not present

## 2019-08-13 DIAGNOSIS — I10 Essential (primary) hypertension: Secondary | ICD-10-CM | POA: Diagnosis not present

## 2019-08-13 DIAGNOSIS — J4540 Moderate persistent asthma, uncomplicated: Secondary | ICD-10-CM

## 2019-08-14 DIAGNOSIS — J45909 Unspecified asthma, uncomplicated: Secondary | ICD-10-CM | POA: Diagnosis not present

## 2019-08-14 DIAGNOSIS — G4733 Obstructive sleep apnea (adult) (pediatric): Secondary | ICD-10-CM | POA: Diagnosis not present

## 2019-08-14 DIAGNOSIS — Z9181 History of falling: Secondary | ICD-10-CM | POA: Diagnosis not present

## 2019-08-14 DIAGNOSIS — J9612 Chronic respiratory failure with hypercapnia: Secondary | ICD-10-CM | POA: Diagnosis not present

## 2019-08-14 DIAGNOSIS — I10 Essential (primary) hypertension: Secondary | ICD-10-CM | POA: Diagnosis not present

## 2019-08-14 DIAGNOSIS — Z9981 Dependence on supplemental oxygen: Secondary | ICD-10-CM | POA: Diagnosis not present

## 2019-08-14 DIAGNOSIS — I4891 Unspecified atrial fibrillation: Secondary | ICD-10-CM | POA: Diagnosis not present

## 2019-08-14 DIAGNOSIS — J9611 Chronic respiratory failure with hypoxia: Secondary | ICD-10-CM | POA: Diagnosis not present

## 2019-08-14 DIAGNOSIS — K219 Gastro-esophageal reflux disease without esophagitis: Secondary | ICD-10-CM | POA: Diagnosis not present

## 2019-08-14 DIAGNOSIS — Z7901 Long term (current) use of anticoagulants: Secondary | ICD-10-CM | POA: Diagnosis not present

## 2019-08-15 ENCOUNTER — Ambulatory Visit (HOSPITAL_COMMUNITY)
Admission: RE | Admit: 2019-08-15 | Discharge: 2019-08-15 | Disposition: A | Discharge status: Home / Self Care | Payer: Medicare Other | Source: Ambulatory Visit | Attending: Internal Medicine | Admitting: Internal Medicine

## 2019-08-15 ENCOUNTER — Other Ambulatory Visit: Payer: Self-pay

## 2019-08-15 DIAGNOSIS — Z1231 Encounter for screening mammogram for malignant neoplasm of breast: Secondary | ICD-10-CM | POA: Insufficient documentation

## 2019-08-21 ENCOUNTER — Other Ambulatory Visit (HOSPITAL_COMMUNITY): Payer: Medicare Other

## 2019-08-21 ENCOUNTER — Other Ambulatory Visit: Payer: Self-pay

## 2019-08-21 DIAGNOSIS — I455 Other specified heart block: Secondary | ICD-10-CM

## 2019-08-21 DIAGNOSIS — I4819 Other persistent atrial fibrillation: Secondary | ICD-10-CM

## 2019-08-23 ENCOUNTER — Ambulatory Visit: Payer: Medicare Other | Attending: Pulmonary Disease | Admitting: Pulmonary Disease

## 2019-08-23 ENCOUNTER — Other Ambulatory Visit: Payer: Self-pay

## 2019-08-23 DIAGNOSIS — I4891 Unspecified atrial fibrillation: Secondary | ICD-10-CM | POA: Insufficient documentation

## 2019-08-23 DIAGNOSIS — J9600 Acute respiratory failure, unspecified whether with hypoxia or hypercapnia: Secondary | ICD-10-CM | POA: Diagnosis not present

## 2019-08-23 DIAGNOSIS — G4733 Obstructive sleep apnea (adult) (pediatric): Secondary | ICD-10-CM | POA: Diagnosis not present

## 2019-08-23 DIAGNOSIS — J9602 Acute respiratory failure with hypercapnia: Secondary | ICD-10-CM | POA: Insufficient documentation

## 2019-08-27 DIAGNOSIS — I480 Paroxysmal atrial fibrillation: Secondary | ICD-10-CM | POA: Diagnosis not present

## 2019-08-27 DIAGNOSIS — I10 Essential (primary) hypertension: Secondary | ICD-10-CM | POA: Diagnosis not present

## 2019-08-27 DIAGNOSIS — M8588 Other specified disorders of bone density and structure, other site: Secondary | ICD-10-CM | POA: Diagnosis not present

## 2019-08-27 DIAGNOSIS — E559 Vitamin D deficiency, unspecified: Secondary | ICD-10-CM | POA: Diagnosis not present

## 2019-08-29 ENCOUNTER — Telehealth: Payer: Self-pay | Admitting: Pulmonary Disease

## 2019-08-29 NOTE — Telephone Encounter (Signed)
PSG 08/23/19 >> AHI 28.4, SpO2 low 72%.  Bipap 19/15 cm H2O, no REM sleep.   Please inform her that her sleep study shows moderate obstructive sleep apnea.  She should continue using Trilogy home vent at night.  Will discuss in more detail at [facility] on 10/04/19.

## 2019-08-29 NOTE — Telephone Encounter (Signed)
Spoke with the pt and notified of recs per Dr. Halford Chessman. Nothing further needed.

## 2019-08-29 NOTE — Procedures (Signed)
    Patient Name: Carolyn, Silva Date: 08/23/2019 Gender: Female D.O.B: 1950-12-27 Age (years): 82 Referring Provider: Lauraine Rinne NP Height (inches): 68 Interpreting Physician: Chesley Mires MD, ABSM Weight (lbs): 281 RPSGT: Peak, Robert BMI: 45 MRN: [id number] Neck Size: 18.50  CLINICAL INFORMATION The patient is referred for a split night study with BPAP.  MEDICATIONS Medications self-administered by patient taken the night of the study : N/A  SLEEP STUDY TECHNIQUE As per the AASM Manual for the Scoring of Sleep and Associated Events v2.3 (April 2016) with a hypopnea requiring 4% desaturations.  The channels recorded and monitored were frontal, central and occipital EEG, electrooculogram (EOG), submentalis EMG (chin), nasal and oral airflow, thoracic and abdominal wall motion, anterior tibialis EMG, snore microphone, electrocardiogram, and pulse oximetry. Bi-level positive airway pressure (BiPAP) was initiated when the patient met split night criteria and was titrated according to treat sleep-disordered breathing.  RESPIRATORY PARAMETERS Diagnostic  Total AHI (/hr): 28.4 RDI (/hr): 38.2 OA Index (/hr): 7 CA Index (/hr): 0.0 REM AHI (/hr): 77.8 NREM AHI (/hr): 21.6 Supine AHI (/hr): 28.4 Non-supine AHI (/hr): N/A Min O2 Sat (%): 72.0 Mean O2 (%): 90.1 Time below 88% (min): 26.7   Titration  Optimal IPAP Pressure (cm): 19 Optimal EPAP Pressure (cm): 15 AHI at Optimal Pressure (/hr): 0 Min O2 at Optimal Pressure (%): 90.0 Sleep % at Optimal (%): N/A Supine % at Optimal (%): N/A   SLEEP ARCHITECTURE The study was initiated at 10:28:40 PM and terminated at 5:23:37 AM. The total recorded time was 414.9 minutes. EEG confirmed total sleep time was 320.5 minutes yielding a sleep efficiency of 77.2%%. Sleep onset after lights out was 11.4 minutes with a REM latency of 59.5 minutes. The patient spent 6.9%% of the night in stage N1 sleep, 68.3%% in stage N2 sleep, 0.0%% in  stage N3 and 24.8% in REM. Wake after sleep onset (WASO) was 83.0 minutes. The Arousal Index was 24.0/hour.  LEG MOVEMENT DATA The total Periodic Limb Movements of Sleep (PLMS) were 0. The PLMS index was 0.0 .  CARDIAC DATA The 2 lead EKG demonstrated atrial fibrillation. The mean heart rate was 100.0 beats per minute. Other EKG findings include: None.  IMPRESSIONS - Moderate obstructive sleep apnea with an AHI of 28.4 and SpO2 low of 72%. - She did best with Bipap 19/15 cm H2O.  However, she was not observed in REM sleep at this pressure setting. - She did not require the use of supplemental oxygen during this study. - ECG showed atrial fibrillation.  She has a history of atrial fibrillation.  DIAGNOSIS - Obstructive Sleep Apnea (G47.33)  RECOMMENDATIONS - Follow up in sleep medicine clinic scheduled for October 04, 2019.  [Electronically signed] 08/29/2019 10:30 AM  Chesley Mires MD, ABSM Diplomate, American Board of Sleep Medicine   NPI: [id number]

## 2019-09-10 DIAGNOSIS — Z825 Family history of asthma and other chronic lower respiratory diseases: Secondary | ICD-10-CM | POA: Diagnosis not present

## 2019-09-10 DIAGNOSIS — J4521 Mild intermittent asthma with (acute) exacerbation: Secondary | ICD-10-CM | POA: Diagnosis not present

## 2019-09-23 DIAGNOSIS — J9600 Acute respiratory failure, unspecified whether with hypoxia or hypercapnia: Secondary | ICD-10-CM | POA: Diagnosis not present

## 2019-09-27 ENCOUNTER — Other Ambulatory Visit (HOSPITAL_COMMUNITY)
Admission: RE | Admit: 2019-09-27 | Discharge: 2019-09-27 | Disposition: A | Discharge status: Home / Self Care | Payer: Medicare Other | Source: Ambulatory Visit | Attending: Pulmonary Disease | Admitting: Pulmonary Disease

## 2019-09-27 ENCOUNTER — Other Ambulatory Visit: Payer: Self-pay

## 2019-09-27 DIAGNOSIS — Z01812 Encounter for preprocedural laboratory examination: Secondary | ICD-10-CM | POA: Insufficient documentation

## 2019-09-27 DIAGNOSIS — Z20822 Contact with and (suspected) exposure to covid-19: Secondary | ICD-10-CM | POA: Insufficient documentation

## 2019-09-28 LAB — SARS CORONAVIRUS 2 (TAT 6-24 HRS): SARS Coronavirus 2: NEGATIVE

## 2019-10-01 ENCOUNTER — Ambulatory Visit (HOSPITAL_COMMUNITY)
Admission: RE | Admit: 2019-10-01 | Discharge: 2019-10-01 | Disposition: A | Discharge status: Home / Self Care | Payer: Medicare Other | Source: Ambulatory Visit | Attending: Pulmonary Disease | Admitting: Pulmonary Disease

## 2019-10-01 ENCOUNTER — Other Ambulatory Visit: Payer: Self-pay

## 2019-10-01 DIAGNOSIS — J4540 Moderate persistent asthma, uncomplicated: Secondary | ICD-10-CM | POA: Insufficient documentation

## 2019-10-01 LAB — PULMONARY FUNCTION TEST
DL/VA % pred: 96 %
DL/VA: 3.96 ml/min/mmHg/L
DLCO unc % pred: 58 %
DLCO unc: 12.2 ml/min/mmHg
FEF 25-75 Post: 1.25 L/sec
FEF 25-75 Pre: 0.94 L/sec
FEF2575-%Change-Post: 32 %
FEF2575-%Pred-Post: 65 %
FEF2575-%Pred-Pre: 49 %
FEV1-%Change-Post: 5 %
FEV1-%Pred-Post: 57 %
FEV1-%Pred-Pre: 54 %
FEV1-Post: 1.19 L
FEV1-Pre: 1.13 L
FEV1FVC-%Change-Post: 9 %
FEV1FVC-%Pred-Pre: 99 %
FEV6-%Change-Post: -2 %
FEV6-%Pred-Post: 54 %
FEV6-%Pred-Pre: 56 %
FEV6-Post: 1.4 L
FEV6-Pre: 1.44 L
FEV6FVC-%Change-Post: 1 %
FEV6FVC-%Pred-Post: 103 %
FEV6FVC-%Pred-Pre: 102 %
FVC-%Change-Post: -4 %
FVC-%Pred-Post: 52 %
FVC-%Pred-Pre: 54 %
FVC-Post: 1.4 L
FVC-Pre: 1.46 L
Post FEV1/FVC ratio: 85 %
Post FEV6/FVC ratio: 100 %
Pre FEV1/FVC ratio: 78 %
Pre FEV6/FVC Ratio: 99 %
RV % pred: 100 %
RV: 2.26 L
TLC % pred: 72 %
TLC: 3.9 L

## 2019-10-01 MED ORDER — ALBUTEROL SULFATE (2.5 MG/3ML) 0.083% IN NEBU
2.5000 mg | INHALATION_SOLUTION | Freq: Once | RESPIRATORY_TRACT | Status: AC
  Administered 2019-10-01: 2.5 mg via RESPIRATORY_TRACT

## 2019-10-04 ENCOUNTER — Other Ambulatory Visit: Payer: Self-pay

## 2019-10-04 ENCOUNTER — Ambulatory Visit (INDEPENDENT_AMBULATORY_CARE_PROVIDER_SITE_OTHER): Payer: Medicare Other | Admitting: Pulmonary Disease

## 2019-10-04 ENCOUNTER — Encounter: Payer: Self-pay | Admitting: Pulmonary Disease

## 2019-10-04 VITALS — BP 110/50 | HR 88 | Temp 97.0°F | Ht 66.0 in | Wt 279.0 lb

## 2019-10-04 DIAGNOSIS — J9612 Chronic respiratory failure with hypercapnia: Secondary | ICD-10-CM

## 2019-10-04 DIAGNOSIS — J9611 Chronic respiratory failure with hypoxia: Secondary | ICD-10-CM | POA: Diagnosis not present

## 2019-10-04 DIAGNOSIS — G4733 Obstructive sleep apnea (adult) (pediatric): Secondary | ICD-10-CM | POA: Diagnosis not present

## 2019-10-04 DIAGNOSIS — E662 Morbid (severe) obesity with alveolar hypoventilation: Secondary | ICD-10-CM

## 2019-10-04 NOTE — Patient Instructions (Signed)
Will get copy of your Trilogy report and call with results  Will arrange for overnight oxygen test with Trilogy and 2 liters oxygen  Follow up in 6 months

## 2019-10-04 NOTE — Progress Notes (Signed)
[facility]  Chief Complaint  Patient presents with  . Follow-up    Patient is doing good since last visit. No concerns at this time    Constitutional:  BP (!) 110/50 (BP Location: Left Arm, Patient Position: Sitting, Cuff Size: Normal)   Pulse 88   Temp (!) 97 F (36.1 C) (Temporal)   Ht 5\' 6"  (1.676 m)   Wt 279 lb (126.6 kg)   SpO2 92%   BMI 45.03 kg/m   Past Medical History:  HTN, A fib  Summary:  Carolyn Silva is a 69 y.o. female with dyspnea in setting of asthma and chronic hypoxic/hypercapnic respiratory failure  Subjective:   She had sleep study in July.  Showed moderate to severe OSA.    Had PFT in August.  Showed mild restriction, and moderate diffusion defect that corrects for lung volumes.  She uses Trilogy with 2 liters at night.  Sleeps well.  Her mask leaks sometimes, and she will sometimes feel like pressure is too high from machine.  She is not having cough, wheeze, sputum, or chest congestion.  Doesn't need to use albuterol.   Physical Exam:   Appearance - well kempt   ENMT - no sinus tenderness, no oral exudate, no LAN, Mallampati 3 airway, no stridor  Respiratory - equal breath sounds bilaterally, no wheezing or rales  CV - s1s2 regular rate and rhythm, no murmurs  Ext - no clubbing, no edema  Skin - no rashes  Psych - normal mood and affect  Assessment/Plan:   Chronic hypoxic/hypercapnic respiratory failure. - from obstructive sleep apnea and obesity hypoventilation syndrome - she reports compliance with Trilogy home vent and 2 liters oxygen at night - will get copy of her Trilogy report and determine if her pressure setting can be dialed down since she is having mask leak and feels too much pressure at times - will arrange for ONO with Trilogy and 2 liters and then determine if she needs adjustment to her oxygen settings  Mild, intermittent asthma. - prn albuterol  Obesity. - discussed  importance of weight loss  A total of 22 minutes spent addressing patient care issues on day of visit.  Follow up:  Patient Instructions  Will get copy of your Trilogy report and call with results  Will arrange for overnight oxygen test with Trilogy and 2 liters oxygen  Follow up in 6 months   Signature:  Chesley Mires, MD [place] Pager: [phone] 10/04/2019, 11:07 AM  Flow Sheet     Pulmonary tests:   ABG on RA 07/16/19 >> pH 7.42, PCO2 58.8, PO2 57.7  PFT 10/01/19 >> FEV1 1.19 (57%), FEV1% 85, TLC 3.90 (72%), DLCO 58%  Chest imaging:   CT angio chest 07/09/19 >> thoracic spondylosis  Sleep testing:   PSG 08/23/19 >> AHI 28.4, SpO2 low 72%.  Bipap 19/15 cm H2O, no REM sleep.  Cardiac tests:   TEE 07/11/19 >> EF 65 to 70%, mild LVH, small ASD with Lt to Rt shunt  Medications:   Allergies as of 10/04/2019      Reactions   Strawberry C [ascorbic Acid] Hives      Medication List       Accurate as of October 04, 2019 11:07 AM. If you have any questions, ask your nurse or doctor.        albuterol 108 (90 Base) MCG/ACT inhaler Commonly known as: VENTOLIN HFA Inhale 1-2 puffs into the lungs every 6 (  six) hours as needed for wheezing or shortness of breath.   Eliquis 5 MG Tabs tablet Generic drug: apixaban Take 1 tablet (5 mg total) by mouth 2 (two) times daily.   lisinopril 10 MG tablet Commonly known as: ZESTRIL Take 10 mg by mouth in the morning and at bedtime.       Past Surgical History:  She  has a past surgical history that includes Tubal ligation; Colonoscopy (N/A, 09/14/2017); Cardioversion (N/A, 07/11/2019); and TEE without cardioversion (N/A, 07/11/2019).  Family History:  Her family history includes Cirrhosis in her father; Diabetes in her brother and mother; Kidney disease in her sister.  Social History:  She  reports that she has never smoked. She has never used smokeless tobacco. She reports current alcohol use. She  reports that she does not use drugs.

## 2019-10-11 DIAGNOSIS — Z825 Family history of asthma and other chronic lower respiratory diseases: Secondary | ICD-10-CM | POA: Diagnosis not present

## 2019-10-11 DIAGNOSIS — J4521 Mild intermittent asthma with (acute) exacerbation: Secondary | ICD-10-CM | POA: Diagnosis not present

## 2019-10-17 DIAGNOSIS — G4733 Obstructive sleep apnea (adult) (pediatric): Secondary | ICD-10-CM | POA: Diagnosis not present

## 2019-10-24 DIAGNOSIS — J9600 Acute respiratory failure, unspecified whether with hypoxia or hypercapnia: Secondary | ICD-10-CM | POA: Diagnosis not present

## 2019-10-29 DIAGNOSIS — G4730 Sleep apnea, unspecified: Secondary | ICD-10-CM | POA: Diagnosis not present

## 2019-10-29 DIAGNOSIS — R0683 Snoring: Secondary | ICD-10-CM | POA: Diagnosis not present

## 2019-11-07 DIAGNOSIS — Z9981 Dependence on supplemental oxygen: Secondary | ICD-10-CM | POA: Diagnosis not present

## 2019-11-07 DIAGNOSIS — J9611 Chronic respiratory failure with hypoxia: Secondary | ICD-10-CM | POA: Diagnosis not present

## 2019-11-07 DIAGNOSIS — J9612 Chronic respiratory failure with hypercapnia: Secondary | ICD-10-CM | POA: Diagnosis not present

## 2019-11-07 DIAGNOSIS — G4733 Obstructive sleep apnea (adult) (pediatric): Secondary | ICD-10-CM | POA: Diagnosis not present

## 2019-11-07 DIAGNOSIS — R7303 Prediabetes: Secondary | ICD-10-CM | POA: Diagnosis not present

## 2019-11-11 DIAGNOSIS — J4521 Mild intermittent asthma with (acute) exacerbation: Secondary | ICD-10-CM | POA: Diagnosis not present

## 2019-11-11 DIAGNOSIS — D509 Iron deficiency anemia, unspecified: Secondary | ICD-10-CM | POA: Diagnosis not present

## 2019-11-11 DIAGNOSIS — R7303 Prediabetes: Secondary | ICD-10-CM | POA: Diagnosis not present

## 2019-11-11 DIAGNOSIS — Z825 Family history of asthma and other chronic lower respiratory diseases: Secondary | ICD-10-CM | POA: Diagnosis not present

## 2019-11-12 ENCOUNTER — Telehealth: Payer: Self-pay | Admitting: Internal Medicine

## 2019-11-12 NOTE — Telephone Encounter (Signed)
Patient states her PCP would like to make Dr. Rayann Heman aware that her hemoglobin went from 9.6 to 11.9 and her hematocrit went from 30.8 to 36.9. Patient is requesting a call back to discuss.

## 2019-11-12 NOTE — Telephone Encounter (Signed)
Spoke to the patient and confirmed labs. Will forward to Dr. Rayann Heman.

## 2019-11-23 DIAGNOSIS — J9600 Acute respiratory failure, unspecified whether with hypoxia or hypercapnia: Secondary | ICD-10-CM | POA: Diagnosis not present

## 2019-12-11 DIAGNOSIS — J4521 Mild intermittent asthma with (acute) exacerbation: Secondary | ICD-10-CM | POA: Diagnosis not present

## 2019-12-11 DIAGNOSIS — Z825 Family history of asthma and other chronic lower respiratory diseases: Secondary | ICD-10-CM | POA: Diagnosis not present

## 2019-12-24 DIAGNOSIS — J9600 Acute respiratory failure, unspecified whether with hypoxia or hypercapnia: Secondary | ICD-10-CM | POA: Diagnosis not present

## 2020-01-11 DIAGNOSIS — J4521 Mild intermittent asthma with (acute) exacerbation: Secondary | ICD-10-CM | POA: Diagnosis not present

## 2020-01-11 DIAGNOSIS — Z825 Family history of asthma and other chronic lower respiratory diseases: Secondary | ICD-10-CM | POA: Diagnosis not present

## 2020-01-23 DIAGNOSIS — J9600 Acute respiratory failure, unspecified whether with hypoxia or hypercapnia: Secondary | ICD-10-CM | POA: Diagnosis not present

## 2020-02-04 ENCOUNTER — Ambulatory Visit: Payer: Medicare Other | Admitting: Cardiology

## 2020-02-04 NOTE — Progress Notes (Deleted)
Cardiology Office Note  Date: 02/04/2020   ID: Carolyn Silva, DOB July 26, 1950, MRN [id number]  PCP:  Celene Squibb, MD  Cardiologist:  Rozann Lesches, MD Electrophysiologist:  None   No chief complaint on file.   History of Present Illness: Carolyn Silva is a 69 y.o. female in June by Ms. Strader PA-C.  She is following with Pulmonary, saw Dr. Halford Chessman back in August with OSA/OHS currently under treatment.  Past Medical History:  Diagnosis Date  . Asthma   . Hypertension   . Paroxysmal atrial fibrillation [facility])     Past Surgical History:  Procedure Laterality Date  . CARDIOVERSION N/A 07/11/2019   Procedure: CARDIOVERSION;  Surgeon: Satira Sark, MD;  Location: [facility] ORS;  Service: Cardiovascular;  Laterality: N/A;  . COLONOSCOPY N/A 09/14/2017   Procedure: COLONOSCOPY;  Surgeon: Danie Binder, MD;  Location: [facility] ENDO SUITE;  Service: Endoscopy;  Laterality: N/A;  2:00  . TEE WITHOUT CARDIOVERSION N/A 07/11/2019   Procedure: TRANSESOPHAGEAL ECHOCARDIOGRAM (TEE) WITH PROPOFOL;  Surgeon: Satira Sark, MD;  Location: [facility] ORS;  Service: Cardiovascular;  Laterality: N/A;  . TUBAL LIGATION      Current Outpatient Medications  Medication Sig Dispense Refill  . albuterol (VENTOLIN HFA) 108 (90 Base) MCG/ACT inhaler Inhale 1-2 puffs into the lungs every 6 (six) hours as needed for wheezing or shortness of breath. 18 g 2  . ELIQUIS 5 MG TABS tablet Take 1 tablet (5 mg total) by mouth 2 (two) times daily. 60 tablet 11  . lisinopril (ZESTRIL) 10 MG tablet Take 10 mg by mouth in the morning and at bedtime.     No current facility-administered medications for this visit.   Allergies:  Strawberry c [ascorbic acid]   Social History: The patient  reports that she has never smoked. She has never used smokeless tobacco. She reports current alcohol use. She reports that she does not use drugs.   Family History: The patient's family history includes Cirrhosis in her father;  Diabetes in her brother and mother; Kidney disease in her sister.   ROS:  Please see the history of present illness. Otherwise, complete review of systems is positive for {NONE DEFAULTED:18576::"none"}.  All other systems are reviewed and negative.   Physical Exam: VS:  There were no vitals taken for this visit., BMI There is no height or weight on file to calculate BMI.  Wt Readings from Last 3 Encounters:  10/04/19 279 lb (126.6 kg)  08/01/19 281 lb (127.5 kg)  07/31/19 280 lb (127 kg)    General: Patient appears comfortable at rest. HEENT: Conjunctiva and lids normal, oropharynx clear with moist mucosa. Neck: Supple, no elevated JVP or carotid bruits, no thyromegaly. Lungs: Clear to auscultation, nonlabored breathing at rest. Cardiac: Regular rate and rhythm, no S3 or significant systolic murmur, no pericardial rub. Abdomen: Soft, nontender, no hepatomegaly, bowel sounds present, no guarding or rebound. Extremities: No pitting edema, distal pulses 2+. Skin: Warm and dry. Musculoskeletal: No kyphosis. Neuropsychiatric: Alert and oriented x3, affect grossly appropriate.  ECG:  An ECG dated 07/28/2019 was personally reviewed today and demonstrated:  Sinus rhythm with leftward axis, nonspecific ST changes.  Recent Labwork: 07/09/2019: B Natriuretic Peptide 281.0; TSH 1.908 07/13/2019: ALT 40; AST 29 07/28/2019: BUN 8; Creatinine, Ser 0.74; Hemoglobin 11.8; Magnesium 1.9; Platelets 241; Potassium 3.9; Sodium 139   Other Studies Reviewed Today:  Cardiac monitor July 2021: ZIO AT reviewed.  13 days 23 hours analyzed.  Predominant rhythm is  sinus with heart rate ranging from 38 bpm up to 136 bpm and average heart rate 73 bpm.  There were rare PACs representing less than 1% of total beats.  Occasional PVCs noted representing 2.8% of total beats.  NSVT observed, longest episode was 5 beats.  Also PSVT noted, longest episode lasted for 25 seconds at a heart rate average of 158 bpm.  There were  also multiple pauses observed, the longest of which lasted 7.4 seconds.  Episodes of Wenkebach conduction also observed.  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  TEE 07/11/2019: 1. Left ventricular ejection fraction, by estimation, is 65 to 70%. The  left ventricle has normal function. The left ventricle has no regional  wall motion abnormalities. There is mild left ventricular hypertrophy.  2. Right ventricular systolic function is normal. The right ventricular  size is normal.  3. No left atrial/left atrial appendage thrombus was detected. The LAA  emptying velocity was 40 cm/s.  4. The mitral valve is grossly normal. Trivial mitral valve  regurgitation.  5. The aortic valve is tricuspid. Aortic valve regurgitation is not  visualized.  6. Evidence of atrial level shunting detected by color flow Doppler.  There is a small atrial septal defect with predominantly left to right  shunting across the atrial septum.   Assessment and Plan:   Medication Adjustments/Labs and Tests Ordered: Current medicines are reviewed at length with the patient today.  Concerns regarding medicines are outlined above.   Tests Ordered: No orders of the defined types were placed in this encounter.   Medication Changes: No orders of the defined types were placed in this encounter.   Disposition:  Follow up {follow up:15908}  Signed, Satira Sark, MD, [facility] 02/04/2020 10:35 AM    [facility] [address], [place], [place] [address] Phone: [phone]; Fax: [phone]

## 2020-02-10 DIAGNOSIS — Z825 Family history of asthma and other chronic lower respiratory diseases: Secondary | ICD-10-CM | POA: Diagnosis not present

## 2020-02-10 DIAGNOSIS — J4521 Mild intermittent asthma with (acute) exacerbation: Secondary | ICD-10-CM | POA: Diagnosis not present

## 2020-02-23 DIAGNOSIS — J9600 Acute respiratory failure, unspecified whether with hypoxia or hypercapnia: Secondary | ICD-10-CM | POA: Diagnosis not present

## 2020-03-10 DIAGNOSIS — R7303 Prediabetes: Secondary | ICD-10-CM | POA: Diagnosis not present

## 2020-03-10 DIAGNOSIS — J9612 Chronic respiratory failure with hypercapnia: Secondary | ICD-10-CM | POA: Diagnosis not present

## 2020-03-10 DIAGNOSIS — G4733 Obstructive sleep apnea (adult) (pediatric): Secondary | ICD-10-CM | POA: Diagnosis not present

## 2020-03-10 DIAGNOSIS — I4891 Unspecified atrial fibrillation: Secondary | ICD-10-CM | POA: Diagnosis not present

## 2020-03-10 DIAGNOSIS — J9611 Chronic respiratory failure with hypoxia: Secondary | ICD-10-CM | POA: Diagnosis not present

## 2020-03-10 DIAGNOSIS — D509 Iron deficiency anemia, unspecified: Secondary | ICD-10-CM | POA: Diagnosis not present

## 2020-03-10 DIAGNOSIS — Z9981 Dependence on supplemental oxygen: Secondary | ICD-10-CM | POA: Diagnosis not present

## 2020-03-12 DIAGNOSIS — J4521 Mild intermittent asthma with (acute) exacerbation: Secondary | ICD-10-CM | POA: Diagnosis not present

## 2020-03-12 DIAGNOSIS — Z825 Family history of asthma and other chronic lower respiratory diseases: Secondary | ICD-10-CM | POA: Diagnosis not present

## 2020-03-16 DIAGNOSIS — Z23 Encounter for immunization: Secondary | ICD-10-CM | POA: Diagnosis not present

## 2020-03-16 DIAGNOSIS — E785 Hyperlipidemia, unspecified: Secondary | ICD-10-CM | POA: Diagnosis not present

## 2020-03-16 DIAGNOSIS — R7303 Prediabetes: Secondary | ICD-10-CM | POA: Diagnosis not present

## 2020-03-16 DIAGNOSIS — I480 Paroxysmal atrial fibrillation: Secondary | ICD-10-CM | POA: Diagnosis not present

## 2020-03-16 DIAGNOSIS — D509 Iron deficiency anemia, unspecified: Secondary | ICD-10-CM | POA: Diagnosis not present

## 2020-03-25 DIAGNOSIS — J9600 Acute respiratory failure, unspecified whether with hypoxia or hypercapnia: Secondary | ICD-10-CM | POA: Diagnosis not present

## 2020-04-12 DIAGNOSIS — J4521 Mild intermittent asthma with (acute) exacerbation: Secondary | ICD-10-CM | POA: Diagnosis not present

## 2020-04-12 DIAGNOSIS — Z825 Family history of asthma and other chronic lower respiratory diseases: Secondary | ICD-10-CM | POA: Diagnosis not present

## 2020-04-14 ENCOUNTER — Other Ambulatory Visit: Payer: Self-pay | Admitting: Student

## 2020-04-22 DIAGNOSIS — J9600 Acute respiratory failure, unspecified whether with hypoxia or hypercapnia: Secondary | ICD-10-CM | POA: Diagnosis not present

## 2020-05-10 DIAGNOSIS — Z825 Family history of asthma and other chronic lower respiratory diseases: Secondary | ICD-10-CM | POA: Diagnosis not present

## 2020-05-10 DIAGNOSIS — J4521 Mild intermittent asthma with (acute) exacerbation: Secondary | ICD-10-CM | POA: Diagnosis not present

## 2020-05-23 DIAGNOSIS — J9600 Acute respiratory failure, unspecified whether with hypoxia or hypercapnia: Secondary | ICD-10-CM | POA: Diagnosis not present

## 2020-06-09 NOTE — Progress Notes (Signed)
Cardiology Office Note    Date:  06/22/2020   ID:  Carolyn Silva, DOB [birth year], MRN [id number]   PCP:  Celene Squibb, [place]  Cardiologist:  Rozann Lesches, MD  Advanced Practice Provider:  No care team member to display Electrophysiologist:  None   [phone]   No chief complaint on file.   History of Present Illness:  Carolyn Silva is a 70 y.o. female persistent atrial fibrillation (s/o DCCV on 07/11/2019), HTN, morbid obesity and asthma    She was recently admitted to [facility] from 5/25 - 07/11/2019 for in atrial fibrillation which was a new diagnosis for her. CTA showed no evidence of a PE.She did have significant pauses during admission which occurred up to 6.29 seconds and it was unclear if the patient was sleeping or awake at that time. Echocardiogram  showed a preserved EF of 70 to 75% with no regional wall motion normalities and no significant valve abnormalities. Given her intermittent pauses, TEE guided cardioversion performed on 07/11/2019  She was discharged on Eliquis  but was not placed on AV nodal blocking agents.  Readmitted 07/12/19 with Aflutter with RVR and hypoxic so DCCV in ED.home monitor and this showed prolonged pauses of up to 7.4 seconds which occurred while the patient was sleeping. Was sent to  ED  She denied any symptoms at that time and the EDP spoke with Dr. Rayann Heman who recommended outpatient EP.telehealth visit with Dr. Rayann Heman on 07/31/2019 and denied any recent dizziness or presyncope at that time. He recommended sleep study and she wasn't interested in [facility].  Patient comes in for f/u. Using CPAP with O2 at night. Feels so much better. Doing silver slippers at [facility] 3 times/week. No palpitations. BP running high today. Stopped drinking soda and watching salt closely.     Past Medical History:  Diagnosis Date  . Asthma   . Hypertension   . Paroxysmal atrial fibrillation [facility])     Past Surgical History:   Procedure Laterality Date  . CARDIOVERSION N/A 07/11/2019   Procedure: CARDIOVERSION;  Surgeon: Satira Sark, MD;  Location: [facility] ORS;  Service: Cardiovascular;  Laterality: N/A;  . COLONOSCOPY N/A 09/14/2017   Procedure: COLONOSCOPY;  Surgeon: Danie Binder, MD;  Location: [facility] ENDO SUITE;  Service: Endoscopy;  Laterality: N/A;  2:00  . TEE WITHOUT CARDIOVERSION N/A 07/11/2019   Procedure: TRANSESOPHAGEAL ECHOCARDIOGRAM (TEE) WITH PROPOFOL;  Surgeon: Satira Sark, MD;  Location: [facility] ORS;  Service: Cardiovascular;  Laterality: N/A;  . TUBAL LIGATION      Current Medications: Current Meds  Medication Sig  . albuterol (VENTOLIN HFA) 108 (90 Base) MCG/ACT inhaler Inhale 1-2 puffs into the lungs every 6 (six) hours as needed for wheezing or shortness of breath.  Arne Cleveland 5 MG TABS tablet TAKE 1 TABLET BY MOUTH TWICE DAILY.  Marland Kitchen lisinopril (ZESTRIL) 20 MG tablet Take 1 tablet (20 mg total) by mouth daily.  . [DISCONTINUED] lisinopril (ZESTRIL) 10 MG tablet Take 10 mg by mouth daily.     Allergies:   Strawberry c [ascorbic acid]   Social History   Socioeconomic History  . Marital status: Single    Spouse name: Not on file  . Number of children: Not on file  . Years of education: Not on file  . Highest education level: Not on file  Occupational History  . Not on file  Tobacco Use  . Smoking status: Never Smoker  . Smokeless tobacco:  Never Used  Vaping Use  . Vaping Use: Never used  Substance and Sexual Activity  . Alcohol use: Yes    Comment: Occasional  . Drug use: No  . Sexual activity: Not on file  Other Topics Concern  . Not on file  Social History Narrative  . Not on file   Social Determinants of Health   Financial Resource Strain: Not on file  Food Insecurity: No Food Insecurity  . Worried About Charity fundraiser in the Last Year: Never true  . Ran Out of Food in the Last Year: Never true  Transportation Needs: No Transportation Needs  . Lack of  Transportation (Medical): No  . Lack of Transportation (Non-Medical): No  Physical Activity: Not on file  Stress: Not on file  Social Connections: Not on file     Family History:  The patient's family history includes Cirrhosis in her father; Diabetes in her brother and mother; Kidney disease in her sister.   ROS:   Please see the history of present illness.    ROS All other systems reviewed and are negative.   PHYSICAL EXAM:   VS:  BP (!) 150/88   Pulse 63   Ht 5\' 8"  (1.727 m)   Wt 264 lb (119.7 kg)   SpO2 96%   BMI 40.14 kg/m   Physical Exam  GEN: Obese, in no acute distress  Neck: no JVD, carotid bruits, or masses Cardiac:RRR; 2/6 systolic murmur LSB  Respiratory:  clear to auscultation bilaterally, normal work of breathing GI: soft, nontender, nondistended, + BS Ext: without cyanosis, clubbing, or edema, Good distal pulses bilaterally Neuro:  Alert and Oriented x 3 Psych: euthymic mood, full affect  Wt Readings from Last 3 Encounters:  06/22/20 264 lb (119.7 kg)  10/04/19 279 lb (126.6 kg)  08/01/19 281 lb (127.5 kg)      Studies/Labs Reviewed:   EKG:  EKG is  ordered today.  The ekg ordered today demonstrates NSR 71/m, normal EKG  Recent Labs: 07/09/2019: B Natriuretic Peptide 281.0; TSH 1.908 07/13/2019: ALT 40 07/28/2019: BUN 8; Creatinine, Ser 0.74; Hemoglobin 11.8; Magnesium 1.9; Platelets 241; Potassium 3.9; Sodium 139   Lipid Panel No results found for: CHOL, TRIG, HDL, CHOLHDL, VLDL, LDLCALC, LDLDIRECT  Additional studies/ records that were reviewed today include:   Echocardiogram: 07/10/2019 IMPRESSIONS     1. Left ventricular ejection fraction, by estimation, is 70 to 75%. The  left ventricle has hyperdynamic function. The left ventricle has no  regional wall motion abnormalities. There is moderate left ventricular  hypertrophy. Left ventricular diastolic  parameters are indeterminate.   2. Right ventricular systolic function is normal. The right  ventricular  size is normal. Tricuspid regurgitation signal is inadequate for assessing  PA pressure.   3. Right atrial size was upper normal.   4. The mitral valve is grossly normal. Trivial mitral valve  regurgitation.   5. The aortic valve is tricuspid. Aortic valve regurgitation is not  visualized.   6. The inferior vena cava is dilated in size with >50% respiratory  variability, suggesting right atrial pressure of 8 mmHg.     Risk Assessment/Calculations:    CHA2DS2-VASc Score = 3  This indicates a 3.2% annual risk of stroke. The patient's score is based upon: CHF History: No HTN History: Yes Diabetes History: No Stroke History: No Vascular Disease History: No Age Score: 1 Gender Score: 1        ASSESSMENT:    1. Atrial fibrillation, unspecified type (  [place])   2. Sinus pause   3. Essential hypertension   4. OSA (obstructive sleep apnea)   5. Morbid obesity ([place])      PLAN:  In order of problems listed above: Persistent atrial fibrillation status post TEE guided cardioversion 07/11/2019, recurrent atrial fibrillation 07/12/2019 requiring repeat DCCV in the ED.  No AV nodal blocking agents given her significant nocturnal pauses on Eliquis 5 mg twice daily for anticoagulation  Sinus pauses up to 7.4 seconds during sleep asymptomatic.  Evaluated by Dr. Rayann Heman and sleep study recommended(OSA treated) but did not feel she needed a permanent pacemaker placement given no definitive daytime pauses and asymptomatic state. Doing better since sleep apnea treated.   Hypertension BP running high. Increase lisinopril 20 mg daily. She is scheduled for blood work with Dr. Nevada Crane in a couple weeks. I've asked this to be sent to [place].  OSA treated by Dr. Halford Chessman  Morbid obesity-weight loss recommended-weight watchers or similar program. Going to silver sneakers at the [facility]   Shared Decision Making/Informed Consent        Medication Adjustments/Labs and Tests Ordered: Current  medicines are reviewed at length with the patient today.  Concerns regarding medicines are outlined above.  Medication changes, Labs and Tests ordered today are listed in the Patient Instructions below. Patient Instructions   Medication Instructions:  Your physician has recommended you make the following change in your medication:   Increase Lisinopril to 20 mg Daily   *If you need a refill on your cardiac medications before your next appointment, please call your pharmacy*   Lab Work: NONE  If you have labs (blood work) drawn today and your tests are completely normal, you will receive your results only by: Marland Kitchen MyChart Message (if you have MyChart) OR . A paper copy in the mail If you have any lab test that is abnormal or we need to change your treatment, we will call you to review the results.   Testing/Procedures: NONE    Follow-Up: At [facility], you and your health needs are our priority.  As part of our continuing mission to provide you with exceptional heart care, we have created designated Provider Care Teams.  These Care Teams include your primary Cardiologist (physician) and Advanced Practice Providers (APPs -  Physician Assistants and Nurse Practitioners) who all work together to provide you with the care you need, when you need it.  We recommend signing up for the patient portal called "MyChart".  Sign up information is provided on this After Visit Summary.  MyChart is used to connect with patients for Virtual Visits (Telemedicine).  Patients are able to view lab/test results, encounter notes, upcoming appointments, etc.  Non-urgent messages can be sent to your provider as well.   To learn more about what you can do with MyChart, go to [email or web address].    Your next appointment:   6 month(s)  The format for your next appointment:   In Person  Provider:   Rozann Lesches, MD   Other Instructions Thank you for choosing [facility]!  Two Gram  Sodium Diet 2000 mg  What is Sodium? Sodium is a mineral found naturally in many foods. The most significant source of sodium in the diet is table salt, which is about 40% sodium.  Processed, convenience, and preserved foods also contain a large amount of sodium.  The body needs only 500 mg of sodium daily to function,  A normal diet provides more than enough sodium even if you  do not use salt.  Why Limit Sodium? A build up of sodium in the body can cause thirst, increased blood pressure, shortness of breath, and water retention.  Decreasing sodium in the diet can reduce edema and risk of heart attack or stroke associated with high blood pressure.  Keep in mind that there are many other factors involved in these health problems.  Heredity, obesity, lack of exercise, cigarette smoking, stress and what you eat all play a role.  General Guidelines:  Do not add salt at the table or in cooking.  One teaspoon of salt contains over 2 grams of sodium.  Read food labels  Avoid processed and convenience foods  Ask your dietitian before eating any foods not dicussed in the menu planning guidelines  Consult your physician if you wish to use a salt substitute or a sodium containing medication such as antacids.  Limit milk and milk products to 16 oz (2 cups) per day.  Shopping Hints:  READ LABELS!! "Dietetic" does not necessarily mean low sodium.  Salt and other sodium ingredients are often added to foods during processing.   Menu Planning Guidelines Food Group Choose More Often Avoid  Beverages (see also the milk group All fruit juices, low-sodium, salt-free vegetables juices, low-sodium carbonated beverages Regular vegetable or tomato juices, commercially softened water used for drinking or cooking  Breads and Cereals Enriched white, wheat, rye and pumpernickel bread, hard rolls and dinner rolls; muffins, cornbread and waffles; most dry cereals, cooked cereal without added salt; unsalted crackers and  breadsticks; low sodium or homemade bread crumbs Bread, rolls and crackers with salted tops; quick breads; instant hot cereals; pancakes; commercial bread stuffing; self-rising flower and biscuit mixes; regular bread crumbs or cracker crumbs  Desserts and Sweets Desserts and sweets mad with mild should be within allowance Instant pudding mixes and cake mixes  Fats Butter or margarine; vegetable oils; unsalted salad dressings, regular salad dressings limited to 1 Tbs; light, sour and heavy cream Regular salad dressings containing bacon fat, bacon bits, and salt pork; snack dips made with instant soup mixes or processed cheese; salted nuts  Fruits Most fresh, frozen and canned fruits Fruits processed with salt or sodium-containing ingredient (some dried fruits are processed with sodium sulfites        Vegetables Fresh, frozen vegetables and low- sodium canned vegetables Regular canned vegetables, sauerkraut, pickled vegetables, and others prepared in brine; frozen vegetables in sauces; vegetables seasoned with ham, bacon or salt pork  Condiments, Sauces, Miscellaneous  Salt substitute with physician's approval; pepper, herbs, spices; vinegar, lemon or lime juice; hot pepper sauce; garlic powder, onion powder, low sodium soy sauce (1 Tbs.); low sodium condiments (ketchup, chili sauce, mustard) in limited amounts (1 tsp.) fresh ground horseradish; unsalted tortilla chips, pretzels, potato chips, popcorn, salsa (1/4 cup) Any seasoning made with salt including garlic salt, celery salt, onion salt, and seasoned salt; sea salt, rock salt, kosher salt; meat tenderizers; monosodium glutamate; mustard, regular soy sauce, barbecue, sauce, chili sauce, teriyaki sauce, steak sauce, Worcestershire sauce, and most flavored vinegars; canned gravy and mixes; regular condiments; salted snack foods, olives, picles, relish, horseradish sauce, catsup   Food preparation: Try these seasonings Meats:    Pork Sage, onion  Serve with applesauce  Chicken Poultry seasoning, thyme, parsley Serve with cranberry sauce  Lamb Curry powder, rosemary, garlic, thyme Serve with mint sauce or jelly  Veal Marjoram, basil Serve with current jelly, cranberry sauce  Beef Pepper, bay leaf Serve with dry mustard, unsalted chive  butter  Fish [place] leaf, dill Serve with unsalted lemon butter, unsalted parsley butter  Vegetables:    Asparagus Lemon juice   Broccoli Lemon juice   Carrots Mustard dressing parsley, mint, nutmeg, glazed with unsalted butter and sugar   Green beans Marjoram, lemon juice, nutmeg,dill seed   Tomatoes Basil, marjoram, onion   Spice /blend for [employer]" 4 tsp ground thyme 1 tsp ground sage 3 tsp ground rosemary 4 tsp ground marjoram   Test your knowledge 1. A product that says "Salt Free" may still contain sodium. True or False 2. Garlic Powder and Hot Pepper Sauce an be used as alternative seasonings.True or False 3. Processed foods have more sodium than fresh foods.  True or False 4. Canned Vegetables have less sodium than froze True or False  WAYS TO DECREASE YOUR SODIUM INTAKE 1. Avoid the use of added salt in cooking and at the table.  Table salt (and other prepared seasonings which contain salt) is probably one of the greatest sources of sodium in the diet.  Unsalted foods can gain flavor from the sweet, sour, and butter taste sensations of herbs and spices.  Instead of using salt for seasoning, try the following seasonings with the foods listed.  Remember: how you use them to enhance natural food flavors is limited only by your creativity... Allspice-Meat, fish, eggs, fruit, peas, red and yellow vegetables Almond Extract-Fruit baked goods Anise Seed-Sweet breads, fruit, carrots, beets, cottage cheese, cookies (tastes like licorice) Basil-Meat, fish, eggs, vegetables, rice, vegetables salads, soups, sauces Bay Leaf-Meat, fish, stews, poultry Burnet-Salad, vegetables (cucumber-like  flavor) Caraway Seed-Bread, cookies, cottage cheese, meat, vegetables, cheese, rice Cardamon-Baked goods, fruit, soups Celery Powder or seed-Salads, salad dressings, sauces, meatloaf, soup, bread.Do not use  celery salt Chervil-Meats, salads, fish, eggs, vegetables, cottage cheese (parsley-like flavor) Chili Power-Meatloaf, chicken cheese, corn, eggplant, egg dishes Chives-Salads cottage cheese, egg dishes, soups, vegetables, sauces Cilantro-Salsa, casseroles Cinnamon-Baked goods, fruit, pork, lamb, chicken, carrots Cloves-Fruit, baked goods, fish, pot roast, green beans, beets, carrots Coriander-Pastry, cookies, meat, salads, cheese (lemon-orange flavor) Cumin-Meatloaf, fish,cheese, eggs, cabbage,fruit pie (caraway flavor) [employer], fruit, eggs, fish, poultry, cottage cheese, vegetables Dill Seed-Meat, cottage cheese, poultry, vegetables, fish, salads, bread Fennel Seed-Bread, cookies, apples, pork, eggs, fish, beets, cabbage, cheese, Licorice-like flavor Garlic-(buds or powder) Salads, meat, poultry, fish, bread, butter, vegetables, potatoes.Do not  use garlic salt Ginger-Fruit, vegetables, baked goods, meat, fish, poultry Horseradish Root-Meet, vegetables, butter Lemon Juice or Extract-Vegetables, fruit, tea, baked goods, fish salads Mace-Baked goods fruit, vegetables, fish, poultry (taste like nutmeg) Maple Extract-Syrups Marjoram-Meat, chicken, fish, vegetables, breads, green salads (taste like Sage) Mint-Tea, lamb, sherbet, vegetables, desserts, carrots, cabbage Mustard, Dry or Seed-Cheese, eggs, meats, vegetables, poultry Nutmeg-Baked goods, fruit, chicken, eggs, vegetables, desserts Onion Powder-Meat, fish, poultry, vegetables, cheese, eggs, bread, rice salads (Do not use   Onion salt) Orange Extract-Desserts, baked goods Oregano-Pasta, eggs, cheese, onions, pork, lamb, fish, chicken, vegetables, green salads Paprika-Meat, fish, poultry, eggs, cheese, vegetables Parsley  Flakes-Butter, vegetables, meat fish, poultry, eggs, bread, salads (certain forms may   Contain sodium Pepper-Meat fish, poultry, vegetables, eggs Peppermint Extract-Desserts, baked goods Poppy Seed-Eggs, bread, cheese, fruit dressings, baked goods, noodles, vegetables, cottage  [employer], poultry, meat, fish, cauliflower, turnips,eggs bread Saffron-Rice, bread, veal, chicken, fish, eggs Sage-Meat, fish, poultry, onions, eggplant, tomateos, pork, stews Savory-Eggs, salads, poultry, meat, rice, vegetables, soups, pork Tarragon-Meat, poultry, fish, eggs, butter, vegetables (licorice-like flavor)  Thyme-Meat, poultry, fish, eggs, vegetables, (clover-like flavor), sauces, soups Tumeric-Salads, butter, eggs, fish, rice,  vegetables (saffron-like flavor) Vanilla Extract-Baked goods, candy Vinegar-Salads, vegetables, meat marinades Walnut Extract-baked goods, candy  2. Choose your Foods Wisely   The following is a list of foods to avoid which are high in sodium:  Meats-Avoid all smoked, canned, salt cured, dried and kosher meat and fish as well as Anchovies   [employer] meats:Bologna, Liverwurst, Pastrami Canned meat or fish  Marinated herring Caviar    Pepperoni Corned Beef   Pizza Dried chipped beef  Salami Frozen breaded fish or meat Salt pork Frankfurters or hot dogs  Sardines Gefilte fish   Sausage Ham (boiled ham, Proscuitto Smoked butt    spiced ham)   Spam      TV Dinners Vegetables Canned vegetables (Regular) Relish Canned mushrooms  Sauerkraut Olives    Tomato juice Pickles  Bakery and Dessert Products Canned puddings  Cream pies Cheesecake   Decorated cakes Cookies  Beverages/Juices Tomato juice, regular  Gatorade   V-8 vegetable juice, regular  Breads and Cereals Biscuit mixes   Salted potato chips, corn chips, pretzels Bread stuffing mixes  Salted crackers and rolls Pancake and waffle mixes Self-rising  flour  Seasonings Accent    Meat sauces Barbecue sauce  Meat tenderizer Catsup    Monosodium glutamate (MSG) Celery salt   Onion salt Chili sauce   Prepared mustard Garlic salt   Salt, seasoned salt, sea salt Gravy mixes   Soy sauce Horseradish   Steak sauce Ketchup   Tartar sauce Lite salt    Teriyaki sauce Marinade mixes   Worcestershire sauce  Others Baking powder   Cocoa and cocoa mixes Baking soda   Commercial casserole mixes Candy-caramels, chocolate  Dehydrated soups    Bars, fudge,nougats  Instant rice and pasta mixes Canned broth or soup  Maraschino cherries Cheese, aged and processed cheese and cheese spreads  Learning Assessment Quiz  Indicated T (for True) or F (for False) for each of the following statements:  1. _____ Fresh fruits and vegetables and unprocessed grains are generally low in sodium 2. _____ Water may contain a considerable amount of sodium, depending on the source 3. _____ You can always tell if a food is high in sodium by tasting it 4. _____ Certain laxatives my be high in sodium and should be avoided unless prescribed   by a physician or pharmacist 5. _____ Salt substitutes may be used freely by anyone on a sodium restricted diet 6. _____ Sodium is present in table salt, food additives and as a natural component of   most foods 7. _____ Table salt is approximately 90% sodium 8. _____ Limiting sodium intake may help prevent excess fluid accumulation in the body 9. _____ On a sodium-restricted diet, seasonings such as bouillon soy sauce, and    cooking wine should be used in place of table salt 10. _____ On an ingredient list, a product which lists monosodium glutamate as the first   ingredient is an appropriate food to include on a low sodium diet  Circle the best answer(s) to the following statements (Hint: there may be more than one correct answer)  11. On a low-sodium diet, some acceptable snack items are:    A. Olives  F. Bean dip   K.  Grapefruit juice    B. Salted Pretzels G. Commercial Popcorn   L. Canned peaches    C. Carrot Sticks  H. Bouillon   M. Unsalted nuts   D. [place] fries  I. Peanut butter crackers N. Salami  E. Sweet pickles J. Tomato Juice   O. Pizza  12.  Seasonings that may be used freely on a reduced - sodium diet include   A. Lemon wedges F.Monosodium glutamate K. Celery seed    B.Soysauce   G. Pepper   L. Mustard powder   C. Sea salt  H. Cooking wine  M. Onion flakes   D. Vinegar  E. Prepared horseradish N. Salsa   E. Sage   J. Worcestershire sauce  O. Chutney      Signed, Ermalinda Barrios, PA-C  06/22/2020 2:20 PM    [facility] [address], [place], [place]  [address] Phone: [phone]; Fax: [phone]

## 2020-06-10 DIAGNOSIS — J4521 Mild intermittent asthma with (acute) exacerbation: Secondary | ICD-10-CM | POA: Diagnosis not present

## 2020-06-10 DIAGNOSIS — Z825 Family history of asthma and other chronic lower respiratory diseases: Secondary | ICD-10-CM | POA: Diagnosis not present

## 2020-06-22 ENCOUNTER — Ambulatory Visit (INDEPENDENT_AMBULATORY_CARE_PROVIDER_SITE_OTHER): Payer: Medicare Other | Admitting: Physician Assistant

## 2020-06-22 ENCOUNTER — Other Ambulatory Visit: Payer: Self-pay

## 2020-06-22 ENCOUNTER — Encounter: Payer: Self-pay | Admitting: Physician Assistant

## 2020-06-22 VITALS — BP 150/88 | HR 63 | Ht 68.0 in | Wt 264.0 lb

## 2020-06-22 DIAGNOSIS — I455 Other specified heart block: Secondary | ICD-10-CM

## 2020-06-22 DIAGNOSIS — I10 Essential (primary) hypertension: Secondary | ICD-10-CM | POA: Diagnosis not present

## 2020-06-22 DIAGNOSIS — G4733 Obstructive sleep apnea (adult) (pediatric): Secondary | ICD-10-CM

## 2020-06-22 DIAGNOSIS — I4891 Unspecified atrial fibrillation: Secondary | ICD-10-CM

## 2020-06-22 DIAGNOSIS — J9600 Acute respiratory failure, unspecified whether with hypoxia or hypercapnia: Secondary | ICD-10-CM | POA: Diagnosis not present

## 2020-06-22 MED ORDER — LISINOPRIL 40 MG PO TABS
40.0000 mg | ORAL_TABLET | Freq: Every day | ORAL | 3 refills | Status: DC
Start: 2020-06-22 — End: 2021-04-20

## 2020-06-22 MED ORDER — LISINOPRIL 20 MG PO TABS
20.0000 mg | ORAL_TABLET | Freq: Every day | ORAL | 3 refills | Status: DC
Start: 2020-06-22 — End: 2020-06-22

## 2020-06-22 NOTE — Patient Instructions (Signed)
Medication Instructions:  Your physician has recommended you make the following change in your medication:   Increase Lisinopril to 20 mg Daily   *If you need a refill on your cardiac medications before your next appointment, please call your pharmacy*   Lab Work: NONE  If you have labs (blood work) drawn today and your tests are completely normal, you will receive your results only by: Marland Kitchen MyChart Message (if you have MyChart) OR . A paper copy in the mail If you have any lab test that is abnormal or we need to change your treatment, we will call you to review the results.   Testing/Procedures: NONE    Follow-Up: At [facility], you and your health needs are our priority.  As part of our continuing mission to provide you with exceptional heart care, we have created designated Provider Care Teams.  These Care Teams include your primary Cardiologist (physician) and Advanced Practice Providers (APPs -  Physician Assistants and Nurse Practitioners) who all work together to provide you with the care you need, when you need it.  We recommend signing up for the patient portal called "MyChart".  Sign up information is provided on this After Visit Summary.  MyChart is used to connect with patients for Virtual Visits (Telemedicine).  Patients are able to view lab/test results, encounter notes, upcoming appointments, etc.  Non-urgent messages can be sent to your provider as well.   To learn more about what you can do with MyChart, go to [email or web address].    Your next appointment:   6 month(s)  The format for your next appointment:   In Person  Provider:   Rozann Lesches, MD   Other Instructions Thank you for choosing [facility]!  Two Gram Sodium Diet 2000 mg  What is Sodium? Sodium is a mineral found naturally in many foods. The most significant source of sodium in the diet is table salt, which is about 40% sodium.  Processed, convenience, and preserved foods also  contain a large amount of sodium.  The body needs only 500 mg of sodium daily to function,  A normal diet provides more than enough sodium even if you do not use salt.  Why Limit Sodium? A build up of sodium in the body can cause thirst, increased blood pressure, shortness of breath, and water retention.  Decreasing sodium in the diet can reduce edema and risk of heart attack or stroke associated with high blood pressure.  Keep in mind that there are many other factors involved in these health problems.  Heredity, obesity, lack of exercise, cigarette smoking, stress and what you eat all play a role.  General Guidelines:  Do not add salt at the table or in cooking.  One teaspoon of salt contains over 2 grams of sodium.  Read food labels  Avoid processed and convenience foods  Ask your dietitian before eating any foods not dicussed in the menu planning guidelines  Consult your physician if you wish to use a salt substitute or a sodium containing medication such as antacids.  Limit milk and milk products to 16 oz (2 cups) per day.  Shopping Hints:  READ LABELS!! "Dietetic" does not necessarily mean low sodium.  Salt and other sodium ingredients are often added to foods during processing.   Menu Planning Guidelines Food Group Choose More Often Avoid  Beverages (see also the milk group All fruit juices, low-sodium, salt-free vegetables juices, low-sodium carbonated beverages Regular vegetable or tomato juices, commercially softened water used for  drinking or cooking  Breads and Cereals Enriched white, wheat, rye and pumpernickel bread, hard rolls and dinner rolls; muffins, cornbread and waffles; most dry cereals, cooked cereal without added salt; unsalted crackers and breadsticks; low sodium or homemade bread crumbs Bread, rolls and crackers with salted tops; quick breads; instant hot cereals; pancakes; commercial bread stuffing; self-rising flower and biscuit mixes; regular bread crumbs or  cracker crumbs  Desserts and Sweets Desserts and sweets mad with mild should be within allowance Instant pudding mixes and cake mixes  Fats Butter or margarine; vegetable oils; unsalted salad dressings, regular salad dressings limited to 1 Tbs; light, sour and heavy cream Regular salad dressings containing bacon fat, bacon bits, and salt pork; snack dips made with instant soup mixes or processed cheese; salted nuts  Fruits Most fresh, frozen and canned fruits Fruits processed with salt or sodium-containing ingredient (some dried fruits are processed with sodium sulfites        Vegetables Fresh, frozen vegetables and low- sodium canned vegetables Regular canned vegetables, sauerkraut, pickled vegetables, and others prepared in brine; frozen vegetables in sauces; vegetables seasoned with ham, bacon or salt pork  Condiments, Sauces, Miscellaneous  Salt substitute with physician's approval; pepper, herbs, spices; vinegar, lemon or lime juice; hot pepper sauce; garlic powder, onion powder, low sodium soy sauce (1 Tbs.); low sodium condiments (ketchup, chili sauce, mustard) in limited amounts (1 tsp.) fresh ground horseradish; unsalted tortilla chips, pretzels, potato chips, popcorn, salsa (1/4 cup) Any seasoning made with salt including garlic salt, celery salt, onion salt, and seasoned salt; sea salt, rock salt, kosher salt; meat tenderizers; monosodium glutamate; mustard, regular soy sauce, barbecue, sauce, chili sauce, teriyaki sauce, steak sauce, Worcestershire sauce, and most flavored vinegars; canned gravy and mixes; regular condiments; salted snack foods, olives, picles, relish, horseradish sauce, catsup   Food preparation: Try these seasonings Meats:    Pork Sage, onion Serve with applesauce  Chicken Poultry seasoning, thyme, parsley Serve with cranberry sauce  Lamb Curry powder, rosemary, garlic, thyme Serve with mint sauce or jelly  Veal Marjoram, basil Serve with current jelly, cranberry  sauce  Beef Pepper, bay leaf Serve with dry mustard, unsalted chive butter  Fish [place] leaf, dill Serve with unsalted lemon butter, unsalted parsley butter  Vegetables:    Asparagus Lemon juice   Broccoli Lemon juice   Carrots Mustard dressing parsley, mint, nutmeg, glazed with unsalted butter and sugar   Green beans Marjoram, lemon juice, nutmeg,dill seed   Tomatoes Basil, marjoram, onion   Spice /blend for [employer]" 4 tsp ground thyme 1 tsp ground sage 3 tsp ground rosemary 4 tsp ground marjoram   Test your knowledge 1. A product that says "Salt Free" may still contain sodium. True or False 2. Garlic Powder and Hot Pepper Sauce an be used as alternative seasonings.True or False 3. Processed foods have more sodium than fresh foods.  True or False 4. Canned Vegetables have less sodium than froze True or False  WAYS TO DECREASE YOUR SODIUM INTAKE 1. Avoid the use of added salt in cooking and at the table.  Table salt (and other prepared seasonings which contain salt) is probably one of the greatest sources of sodium in the diet.  Unsalted foods can gain flavor from the sweet, sour, and butter taste sensations of herbs and spices.  Instead of using salt for seasoning, try the following seasonings with the foods listed.  Remember: how you use them to enhance natural food flavors is limited only by your  creativity... Allspice-Meat, fish, eggs, fruit, peas, red and yellow vegetables Almond Extract-Fruit baked goods Anise Seed-Sweet breads, fruit, carrots, beets, cottage cheese, cookies (tastes like licorice) Basil-Meat, fish, eggs, vegetables, rice, vegetables salads, soups, sauces Bay Leaf-Meat, fish, stews, poultry Burnet-Salad, vegetables (cucumber-like flavor) Caraway Seed-Bread, cookies, cottage cheese, meat, vegetables, cheese, rice Cardamon-Baked goods, fruit, soups Celery Powder or seed-Salads, salad dressings, sauces, meatloaf, soup, bread.Do not use  celery salt Chervil-Meats,  salads, fish, eggs, vegetables, cottage cheese (parsley-like flavor) Chili Power-Meatloaf, chicken cheese, corn, eggplant, egg dishes Chives-Salads cottage cheese, egg dishes, soups, vegetables, sauces Cilantro-Salsa, casseroles Cinnamon-Baked goods, fruit, pork, lamb, chicken, carrots Cloves-Fruit, baked goods, fish, pot roast, green beans, beets, carrots Coriander-Pastry, cookies, meat, salads, cheese (lemon-orange flavor) Cumin-Meatloaf, fish,cheese, eggs, cabbage,fruit pie (caraway flavor) [employer], fruit, eggs, fish, poultry, cottage cheese, vegetables Dill Seed-Meat, cottage cheese, poultry, vegetables, fish, salads, bread Fennel Seed-Bread, cookies, apples, pork, eggs, fish, beets, cabbage, cheese, Licorice-like flavor Garlic-(buds or powder) Salads, meat, poultry, fish, bread, butter, vegetables, potatoes.Do not  use garlic salt Ginger-Fruit, vegetables, baked goods, meat, fish, poultry Horseradish Root-Meet, vegetables, butter Lemon Juice or Extract-Vegetables, fruit, tea, baked goods, fish salads Mace-Baked goods fruit, vegetables, fish, poultry (taste like nutmeg) Maple Extract-Syrups Marjoram-Meat, chicken, fish, vegetables, breads, green salads (taste like Sage) Mint-Tea, lamb, sherbet, vegetables, desserts, carrots, cabbage Mustard, Dry or Seed-Cheese, eggs, meats, vegetables, poultry Nutmeg-Baked goods, fruit, chicken, eggs, vegetables, desserts Onion Powder-Meat, fish, poultry, vegetables, cheese, eggs, bread, rice salads (Do not use   Onion salt) Orange Extract-Desserts, baked goods Oregano-Pasta, eggs, cheese, onions, pork, lamb, fish, chicken, vegetables, green salads Paprika-Meat, fish, poultry, eggs, cheese, vegetables Parsley Flakes-Butter, vegetables, meat fish, poultry, eggs, bread, salads (certain forms may   Contain sodium Pepper-Meat fish, poultry, vegetables, eggs Peppermint Extract-Desserts, baked goods Poppy Seed-Eggs, bread, cheese, fruit  dressings, baked goods, noodles, vegetables, cottage  [employer], poultry, meat, fish, cauliflower, turnips,eggs bread Saffron-Rice, bread, veal, chicken, fish, eggs Sage-Meat, fish, poultry, onions, eggplant, tomateos, pork, stews Savory-Eggs, salads, poultry, meat, rice, vegetables, soups, pork Tarragon-Meat, poultry, fish, eggs, butter, vegetables (licorice-like flavor)  Thyme-Meat, poultry, fish, eggs, vegetables, (clover-like flavor), sauces, soups Tumeric-Salads, butter, eggs, fish, rice, vegetables (saffron-like flavor) Vanilla Extract-Baked goods, candy Vinegar-Salads, vegetables, meat marinades Walnut Extract-baked goods, candy  2. Choose your Foods Wisely   The following is a list of foods to avoid which are high in sodium:  Meats-Avoid all smoked, canned, salt cured, dried and kosher meat and fish as well as Anchovies   [employer] meats:Bologna, Liverwurst, Pastrami Canned meat or fish  Marinated herring Caviar    Pepperoni Corned Beef   Pizza Dried chipped beef  Salami Frozen breaded fish or meat Salt pork Frankfurters or hot dogs  Sardines Gefilte fish   Sausage Ham (boiled ham, Proscuitto Smoked butt    spiced ham)   Spam      TV Dinners Vegetables Canned vegetables (Regular) Relish Canned mushrooms  Sauerkraut Olives    Tomato juice Pickles  Bakery and Dessert Products Canned puddings  Cream pies Cheesecake   Decorated cakes Cookies  Beverages/Juices Tomato juice, regular  Gatorade   V-8 vegetable juice, regular  Breads and Cereals Biscuit mixes   Salted potato chips, corn chips, pretzels Bread stuffing mixes  Salted crackers and rolls Pancake and waffle mixes Self-rising flour  Seasonings Accent    Meat sauces Barbecue sauce  Meat tenderizer Catsup    Monosodium glutamate (MSG) Celery salt   Onion salt Chili sauce  Prepared mustard Garlic salt   Salt, seasoned salt, sea salt Gravy mixes   Soy  sauce Horseradish   Steak sauce Ketchup   Tartar sauce Lite salt    Teriyaki sauce Marinade mixes   Worcestershire sauce  Others Baking powder   Cocoa and cocoa mixes Baking soda   Commercial casserole mixes Candy-caramels, chocolate  Dehydrated soups    Bars, fudge,nougats  Instant rice and pasta mixes Canned broth or soup  Maraschino cherries Cheese, aged and processed cheese and cheese spreads  Learning Assessment Quiz  Indicated T (for True) or F (for False) for each of the following statements:  1. _____ Fresh fruits and vegetables and unprocessed grains are generally low in sodium 2. _____ Water may contain a considerable amount of sodium, depending on the source 3. _____ You can always tell if a food is high in sodium by tasting it 4. _____ Certain laxatives my be high in sodium and should be avoided unless prescribed   by a physician or pharmacist 5. _____ Salt substitutes may be used freely by anyone on a sodium restricted diet 6. _____ Sodium is present in table salt, food additives and as a natural component of   most foods 7. _____ Table salt is approximately 90% sodium 8. _____ Limiting sodium intake may help prevent excess fluid accumulation in the body 9. _____ On a sodium-restricted diet, seasonings such as bouillon soy sauce, and    cooking wine should be used in place of table salt 10. _____ On an ingredient list, a product which lists monosodium glutamate as the first   ingredient is an appropriate food to include on a low sodium diet  Circle the best answer(s) to the following statements (Hint: there may be more than one correct answer)  11. On a low-sodium diet, some acceptable snack items are:    A. Olives  F. Bean dip   K. Grapefruit juice    B. Salted Pretzels G. Commercial Popcorn   L. Canned peaches    C. Carrot Sticks  H. Bouillon   M. Unsalted nuts   D. [place] fries  I. Peanut butter crackers N. Salami   E. Sweet pickles J. Tomato Juice   O.  Pizza  12.  Seasonings that may be used freely on a reduced - sodium diet include   A. Lemon wedges F.Monosodium glutamate K. Celery seed    B.Soysauce   G. Pepper   L. Mustard powder   C. Sea salt  H. Cooking wine  M. Onion flakes   D. Vinegar  E. Prepared horseradish N. Salsa   E. Sage   J. Worcestershire sauce  O. Chutney

## 2020-06-22 NOTE — Progress Notes (Signed)
Pt call office to notify staff that she is taking Lisinopril 20 mg daily. Reviewed with M.Lenze PA-C. Pt to increase Lisinopril to 40 mg daily. Pt notified and voiced understanding.

## 2020-06-22 NOTE — Addendum Note (Signed)
Addended by: Levonne Hubert on: 06/22/2020 03:07 PM   Modules accepted: Orders

## 2020-07-09 DIAGNOSIS — R7303 Prediabetes: Secondary | ICD-10-CM | POA: Diagnosis not present

## 2020-07-09 DIAGNOSIS — I10 Essential (primary) hypertension: Secondary | ICD-10-CM | POA: Diagnosis not present

## 2020-07-10 DIAGNOSIS — J4521 Mild intermittent asthma with (acute) exacerbation: Secondary | ICD-10-CM | POA: Diagnosis not present

## 2020-07-10 DIAGNOSIS — Z825 Family history of asthma and other chronic lower respiratory diseases: Secondary | ICD-10-CM | POA: Diagnosis not present

## 2020-07-15 ENCOUNTER — Other Ambulatory Visit: Payer: Self-pay | Admitting: Student

## 2020-07-15 DIAGNOSIS — E785 Hyperlipidemia, unspecified: Secondary | ICD-10-CM | POA: Diagnosis not present

## 2020-07-15 DIAGNOSIS — I10 Essential (primary) hypertension: Secondary | ICD-10-CM | POA: Diagnosis not present

## 2020-07-15 DIAGNOSIS — I4891 Unspecified atrial fibrillation: Secondary | ICD-10-CM | POA: Diagnosis not present

## 2020-07-15 DIAGNOSIS — R7303 Prediabetes: Secondary | ICD-10-CM | POA: Diagnosis not present

## 2020-07-15 DIAGNOSIS — M7989 Other specified soft tissue disorders: Secondary | ICD-10-CM | POA: Diagnosis not present

## 2020-07-15 DIAGNOSIS — R809 Proteinuria, unspecified: Secondary | ICD-10-CM | POA: Diagnosis not present

## 2020-07-15 DIAGNOSIS — J45909 Unspecified asthma, uncomplicated: Secondary | ICD-10-CM | POA: Diagnosis not present

## 2020-07-15 DIAGNOSIS — Z1211 Encounter for screening for malignant neoplasm of colon: Secondary | ICD-10-CM | POA: Diagnosis not present

## 2020-07-15 NOTE — Telephone Encounter (Signed)
Prescription refill request for Eliquis received. Indication: PAF Last office visit: 06/22/20 with M.Lenze PAC Scr: 0.83 on 02/19/20 Age:  70 Weight: 119.7kg  Based on above findings Eliquis 5mg  twice daily is the appropriate dose.  Refill approved.

## 2020-08-10 DIAGNOSIS — Z825 Family history of asthma and other chronic lower respiratory diseases: Secondary | ICD-10-CM | POA: Diagnosis not present

## 2020-08-10 DIAGNOSIS — J4521 Mild intermittent asthma with (acute) exacerbation: Secondary | ICD-10-CM | POA: Diagnosis not present

## 2020-08-22 DIAGNOSIS — J9600 Acute respiratory failure, unspecified whether with hypoxia or hypercapnia: Secondary | ICD-10-CM | POA: Diagnosis not present

## 2020-09-03 ENCOUNTER — Encounter: Payer: Self-pay | Admitting: *Deleted

## 2020-09-22 DIAGNOSIS — J9600 Acute respiratory failure, unspecified whether with hypoxia or hypercapnia: Secondary | ICD-10-CM | POA: Diagnosis not present

## 2020-09-29 DIAGNOSIS — H25012 Cortical age-related cataract, left eye: Secondary | ICD-10-CM | POA: Diagnosis not present

## 2020-10-23 DIAGNOSIS — J9600 Acute respiratory failure, unspecified whether with hypoxia or hypercapnia: Secondary | ICD-10-CM | POA: Diagnosis not present

## 2020-11-16 DIAGNOSIS — Z23 Encounter for immunization: Secondary | ICD-10-CM | POA: Diagnosis not present

## 2020-11-22 DIAGNOSIS — J9600 Acute respiratory failure, unspecified whether with hypoxia or hypercapnia: Secondary | ICD-10-CM | POA: Diagnosis not present

## 2020-12-23 DIAGNOSIS — J9600 Acute respiratory failure, unspecified whether with hypoxia or hypercapnia: Secondary | ICD-10-CM | POA: Diagnosis not present

## 2021-01-14 DIAGNOSIS — I10 Essential (primary) hypertension: Secondary | ICD-10-CM | POA: Diagnosis not present

## 2021-01-14 DIAGNOSIS — R7303 Prediabetes: Secondary | ICD-10-CM | POA: Diagnosis not present

## 2021-01-18 ENCOUNTER — Other Ambulatory Visit (HOSPITAL_COMMUNITY): Payer: Self-pay | Admitting: Family Medicine

## 2021-01-18 DIAGNOSIS — Z1211 Encounter for screening for malignant neoplasm of colon: Secondary | ICD-10-CM | POA: Diagnosis not present

## 2021-01-18 DIAGNOSIS — I4891 Unspecified atrial fibrillation: Secondary | ICD-10-CM | POA: Diagnosis not present

## 2021-01-18 DIAGNOSIS — Z1239 Encounter for other screening for malignant neoplasm of breast: Secondary | ICD-10-CM | POA: Diagnosis not present

## 2021-01-18 DIAGNOSIS — R7303 Prediabetes: Secondary | ICD-10-CM | POA: Diagnosis not present

## 2021-01-18 DIAGNOSIS — Z1231 Encounter for screening mammogram for malignant neoplasm of breast: Secondary | ICD-10-CM

## 2021-01-18 DIAGNOSIS — Z0001 Encounter for general adult medical examination with abnormal findings: Secondary | ICD-10-CM | POA: Diagnosis not present

## 2021-01-22 DIAGNOSIS — J9600 Acute respiratory failure, unspecified whether with hypoxia or hypercapnia: Secondary | ICD-10-CM | POA: Diagnosis not present

## 2021-01-27 ENCOUNTER — Encounter: Payer: Self-pay | Admitting: Internal Medicine

## 2021-02-03 ENCOUNTER — Ambulatory Visit (HOSPITAL_COMMUNITY)
Admission: RE | Admit: 2021-02-03 | Discharge: 2021-02-03 | Disposition: A | Discharge status: Home / Self Care | Payer: Medicare Other | Source: Ambulatory Visit | Attending: Family Medicine | Admitting: Family Medicine

## 2021-02-03 ENCOUNTER — Other Ambulatory Visit: Payer: Self-pay

## 2021-02-03 DIAGNOSIS — Z1231 Encounter for screening mammogram for malignant neoplasm of breast: Secondary | ICD-10-CM | POA: Diagnosis not present

## 2021-02-22 DIAGNOSIS — J9600 Acute respiratory failure, unspecified whether with hypoxia or hypercapnia: Secondary | ICD-10-CM | POA: Diagnosis not present

## 2021-02-25 DIAGNOSIS — J40 Bronchitis, not specified as acute or chronic: Secondary | ICD-10-CM | POA: Diagnosis not present

## 2021-03-02 ENCOUNTER — Other Ambulatory Visit: Payer: Self-pay | Admitting: Cardiology

## 2021-03-02 NOTE — Telephone Encounter (Signed)
Prescription refill request for Eliquis received. Indication: Atrial Fib Last office visit: 06/22/20  Gerrianne Scale PA-C Scr: 0.88 on 07/09/20 Age: 71 Weight: 119.7  Based on above findings Eliquis 5mg  twice daily is the appropriate dose.  Patient is PAST DUE for appointment with Dr Domenic Polite and lab work.  Sent to nurse and scheduler to arrange appt and CBC/BMP.  Refill approved x 1

## 2021-03-16 ENCOUNTER — Ambulatory Visit: Payer: Medicare Other

## 2021-03-25 DIAGNOSIS — J9600 Acute respiratory failure, unspecified whether with hypoxia or hypercapnia: Secondary | ICD-10-CM | POA: Diagnosis not present

## 2021-04-19 ENCOUNTER — Other Ambulatory Visit: Payer: Self-pay | Admitting: Physician Assistant

## 2021-04-22 DIAGNOSIS — J9600 Acute respiratory failure, unspecified whether with hypoxia or hypercapnia: Secondary | ICD-10-CM | POA: Diagnosis not present

## 2021-05-04 NOTE — Progress Notes (Deleted)
? ?Cardiology Office Note   ? ?Date:  05/04/2021  ? ?ID:  Carolyn Silva, DOB 10-Dec-1950, MRN [id number] ? ?PCP:  Celene Squibb, MD  ?Cardiologist: Rozann Lesches, MD   ? ?No chief complaint on file. ? ? ?History of Present Illness:   ? ?Carolyn Silva is a 71 y.o. female with past medical history of persistent atrial fibrillation (s/p DCCV in 06/2019), HTN, nocturnal pauses (occurring by her monitor prior to initiation of CPAP and PPM not recommended by EP), OSA (on CPAP), obesity and asthma who presents to the office today for 85-monthfollow-up. ? ?She was last examined by MErmalinda Barrios PA-C in 06/2020 and [facility].  Her BP was elevated and Lisinopril was titrated from 20 mg daily to 40 mg daily ? ?- CBC, BMET ? ?Past Medical History:  ?Diagnosis Date  ? Asthma   ? Hypertension   ? Paroxysmal atrial fibrillation (HCC)   ? ? ?Past Surgical History:  ?Procedure Laterality Date  ? CARDIOVERSION N/A 07/11/2019  ? Procedure: CARDIOVERSION;  Surgeon: MSatira Sark MD;  Location: [facility] ORS;  Service: Cardiovascular;  Laterality: N/A;  ? COLONOSCOPY N/A 09/14/2017  ? Procedure: COLONOSCOPY;  Surgeon: FDanie Binder MD;  Location: [facility] ENDO SUITE;  Service: Endoscopy;  Laterality: N/A;  2:00  ? TEE WITHOUT CARDIOVERSION N/A 07/11/2019  ? Procedure: TRANSESOPHAGEAL ECHOCARDIOGRAM (TEE) WITH PROPOFOL;  Surgeon: MSatira Sark MD;  Location: [facility] ORS;  Service: Cardiovascular;  Laterality: N/A;  ? TUBAL LIGATION    ? ? ?Current Medications: ?Outpatient Medications Prior to Visit  ?Medication Sig Dispense Refill  ? albuterol (VENTOLIN HFA) 108 (90 Base) MCG/ACT inhaler Inhale 1-2 puffs into the lungs every 6 (six) hours as needed for wheezing or shortness of breath. 18 g 2  ? ELIQUIS 5 MG TABS tablet TAKE 1 TABLET BY MOUTH TWICE DAILY. 180 tablet 0  ? lisinopril (ZESTRIL) 40 MG tablet TAKE ONE TABLET BY MOUTH ONCE DAILY. 90 tablet 2  ? ?No  facility-administered medications prior to visit.  ?  ? ?Allergies:   Strawberry c [ascorbic acid]  ? ?Social History  ? ?Socioeconomic History  ? Marital status: Single  ?  Spouse name: Not on file  ? Number of children: Not on file  ? Years of education: Not on file  ? Highest education level: Not on file  ?Occupational History  ? Not on file  ?Tobacco Use  ? Smoking status: Never  ? Smokeless tobacco: Never  ?Vaping Use  ? Vaping Use: Never used  ?Substance and Sexual Activity  ? Alcohol use: Yes  ?  Comment: Occasional  ? Drug use: No  ? Sexual activity: Not on file  ?Other Topics Concern  ? Not on file  ?Social History Narrative  ? Not on file  ? ?Social Determinants of Health  ? ?Financial Resource Strain: Not on file  ?Food Insecurity: Not on file  ?Transportation Needs: Not on file  ?Physical Activity: Not on file  ?Stress: Not on file  ?Social Connections: Not on file  ?  ? ?Family History:  The patient's ***family history includes Cirrhosis in her father; Diabetes in her brother and mother; Kidney disease in her sister.  ? ?Review of Systems:   ? ?Please see the history of present illness.    ? ?All other systems reviewed and are otherwise negative except as noted above. ? ? ?Physical Exam:   ? ?  VS:  There were no vitals taken for this visit.   ?General: Well developed, well nourished,female appearing in no acute distress. ?Head: Normocephalic, atraumatic. ?Neck: No carotid bruits. JVD not elevated.  ?Lungs: Respirations regular and unlabored, without wheezes or rales.  ?Heart: ***Regular rate and rhythm. No S3 or S4.  No murmur, no rubs, or gallops appreciated. ?Abdomen: Appears non-distended. No obvious abdominal masses. ?Msk:  Strength and tone appear normal for age. No obvious joint deformities or effusions. ?Extremities: No clubbing or cyanosis. No edema.  Distal pedal pulses are 2+ bilaterally. ?Neuro: Alert and oriented X 3. Moves all extremities spontaneously. No focal deficits noted. ?Psych:   Responds to questions appropriately with a normal affect. ?Skin: No rashes or lesions noted ? ?Wt Readings from Last 3 Encounters:  ?06/22/20 264 lb (119.7 kg)  ?10/04/19 279 lb (126.6 kg)  ?08/01/19 281 lb (127.5 kg)  ?  ? ? ? ? ?Studies/Labs Reviewed:  ? ?EKG:  EKG is*** ordered today.  The ekg ordered today demonstrates *** ? ?Recent Labs: ?No results found for requested labs within last 8760 hours.  ? ?Lipid Panel ?No results found for: CHOL, TRIG, HDL, CHOLHDL, VLDL, LDLCALC, LDLDIRECT ? ?Additional studies/ records that were reviewed today include:  ? ?Echocardiogram: 06/2019 ?IMPRESSIONS  ? ? ? 1. Left ventricular ejection fraction, by estimation, is 70 to 75%. The  ?left ventricle has hyperdynamic function. The left ventricle has no  ?regional wall motion abnormalities. There is moderate left ventricular  ?hypertrophy. Left ventricular diastolic  ?parameters are indeterminate.  ? 2. Right ventricular systolic function is normal. The right ventricular  ?size is normal. Tricuspid regurgitation signal is inadequate for assessing  ?PA pressure.  ? 3. Right atrial size was upper normal.  ? 4. The mitral valve is grossly normal. Trivial mitral valve  ?regurgitation.  ? 5. The aortic valve is tricuspid. Aortic valve regurgitation is not  ?visualized.  ? 6. The inferior vena cava is dilated in size with >50% respiratory  ?variability, suggesting right atrial pressure of 8 mmHg.  ? ?Event Monitor: 08/2019 ?ZIO AT reviewed.  13 days 23 hours analyzed.  Predominant rhythm is sinus with heart rate ranging from 38 bpm up to 136 bpm and average heart rate 73 bpm.  There were rare PACs representing less than 1% of total beats.  Occasional PVCs noted representing 2.8% of total beats.  NSVT observed, longest episode was 5 beats.  Also PSVT noted, longest episode lasted for 25 seconds at a heart rate average of 158 bpm.  There were also multiple pauses observed, the longest of which lasted 7.4 seconds.  Episodes of Wenkebach  conduction also observed.  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 ? ? ?Assessment:   ? ?No diagnosis found. ? ? ?Plan:  ? ?In order of problems listed above: ? ?*** ? ? ? ?Shared Decision Making/Informed Consent:  ? ?{Are you ordering a CV Procedure (e.g. stress test, cath, DCCV, TEE, etc)?   Press F2        :[id number]}  ? ? ?Medication Adjustments/Labs and Tests Ordered: ?Current medicines are reviewed at length with the patient today.  Concerns regarding medicines are outlined above.  Medication changes, Labs and Tests ordered today are listed in the Patient Instructions below. ?There are no Patient Instructions on file for this visit.  ? ?Signed, ?Erma Heritage, PA-C  ?05/04/2021 11:19 AM    ?[facility] ?[address] [place], [place] [address] ?Phone: [phone] ?Fax: [phone]  [phone] ? ? ?

## 2021-05-05 ENCOUNTER — Ambulatory Visit: Payer: Medicare Other | Admitting: Student

## 2021-05-05 DIAGNOSIS — Z1152 Encounter for screening for COVID-19: Secondary | ICD-10-CM | POA: Diagnosis not present

## 2021-05-12 DIAGNOSIS — Z1152 Encounter for screening for COVID-19: Secondary | ICD-10-CM | POA: Diagnosis not present

## 2021-05-14 ENCOUNTER — Other Ambulatory Visit: Payer: Self-pay | Admitting: Cardiology

## 2021-05-14 NOTE — Telephone Encounter (Signed)
Prescription refill request for Eliquis received. ?Indication: afib  ?Last office visit: Bonnell Public 06/22/2020 ?Scr: 0.86, 01/14/2021 ?Age: 71 yo  ?Weight: 119.7 kg ? ?Refill sent.  ?

## 2021-05-17 DIAGNOSIS — I10 Essential (primary) hypertension: Secondary | ICD-10-CM | POA: Diagnosis not present

## 2021-05-17 DIAGNOSIS — R7303 Prediabetes: Secondary | ICD-10-CM | POA: Diagnosis not present

## 2021-05-17 DIAGNOSIS — R809 Proteinuria, unspecified: Secondary | ICD-10-CM | POA: Diagnosis not present

## 2021-05-19 DIAGNOSIS — E785 Hyperlipidemia, unspecified: Secondary | ICD-10-CM | POA: Diagnosis not present

## 2021-05-19 DIAGNOSIS — R809 Proteinuria, unspecified: Secondary | ICD-10-CM | POA: Diagnosis not present

## 2021-05-19 DIAGNOSIS — I10 Essential (primary) hypertension: Secondary | ICD-10-CM | POA: Diagnosis not present

## 2021-05-19 DIAGNOSIS — R7303 Prediabetes: Secondary | ICD-10-CM | POA: Diagnosis not present

## 2021-05-19 DIAGNOSIS — D649 Anemia, unspecified: Secondary | ICD-10-CM | POA: Diagnosis not present

## 2021-05-19 DIAGNOSIS — I4891 Unspecified atrial fibrillation: Secondary | ICD-10-CM | POA: Diagnosis not present

## 2021-05-23 DIAGNOSIS — J9600 Acute respiratory failure, unspecified whether with hypoxia or hypercapnia: Secondary | ICD-10-CM | POA: Diagnosis not present

## 2021-05-27 DIAGNOSIS — Z1152 Encounter for screening for COVID-19: Secondary | ICD-10-CM | POA: Diagnosis not present

## 2021-06-05 DIAGNOSIS — Z1152 Encounter for screening for COVID-19: Secondary | ICD-10-CM | POA: Diagnosis not present

## 2021-06-09 DIAGNOSIS — Z1152 Encounter for screening for COVID-19: Secondary | ICD-10-CM | POA: Diagnosis not present

## 2021-06-11 DIAGNOSIS — Z20822 Contact with and (suspected) exposure to covid-19: Secondary | ICD-10-CM | POA: Diagnosis not present

## 2021-06-22 DIAGNOSIS — J9600 Acute respiratory failure, unspecified whether with hypoxia or hypercapnia: Secondary | ICD-10-CM | POA: Diagnosis not present

## 2021-06-24 DIAGNOSIS — Z1152 Encounter for screening for COVID-19: Secondary | ICD-10-CM | POA: Diagnosis not present

## 2021-06-29 DIAGNOSIS — J309 Allergic rhinitis, unspecified: Secondary | ICD-10-CM | POA: Diagnosis not present

## 2021-06-29 DIAGNOSIS — R059 Cough, unspecified: Secondary | ICD-10-CM | POA: Diagnosis not present

## 2021-07-08 DIAGNOSIS — Z1152 Encounter for screening for COVID-19: Secondary | ICD-10-CM | POA: Diagnosis not present

## 2021-07-15 DIAGNOSIS — Z1152 Encounter for screening for COVID-19: Secondary | ICD-10-CM | POA: Diagnosis not present

## 2021-07-22 DIAGNOSIS — Z20822 Contact with and (suspected) exposure to covid-19: Secondary | ICD-10-CM | POA: Diagnosis not present

## 2021-07-23 DIAGNOSIS — J9600 Acute respiratory failure, unspecified whether with hypoxia or hypercapnia: Secondary | ICD-10-CM | POA: Diagnosis not present

## 2021-07-27 DIAGNOSIS — Z20822 Contact with and (suspected) exposure to covid-19: Secondary | ICD-10-CM | POA: Diagnosis not present

## 2021-08-04 IMAGING — CT CT ANGIO CHEST
2 of 6 series · 18 of 46 positions shown · IV contrast (Omnipaque or Isovue)
Comparison: 07/09/2019

CLINICAL DATA: Shortness of breath, atrial fibrillation with rapid
ventricular response intermittently for 3 weeks, dizziness

EXAM:
CT ANGIOGRAPHY CHEST WITH CONTRAST
TECHNIQUE: Multidetector CT imaging of the chest was performed using the
standard protocol during bolus administration of intravenous
contrast. Multiplanar CT image reconstructions and MIPs were
obtained to evaluate the vascular anatomy.
CONTRAST:  100mL OMNIPAQUE IOHEXOL 350 MG/ML SOLN

[Series 5: pe axial thins · axial · 0.87mm/px · z∈[+1057,+1339]mm · 15 of 310 slices shown]
[im 14/310  lung]
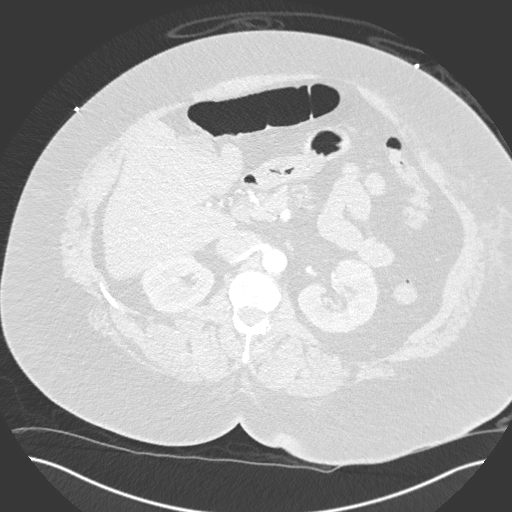
[im 41/310  soft-tissue]
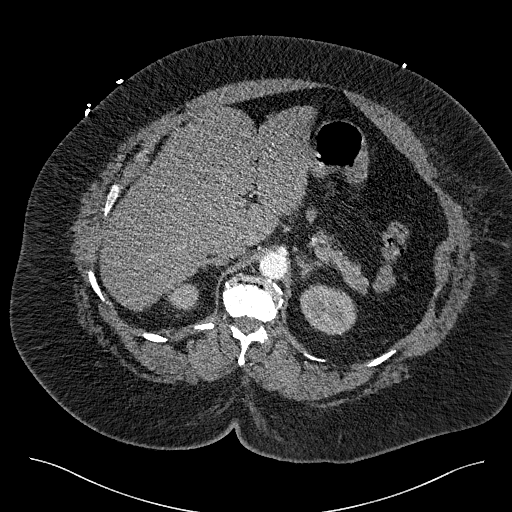
[im 54/310  lung]
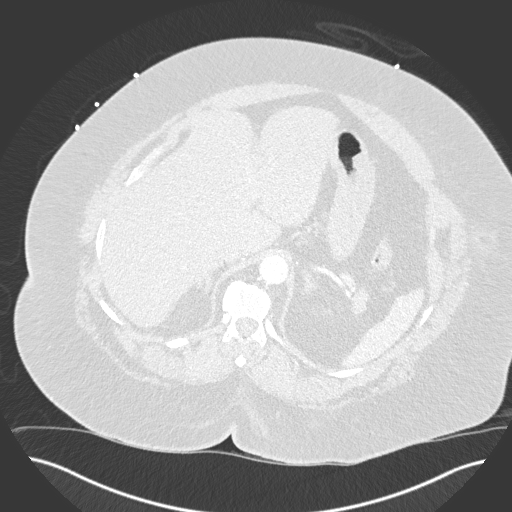
[im 81/310  soft-tissue]
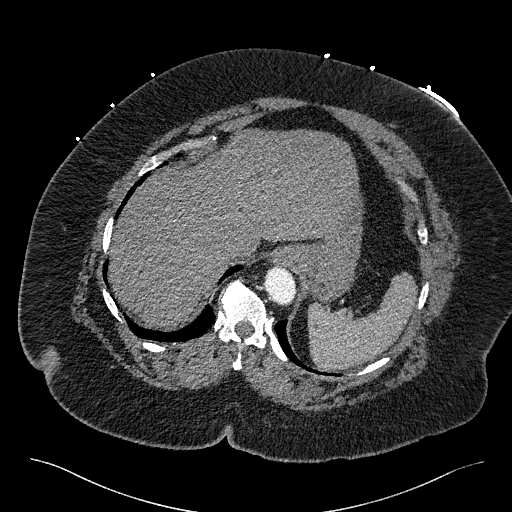
[im 95/310  lung]
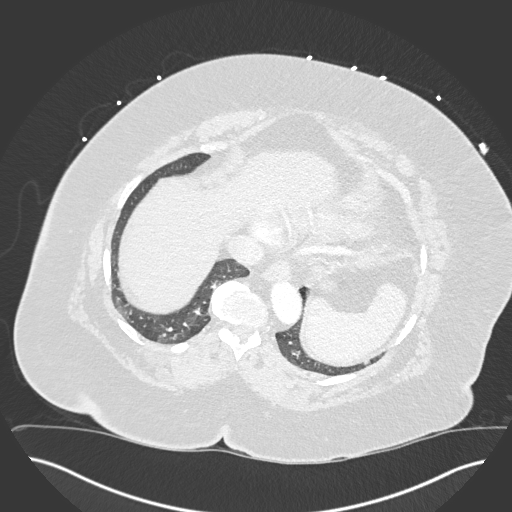
[im 121/310  soft-tissue]
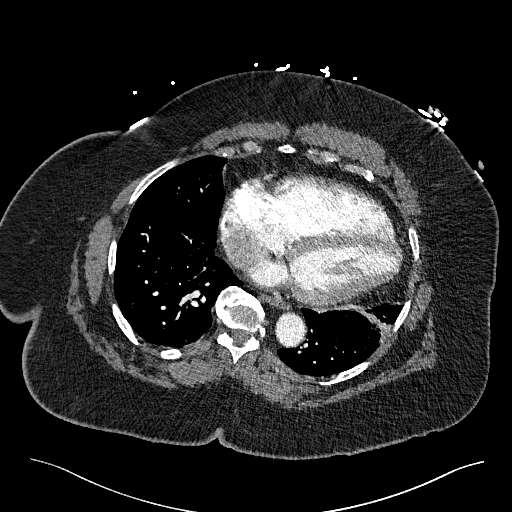
[im 135/310  lung]
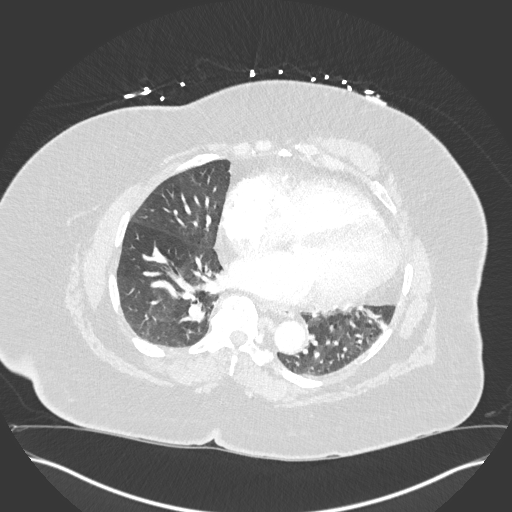
[im 162/310  soft-tissue]
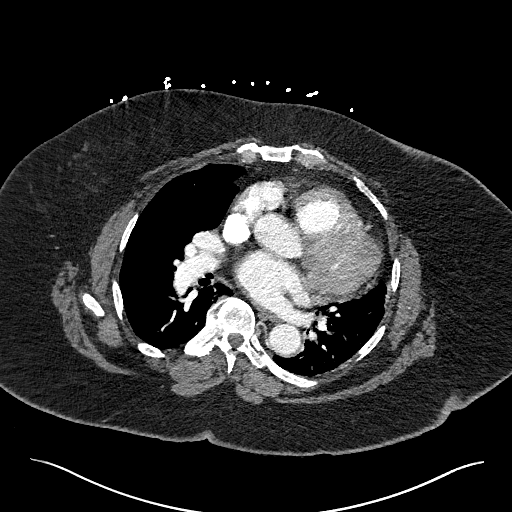
[im 175/310  lung]
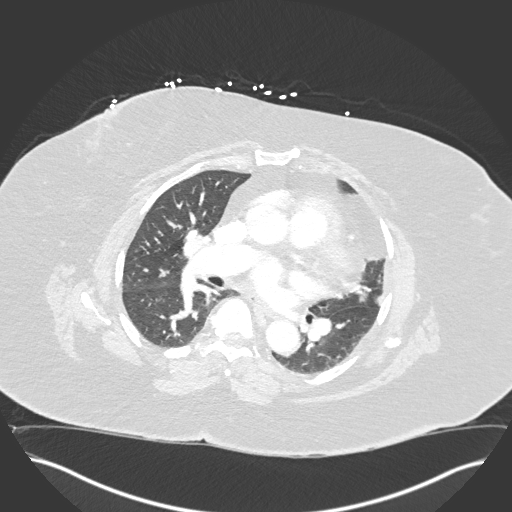
[im 189/310  soft-tissue]
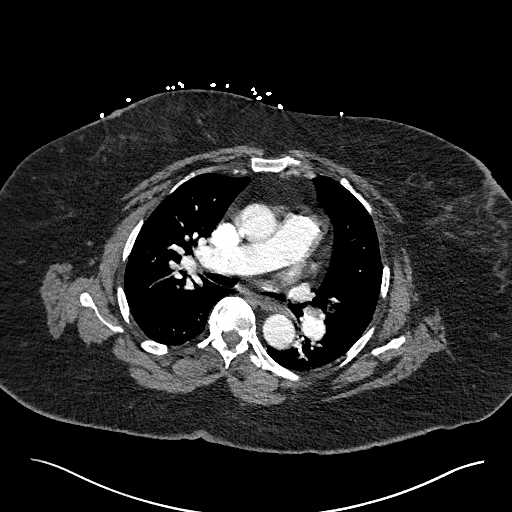
[im 215/310  lung]
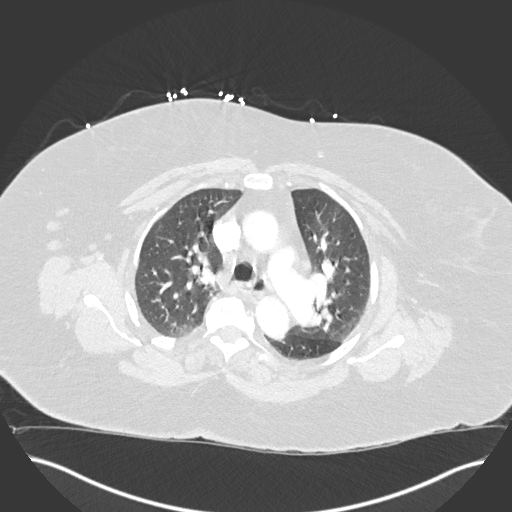
[im 229/310  soft-tissue]
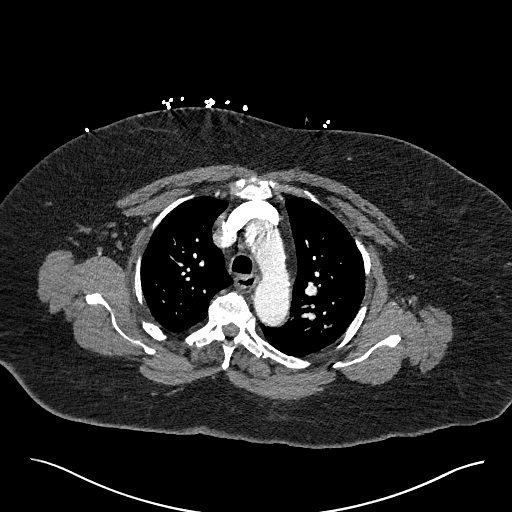
[im 256/310  lung]
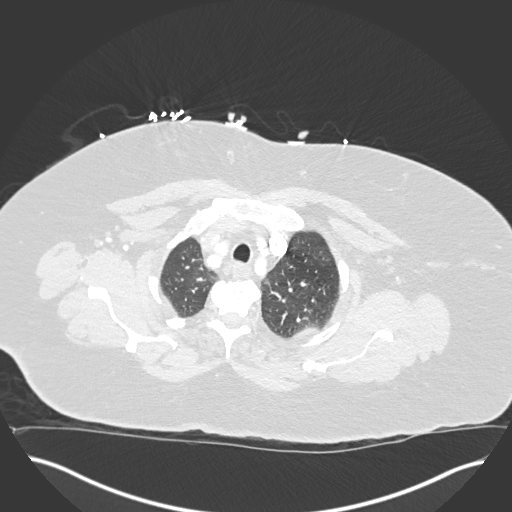
[im 269/310  soft-tissue]
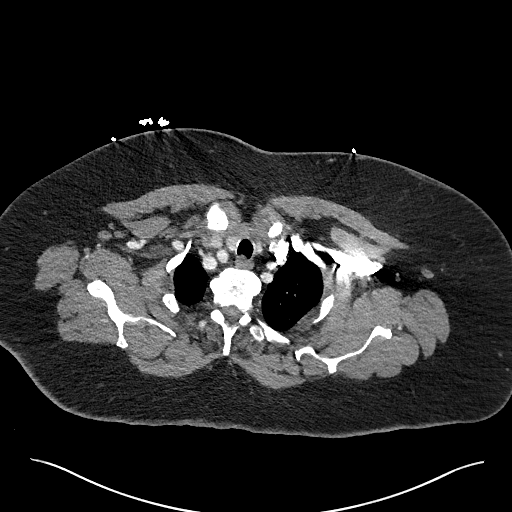
[im 296/310  lung]
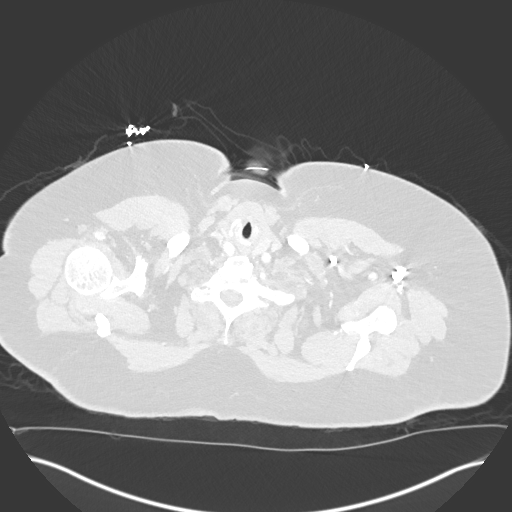

[Series 7: cor soft · coronal · 0.67mm/px · 3 of 184 slices shown]
[im 46/184  soft-tissue]
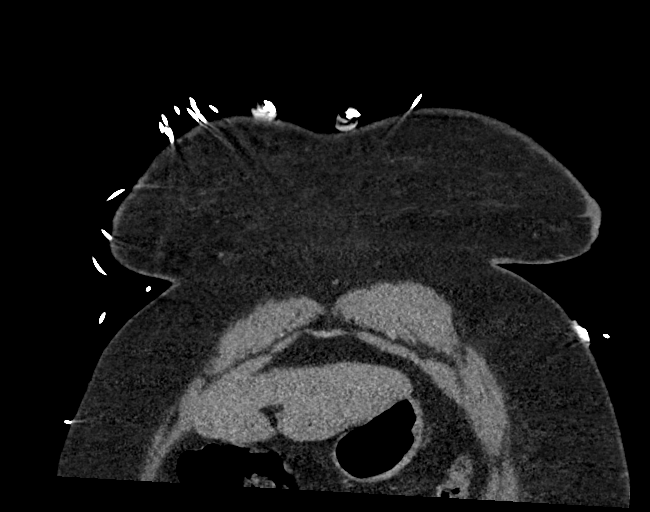
[im 92/184  soft-tissue]
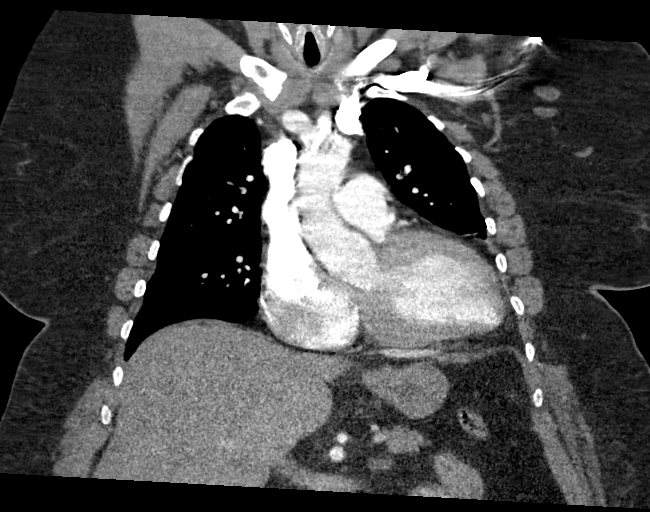
[im 138/184  soft-tissue]
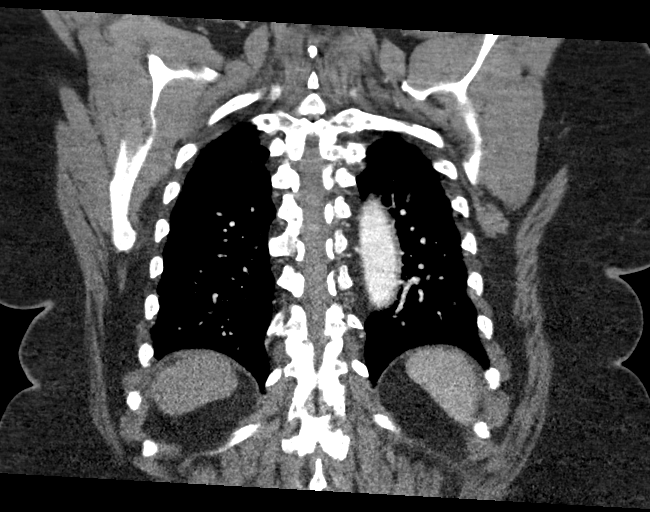

[18 of 46 positions shown; findings below may reference images not displayed]

FINDINGS: Cardiovascular: This is a technically adequate evaluation of the
pulmonary vasculature. No filling defects or pulmonary emboli.

The heart is mildly enlarged. No pericardial effusion. Thoracic
aorta is normal in caliber without evidence of dissection.

Mediastinum/Nodes: No enlarged mediastinal, hilar, or axillary lymph
nodes. Thyroid gland, trachea, and esophagus demonstrate no
significant findings.

Lungs/Pleura: Hypoventilatory changes are seen within the left lower
lobe. No airspace disease, effusion, or pneumothorax. The central
airways are patent.

Upper Abdomen: No acute abnormality.

Musculoskeletal: No acute or destructive bony lesions. There is
prominent spondylosis at T12/L1 with left paracentral disc
osteophyte complex resulting in at least mild central canal
stenosis. Diffuse bridging anterior osteophytes throughout the
entirety of the visualized thoracic spine. Significant disc space
narrowing throughout the mid to upper thoracic spine. Reconstructed
images demonstrate no additional findings.

Review of the MIP images confirms the above findings.
IMPRESSION: 1. No evidence of pulmonary embolus.
2. Extensive thoracic spondylosis.

## 2021-08-04 NOTE — Progress Notes (Deleted)
Cardiology Office Note    Date:  08/04/2021   ID:  LEGACI TARMAN, DOB 1950/05/31, MRN [id number]  PCP:  Celene Squibb, MD  Cardiologist: Rozann Lesches, MD    No chief complaint on file.   History of Present Illness:    EVERLENE CUNNING is a 71 y.o. female with past medical history of persistent atrial fibrillation/flutter (s/p DCCV on 07/11/2019 with recurrent DCCV later that month, prior monitor showing nocturnal pauses and EP did not recommend PPM placement), OSA (on CPAP), HTN, morbid obesity and asthma who presents to the office today for overdue 82-monthfollow-up.  She was last examined by MErmalinda Barrios PA-C in 06/2020 [facility].  Her BP was elevated and Lisinopril was titrated from 20 mg daily to 40 mg daily  - CBC, BMET   Past Medical History:  Diagnosis Date   Asthma    Hypertension    Paroxysmal atrial fibrillation ([facility]     Past Surgical History:  Procedure Laterality Date   CARDIOVERSION N/A 07/11/2019   Procedure: CARDIOVERSION;  Surgeon: MSatira Sark MD;  Location: [facility];  Service: Cardiovascular;  Laterality: N/A;   COLONOSCOPY N/A 09/14/2017   Procedure: COLONOSCOPY;  Surgeon: FDanie Binder MD;  Location: [facility];  Service: Endoscopy;  Laterality: N/A;  2:00   TEE WITHOUT CARDIOVERSION N/A 07/11/2019   Procedure: TRANSESOPHAGEAL ECHOCARDIOGRAM (TEE) WITH PROPOFOL;  Surgeon: MSatira Sark MD;  Location: [facility] ORS;  Service: Cardiovascular;  Laterality: N/A;   TUBAL LIGATION      Current Medications: Outpatient Medications Prior to Visit  Medication Sig Dispense Refill   albuterol (VENTOLIN HFA) 108 (90 Base) MCG/ACT inhaler Inhale 1-2 puffs into the lungs every 6 (six) hours as needed for wheezing or shortness of breath. 18 g 2   ELIQUIS 5 MG TABS tablet TAKE 1 TABLET BY MOUTH TWICE DAILY. 180 tablet 0   lisinopril (ZESTRIL) 40 MG tablet TAKE ONE TABLET BY  MOUTH ONCE DAILY. 90 tablet 2   No facility-administered medications prior to visit.     Allergies:   Strawberry c [ascorbic acid]   Social History   Socioeconomic History   Marital status: Single    Spouse name: Not on file   Number of children: Not on file   Years of education: Not on file   Highest education level: Not on file  Occupational History   Not on file  Tobacco Use   Smoking status: Never   Smokeless tobacco: Never  Vaping Use   Vaping Use: Never used  Substance and Sexual Activity   Alcohol use: Yes    Comment: Occasional   Drug use: No   Sexual activity: Not on file  Other Topics Concern   Not on file  Social History Narrative   Not on file   Social Determinants of Health   Financial Resource Strain: Not on file  Food Insecurity: No Food Insecurity (07/26/2019)   Hunger Vital Sign    Worried About Running Out of Food in the Last Year: Never true    Ran Out of Food in the Last Year: Never true  Transportation Needs: No Transportation Needs (07/26/2019)   PRAPARE - THydrologist(Medical): No    Lack of Transportation (Non-Medical): No  Physical Activity: Not on file  Stress: Not on file  Social Connections: Not on file     Family  History:  The patient's ***family history includes Cirrhosis in her father; Diabetes in her brother and mother; Kidney disease in her sister.   Review of Systems:    Please see the history of present illness.     All other systems reviewed and are otherwise negative except as noted above.   Physical Exam:    VS:  There were no vitals taken for this visit.   General: Well developed, well nourished,female appearing in no acute distress. Head: Normocephalic, atraumatic. Neck: No carotid bruits. JVD not elevated.  Lungs: Respirations regular and unlabored, without wheezes or rales.  Heart: ***Regular rate and rhythm. No S3 or S4.  No murmur, no rubs, or gallops appreciated. Abdomen: Appears  non-distended. No obvious abdominal masses. Msk:  Strength and tone appear normal for age. No obvious joint deformities or effusions. Extremities: No clubbing or cyanosis. No edema.  Distal pedal pulses are 2+ bilaterally. Neuro: Alert and oriented X 3. Moves all extremities spontaneously. No focal deficits noted. Psych:  Responds to questions appropriately with a normal affect. Skin: No rashes or lesions noted  Wt Readings from Last 3 Encounters:  06/22/20 264 lb (119.7 kg)  10/04/19 279 lb (126.6 kg)  08/01/19 281 lb (127.5 kg)        Studies/Labs Reviewed:   EKG:  EKG is*** ordered today.  The ekg ordered today demonstrates ***  Recent Labs: No results found for requested labs within last 365 days.   Lipid Panel No results found for: "CHOL", "TRIG", "HDL", "CHOLHDL", "VLDL", "LDLCALC", "LDLDIRECT"  Additional studies/ records that were reviewed today include:   Echocardiogram: 06/2019 IMPRESSIONS     1. Left ventricular ejection fraction, by estimation, is 70 to 75%. The  left ventricle has hyperdynamic function. The left ventricle has no  regional wall motion abnormalities. There is moderate left ventricular  hypertrophy. Left ventricular diastolic  parameters are indeterminate.   2. Right ventricular systolic function is normal. The right ventricular  size is normal. Tricuspid regurgitation signal is inadequate for assessing  PA pressure.   3. Right atrial size was upper normal.   4. The mitral valve is grossly normal. Trivial mitral valve  regurgitation.   5. The aortic valve is tricuspid. Aortic valve regurgitation is not  visualized.   6. The inferior vena cava is dilated in size with >50% respiratory  variability, suggesting right atrial pressure of 8 mmHg.   Event Monitor: 08/2019 ZIO AT reviewed.  13 days 23 hours analyzed.  Predominant rhythm is sinus with heart rate ranging from 38 bpm up to 136 bpm and average heart rate 73 bpm.  There were rare PACs  representing less than 1% of total beats.  Occasional PVCs noted representing 2.8% of total beats.  NSVT observed, longest episode was 5 beats.  Also PSVT noted, longest episode lasted for 25 seconds at a heart rate average of 158 bpm.  There were also multiple pauses observed, the longest of which lasted 7.4 seconds.  Episodes of Wenkebach conduction also observed.  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  Assessment:    No diagnosis found.   Plan:   In order of problems listed above:  ***    Shared Decision Making/Informed Consent:   {Are you ordering a CV Procedure (e.g. stress test, cath, DCCV, TEE, etc)?   Press F2        :[id number]}    Medication Adjustments/Labs and Tests Ordered: Current medicines are reviewed at length with the patient today.  Concerns regarding medicines  are outlined above.  Medication changes, Labs and Tests ordered today are listed in the Patient Instructions below. There are no Patient Instructions on file for this visit.   Signed, Erma Heritage, PA-C  08/04/2021 11:25 AM    [place] [address] [place], [place] [address] Phone: [phone] Fax: [phone]

## 2021-08-05 ENCOUNTER — Ambulatory Visit: Payer: Medicare Other | Admitting: Student

## 2021-08-07 IMAGING — DX DG CHEST 1V PORT
1 series · 1 of 1 positions shown · non-contrast
Comparison: Chest CT dated 07/09/2019.

CLINICAL DATA: 68-year-old female with heart flutter.

EXAM:
PORTABLE CHEST 1 VIEW

[chest ap grid]
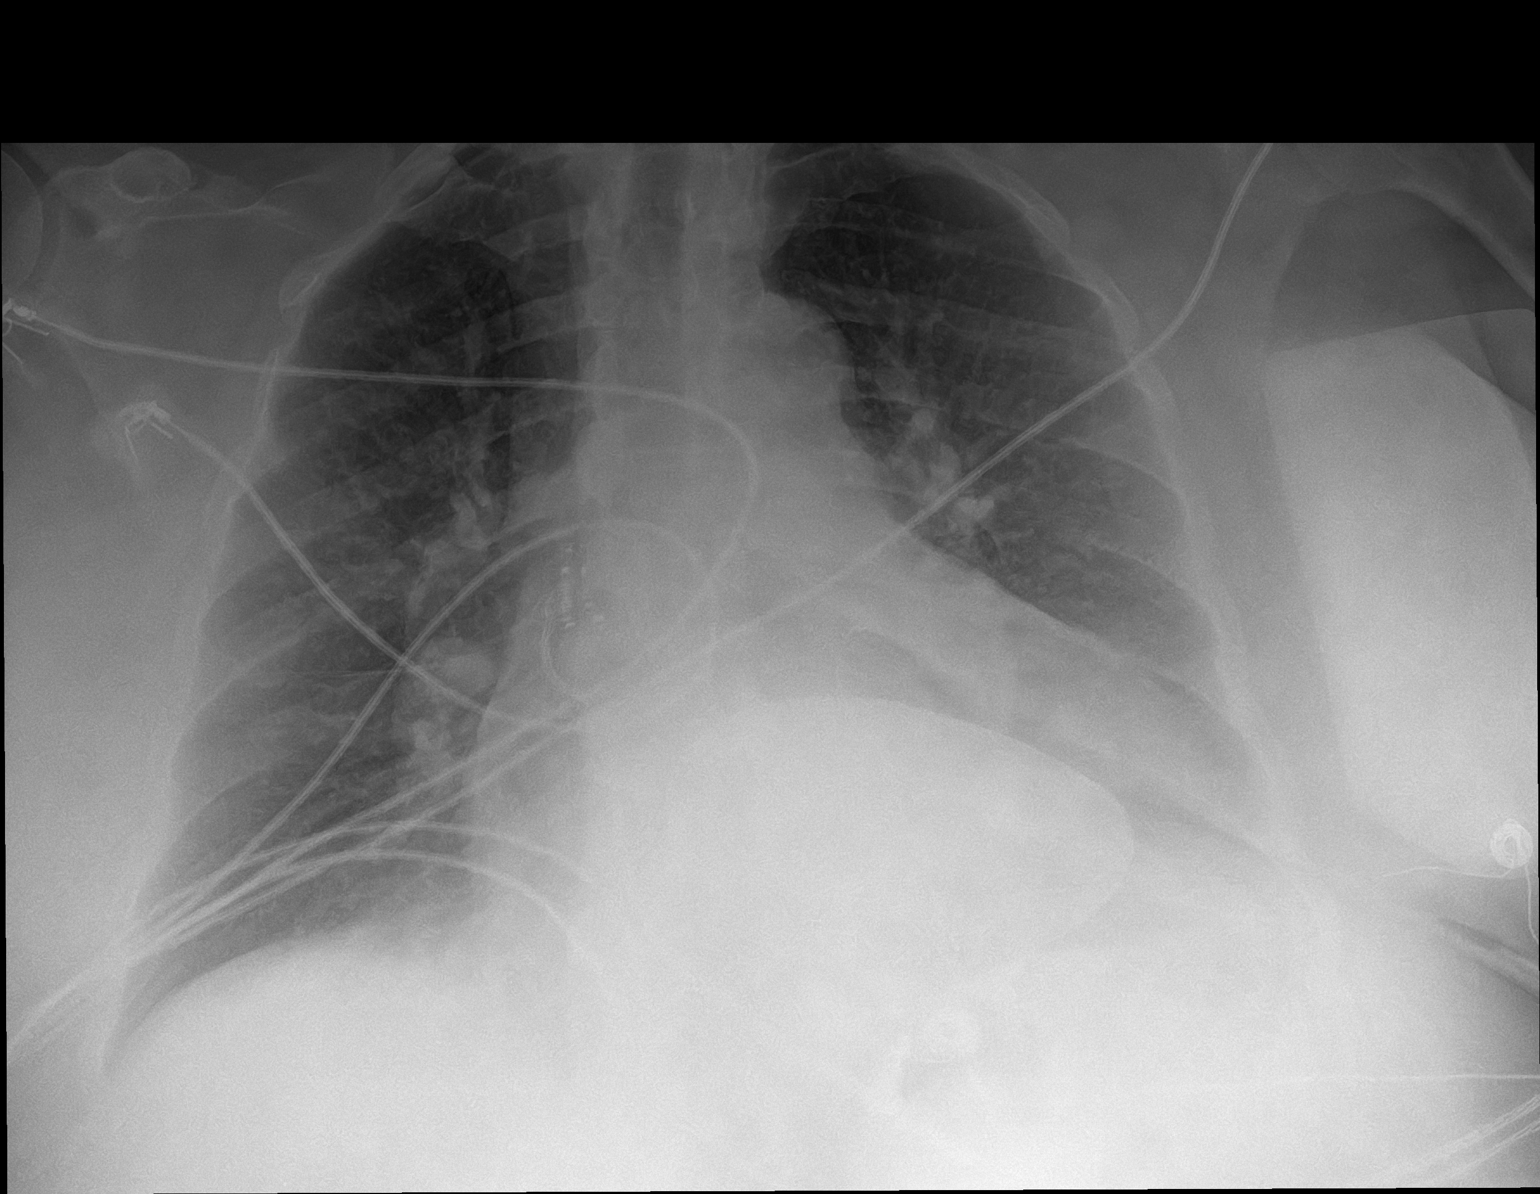

[1 of 1 positions shown; findings below may reference images not displayed]

FINDINGS: Left lung base density may represent atelectasis. No consolidative
changes. There is no large pleural effusion or pneumothorax. There
is mild cardiomegaly. There is prominence of the central pulmonary
vasculature which may represent a degree of pulmonary hypertension.
No acute osseous pathology.
IMPRESSION: Mild cardiomegaly with findings suggestive of pulmonary
hypertension. No focal consolidation.

## 2021-08-22 DIAGNOSIS — J9600 Acute respiratory failure, unspecified whether with hypoxia or hypercapnia: Secondary | ICD-10-CM | POA: Diagnosis not present

## 2021-09-07 ENCOUNTER — Other Ambulatory Visit: Payer: Self-pay | Admitting: Cardiology

## 2021-09-07 NOTE — Telephone Encounter (Signed)
Prescription refill request for Eliquis received. Indication: Atrial Fib Last office visit: 06/22/20  Gerrianne Scale PA-C Scr: 0.77 on 05/17/21 Age:  71 Weight: 119.7kg  Based on above findings Eliquis '5mg'$  twice daily is the appropriate ose.  Pt is apst due for MD appt (Has cancelled or no showed the last 2 scheduled appts).  Message sent to schedulers to make appt.  Refill approved x 1 only.

## 2021-09-22 DIAGNOSIS — J9600 Acute respiratory failure, unspecified whether with hypoxia or hypercapnia: Secondary | ICD-10-CM | POA: Diagnosis not present

## 2021-09-24 DIAGNOSIS — Z1152 Encounter for screening for COVID-19: Secondary | ICD-10-CM | POA: Diagnosis not present

## 2021-10-04 ENCOUNTER — Encounter: Payer: Self-pay | Admitting: Cardiology

## 2021-10-04 ENCOUNTER — Ambulatory Visit: Payer: Medicare Other | Admitting: Cardiology

## 2021-10-04 NOTE — Progress Notes (Deleted)
Cardiology Office Note  Date: 10/04/2021   ID: [name], DOB Jun 14, 1950, MRN [id number]  PCP:  Carolyn Squibb, MD  Cardiologist:  Rozann Lesches, MD Electrophysiologist:  None   No chief complaint on file.   History of Present Illness: Carolyn Silva is a 71 y.o. female last seen in May 2022 by Ms. Vita Barley (I saw her during hospitalization in May 2021).  I reviewed the chart.  She presents for follow-up of paroxysmal atrial fibrillation/flutter and element of sick sinus syndrome status been managed medically so far.  CHA2DS2-VASc score is 3.  Past Medical History:  Diagnosis Date   Asthma    Hypertension    OSA (obstructive sleep apnea)    Paroxysmal atrial fibrillation [facility])     Past Surgical History:  Procedure Laterality Date   CARDIOVERSION N/A 07/11/2019   Procedure: CARDIOVERSION;  Surgeon: Satira Sark, MD;  Location: [facility];  Service: Cardiovascular;  Laterality: N/A;   COLONOSCOPY N/A 09/14/2017   Procedure: COLONOSCOPY;  Surgeon: Danie Binder, MD;  Location[facility];  Service: Endoscopy;  Laterality: N/A;  2:00   TEE WITHOUT CARDIOVERSION N/A 07/11/2019   Procedure: TRANSESOPHAGEAL ECHOCARDIOGRAM (TEE) WITH PROPOFOL;  Surgeon: Satira Sark, MD;  Location: [facility] ORS;  Service: Cardiovascular;  Laterality: N/A;   TUBAL LIGATION      Current Outpatient Medications  Medication Sig Dispense Refill   albuterol (VENTOLIN HFA) 108 (90 Base) MCG/ACT inhaler Inhale 1-2 puffs into the lungs every 6 (six) hours as needed for wheezing or shortness of breath. 18 g 2   apixaban (ELIQUIS) 5 MG TABS tablet Take 1 tablet (5 mg total) by mouth 2 (two) times daily. Pt must see MD before more refills can be given.  Please call office to make appt. 60 tablet 0   lisinopril (ZESTRIL) 40 MG tablet TAKE ONE TABLET BY MOUTH ONCE DAILY. 90 tablet 2   No current facility-administered medications for this visit.   Allergies:  Strawberry c [ascorbic acid]    Social History: The patient  reports that she has never smoked. She has never used smokeless tobacco. She reports current alcohol use. She reports that she does not use drugs.   Family History: The patient's family history includes Cirrhosis in her father; Diabetes in her brother and mother; Kidney disease in her sister.   ROS:  Please see the history of present illness. Otherwise, complete review of systems is positive for {NONE DEFAULTED:18576}.  All other systems are reviewed and negative.   Physical Exam: VS:  There were no vitals taken for this visit., BMI There is no height or weight on file to calculate BMI.  Wt Readings from Last 3 Encounters:  06/22/20 264 lb (119.7 kg)  10/04/19 279 lb (126.6 kg)  08/01/19 281 lb (127.5 kg)    General: Patient appears comfortable at rest. HEENT: Conjunctiva and lids normal, oropharynx clear with moist mucosa. Neck: Supple, no elevated JVP or carotid bruits, no thyromegaly. Lungs: Clear to auscultation, nonlabored breathing at rest. Cardiac: Regular rate and rhythm, no S3 or significant systolic murmur, no pericardial rub. Abdomen: Soft, nontender, no hepatomegaly, bowel sounds present, no guarding or rebound. Extremities: No pitting edema, distal pulses 2+. Skin: Warm and dry. Musculoskeletal: No kyphosis. Neuropsychiatric: Alert and oriented x3, affect grossly appropriate.  ECG:  An ECG dated 06/22/2020 was personally reviewed today and demonstrated:  Sinus rhythm.  Recent Labwork:  No recent lab work for review today.  Other Studies  Reviewed Today:  TEE 07/11/2019:  1. Left ventricular ejection fraction, by estimation, is 65 to 70%. The  left ventricle has normal function. The left ventricle has no regional  wall motion abnormalities. There is mild left ventricular hypertrophy.   2. Right ventricular systolic function is normal. The right ventricular  size is normal.   3. No left atrial/left atrial appendage thrombus was detected.  The LAA  emptying velocity was 40 cm/s.   4. The mitral valve is grossly normal. Trivial mitral valve  regurgitation.   5. The aortic valve is tricuspid. Aortic valve regurgitation is not  visualized.   6. Evidence of atrial level shunting detected by color flow Doppler.  There is a small atrial septal defect with predominantly left to right  shunting across the atrial septum.   Assessment and Plan:    Medication Adjustments/Labs and Tests Ordered: Current medicines are reviewed at length with the patient today.  Concerns regarding medicines are outlined above.   Tests Ordered: No orders of the defined types were placed in this encounter.   Medication Changes: No orders of the defined types were placed in this encounter.   Disposition:  Follow up {follow up:15908}  Signed, Satira Sark, MD, [facility] 10/04/2021 8:36 AM    [facility] at [place], [place], [place] [address] Phone: [phone]; Fax: [phone]

## 2021-10-08 ENCOUNTER — Other Ambulatory Visit: Payer: Self-pay | Admitting: Cardiology

## 2021-10-08 DIAGNOSIS — I480 Paroxysmal atrial fibrillation: Secondary | ICD-10-CM

## 2021-10-08 NOTE — Telephone Encounter (Signed)
Eliquis '5mg'$  refill request received. Patient is 71 years old, weight-119.7kg, Crea-0.77 on 05/17/2021 via [place] from [place], [place], and last seen by Ermalinda Barrios on 06/22/2020 and has an appt for 02/25/2022 with Dr. Domenic Polite. Dose is appropriate based on dosing criteria. Will send in refill to requested pharmacy.

## 2021-10-23 DIAGNOSIS — J9600 Acute respiratory failure, unspecified whether with hypoxia or hypercapnia: Secondary | ICD-10-CM | POA: Diagnosis not present

## 2021-11-22 DIAGNOSIS — R7303 Prediabetes: Secondary | ICD-10-CM | POA: Diagnosis not present

## 2021-11-22 DIAGNOSIS — R809 Proteinuria, unspecified: Secondary | ICD-10-CM | POA: Diagnosis not present

## 2021-11-22 DIAGNOSIS — E785 Hyperlipidemia, unspecified: Secondary | ICD-10-CM | POA: Diagnosis not present

## 2021-11-22 DIAGNOSIS — D649 Anemia, unspecified: Secondary | ICD-10-CM | POA: Diagnosis not present

## 2021-11-22 DIAGNOSIS — J9600 Acute respiratory failure, unspecified whether with hypoxia or hypercapnia: Secondary | ICD-10-CM | POA: Diagnosis not present

## 2021-11-24 DIAGNOSIS — Z23 Encounter for immunization: Secondary | ICD-10-CM | POA: Diagnosis not present

## 2021-11-24 DIAGNOSIS — R809 Proteinuria, unspecified: Secondary | ICD-10-CM | POA: Diagnosis not present

## 2021-11-24 DIAGNOSIS — I10 Essential (primary) hypertension: Secondary | ICD-10-CM | POA: Diagnosis not present

## 2021-11-24 DIAGNOSIS — R7303 Prediabetes: Secondary | ICD-10-CM | POA: Diagnosis not present

## 2021-11-24 DIAGNOSIS — R252 Cramp and spasm: Secondary | ICD-10-CM | POA: Diagnosis not present

## 2021-11-24 DIAGNOSIS — D649 Anemia, unspecified: Secondary | ICD-10-CM | POA: Diagnosis not present

## 2021-11-24 DIAGNOSIS — E785 Hyperlipidemia, unspecified: Secondary | ICD-10-CM | POA: Diagnosis not present

## 2021-11-24 DIAGNOSIS — I4891 Unspecified atrial fibrillation: Secondary | ICD-10-CM | POA: Diagnosis not present

## 2021-11-24 DIAGNOSIS — Z0000 Encounter for general adult medical examination without abnormal findings: Secondary | ICD-10-CM | POA: Diagnosis not present

## 2021-12-23 DIAGNOSIS — J9600 Acute respiratory failure, unspecified whether with hypoxia or hypercapnia: Secondary | ICD-10-CM | POA: Diagnosis not present

## 2022-01-22 DIAGNOSIS — J9600 Acute respiratory failure, unspecified whether with hypoxia or hypercapnia: Secondary | ICD-10-CM | POA: Diagnosis not present

## 2022-02-22 DIAGNOSIS — J9600 Acute respiratory failure, unspecified whether with hypoxia or hypercapnia: Secondary | ICD-10-CM | POA: Diagnosis not present

## 2022-02-25 ENCOUNTER — Encounter: Payer: Self-pay | Admitting: Cardiology

## 2022-02-25 ENCOUNTER — Ambulatory Visit: Payer: Medicare Other | Admitting: Cardiology

## 2022-02-25 NOTE — Progress Notes (Deleted)
Cardiology Office Note  Date: 02/25/2022   ID: CARLEENA NEUPERT, DOB 1950/09/14, MRN [id number]  PCP:  Celene Squibb, MD  Cardiologist:  Rozann Lesches, MD Electrophysiologist:  None   No chief complaint on file.   History of Present Illness: Carolyn Silva is a 72 y.o. female last seen in the office back in May 2022 by Ms. Bonnell Public PA-C.  She presents overdue for follow-up.  She was evaluated by Dr. Rayann Heman back in 2021 for nocturnal pauses in the setting of paroxysmal atrial fibrillation and atrial flutter.  He did not feel that pacemaker was indicated.  She has been on CPAP for OSA, no reported syncope.  She also declined antiarrhythmic therapy.  Past Medical History:  Diagnosis Date   Asthma    Hypertension    OSA (obstructive sleep apnea)    Paroxysmal atrial fibrillation ([facility])     Current Outpatient Medications  Medication Sig Dispense Refill   albuterol (VENTOLIN HFA) 108 (90 Base) MCG/ACT inhaler Inhale 1-2 puffs into the lungs every 6 (six) hours as needed for wheezing or shortness of breath. 18 g 2   apixaban (ELIQUIS) 5 MG TABS tablet TAKE 1 TABLET BY MOUTH TWICE DAILY. 60 tablet 5   lisinopril (ZESTRIL) 40 MG tablet TAKE ONE TABLET BY MOUTH ONCE DAILY. 90 tablet 2   No current facility-administered medications for this visit.   Allergies:  Strawberry c [ascorbic acid]   ROS:  Please see the history of present illness. Otherwise, complete review of systems is positive for {NONE DEFAULTED:18576}.  All other systems are reviewed and negative.   Physical Exam: VS:  There were no vitals taken for this visit., BMI There is no height or weight on file to calculate BMI.  Wt Readings from Last 3 Encounters:  06/22/20 264 lb (119.7 kg)  10/04/19 279 lb (126.6 kg)  08/01/19 281 lb (127.5 kg)    General: Patient appears comfortable at rest. HEENT: Conjunctiva and lids normal, oropharynx clear with moist mucosa. Neck: Supple, no elevated JVP or carotid bruits, no  thyromegaly. Lungs: Clear to auscultation, nonlabored breathing at rest. Cardiac: Regular rate and rhythm, no S3 or significant systolic murmur, no pericardial rub. Abdomen: Soft, nontender, no hepatomegaly, bowel sounds present, no guarding or rebound. Extremities: No pitting edema, distal pulses 2+. Skin: Warm and dry. Musculoskeletal: No kyphosis. Neuropsychiatric: Alert and oriented x3, affect grossly appropriate.  ECG:  An ECG dated 06/22/2020 was personally reviewed today and demonstrated:  Sinus rhythm.  Recent Labwork:  October 2023: Hemoglobin 10.6, platelets 290, BUN 13, creatinine 0.81, potassium 4.8, AST 27, ALT 16, cholesterol 162, triglycerides 101, HDL 52, LDL 92, hemoglobin A1c 5.6%  Other Studies Reviewed Today:  Echocardiogram 07/10/2019:  1. Left ventricular ejection fraction, by estimation, is 70 to 75%. The  left ventricle has hyperdynamic function. The left ventricle has no  regional wall motion abnormalities. There is moderate left ventricular  hypertrophy. Left ventricular diastolic  parameters are indeterminate.   2. Right ventricular systolic function is normal. The right ventricular  size is normal. Tricuspid regurgitation signal is inadequate for assessing  PA pressure.   3. Right atrial size was upper normal.   4. The mitral valve is grossly normal. Trivial mitral valve  regurgitation.   5. The aortic valve is tricuspid. Aortic valve regurgitation is not  visualized.   6. The inferior vena cava is dilated in size with >50% respiratory  variability, suggesting right atrial pressure of 8 mmHg.    Assessment  and Plan:   Medication Adjustments/Labs and Tests Ordered: Current medicines are reviewed at length with the patient today.  Concerns regarding medicines are outlined above.   Tests Ordered: No orders of the defined types were placed in this encounter.   Medication Changes: No orders of the defined types were placed in this  encounter.   Disposition:  Follow up {follow up:15908}  Signed, Satira Sark, MD, [facility] 02/25/2022 8:50 AM    [facility] at [facility] [address] [place], [place] [address] Phone: [phone]; Fax: [phone]

## 2022-03-15 ENCOUNTER — Other Ambulatory Visit: Payer: Self-pay | Admitting: Cardiology

## 2022-03-15 DIAGNOSIS — I480 Paroxysmal atrial fibrillation: Secondary | ICD-10-CM

## 2022-03-16 NOTE — Telephone Encounter (Signed)
Prescription refill request for Eliquis received. Indication: AF Last office visit: 06/22/20  Gerrianne Scale PA-C Scr: 0.81 on 11/22/21 Age: 72 Weight: 119.7kg  Based on above findings Eliquis '5mg'$  twice daily is the appropriate dose.  Pt is past due for appt with Dr Domenic Polite.  Message sent to [place] to make appt.  Refill approved x 1 only.

## 2022-03-25 DIAGNOSIS — J9600 Acute respiratory failure, unspecified whether with hypoxia or hypercapnia: Secondary | ICD-10-CM | POA: Diagnosis not present

## 2022-03-25 DIAGNOSIS — R062 Wheezing: Secondary | ICD-10-CM | POA: Diagnosis not present

## 2022-03-25 DIAGNOSIS — J069 Acute upper respiratory infection, unspecified: Secondary | ICD-10-CM | POA: Diagnosis not present

## 2022-03-25 DIAGNOSIS — J45909 Unspecified asthma, uncomplicated: Secondary | ICD-10-CM | POA: Diagnosis not present

## 2022-03-26 ENCOUNTER — Encounter (HOSPITAL_COMMUNITY): Payer: Self-pay

## 2022-03-26 ENCOUNTER — Emergency Department (HOSPITAL_COMMUNITY): Payer: 59

## 2022-03-26 ENCOUNTER — Inpatient Hospital Stay (HOSPITAL_COMMUNITY)
Admission: EM | Admit: 2022-03-26 | Discharge: 2022-03-29 | DRG: 202 | Disposition: A | Discharge status: Home / Self Care | Payer: 59 | Attending: Internal Medicine | Admitting: Internal Medicine

## 2022-03-26 ENCOUNTER — Other Ambulatory Visit: Payer: Self-pay

## 2022-03-26 DIAGNOSIS — J45909 Unspecified asthma, uncomplicated: Secondary | ICD-10-CM | POA: Diagnosis not present

## 2022-03-26 DIAGNOSIS — R062 Wheezing: Secondary | ICD-10-CM | POA: Diagnosis not present

## 2022-03-26 DIAGNOSIS — J4541 Moderate persistent asthma with (acute) exacerbation: Secondary | ICD-10-CM | POA: Diagnosis not present

## 2022-03-26 DIAGNOSIS — Z1152 Encounter for screening for COVID-19: Secondary | ICD-10-CM | POA: Diagnosis not present

## 2022-03-26 DIAGNOSIS — D649 Anemia, unspecified: Secondary | ICD-10-CM | POA: Diagnosis present

## 2022-03-26 DIAGNOSIS — Z841 Family history of disorders of kidney and ureter: Secondary | ICD-10-CM | POA: Diagnosis not present

## 2022-03-26 DIAGNOSIS — I10 Essential (primary) hypertension: Secondary | ICD-10-CM | POA: Diagnosis not present

## 2022-03-26 DIAGNOSIS — R0789 Other chest pain: Secondary | ICD-10-CM | POA: Diagnosis not present

## 2022-03-26 DIAGNOSIS — J210 Acute bronchiolitis due to respiratory syncytial virus: Secondary | ICD-10-CM | POA: Diagnosis present

## 2022-03-26 DIAGNOSIS — B338 Other specified viral diseases: Secondary | ICD-10-CM | POA: Diagnosis not present

## 2022-03-26 DIAGNOSIS — E876 Hypokalemia: Secondary | ICD-10-CM | POA: Diagnosis not present

## 2022-03-26 DIAGNOSIS — R9431 Abnormal electrocardiogram [ECG] [EKG]: Secondary | ICD-10-CM | POA: Diagnosis not present

## 2022-03-26 DIAGNOSIS — Z79899 Other long term (current) drug therapy: Secondary | ICD-10-CM | POA: Diagnosis not present

## 2022-03-26 DIAGNOSIS — Z6841 Body Mass Index (BMI) 40.0 and over, adult: Secondary | ICD-10-CM | POA: Diagnosis not present

## 2022-03-26 DIAGNOSIS — Z9989 Dependence on other enabling machines and devices: Secondary | ICD-10-CM | POA: Diagnosis not present

## 2022-03-26 DIAGNOSIS — Z833 Family history of diabetes mellitus: Secondary | ICD-10-CM | POA: Diagnosis not present

## 2022-03-26 DIAGNOSIS — Z7901 Long term (current) use of anticoagulants: Secondary | ICD-10-CM | POA: Diagnosis not present

## 2022-03-26 DIAGNOSIS — G4733 Obstructive sleep apnea (adult) (pediatric): Secondary | ICD-10-CM | POA: Diagnosis present

## 2022-03-26 DIAGNOSIS — R0602 Shortness of breath: Secondary | ICD-10-CM | POA: Diagnosis not present

## 2022-03-26 DIAGNOSIS — I480 Paroxysmal atrial fibrillation: Secondary | ICD-10-CM | POA: Diagnosis present

## 2022-03-26 DIAGNOSIS — J45901 Unspecified asthma with (acute) exacerbation: Secondary | ICD-10-CM | POA: Diagnosis present

## 2022-03-26 DIAGNOSIS — J9601 Acute respiratory failure with hypoxia: Principal | ICD-10-CM

## 2022-03-26 LAB — IRON AND TIBC
Iron: 13 ug/dL — ABNORMAL LOW (ref 28–170)
Saturation Ratios: 3 % — ABNORMAL LOW (ref 10.4–31.8)
TIBC: 458 ug/dL — ABNORMAL HIGH (ref 250–450)
UIBC: 445 ug/dL

## 2022-03-26 LAB — RESP PANEL BY RT-PCR (RSV, FLU A&B, COVID)  RVPGX2
Influenza A by PCR: NEGATIVE
Influenza B by PCR: NEGATIVE
Resp Syncytial Virus by PCR: POSITIVE — AB
SARS Coronavirus 2 by RT PCR: NEGATIVE

## 2022-03-26 LAB — BLOOD GAS, ARTERIAL
Acid-Base Excess: 1.9 mmol/L (ref 0.0–2.0)
Bicarbonate: 26.6 mmol/L (ref 20.0–28.0)
Drawn by: 27016
FIO2: 21 %
O2 Saturation: 96.4 %
Patient temperature: 37
pCO2 arterial: 41 mmHg (ref 32–48)
pH, Arterial: 7.42 (ref 7.35–7.45)
pO2, Arterial: 68 mmHg — ABNORMAL LOW (ref 83–108)

## 2022-03-26 LAB — CBC WITH DIFFERENTIAL/PLATELET
Abs Immature Granulocytes: 0.02 10*3/uL (ref 0.00–0.07)
Basophils Absolute: 0 10*3/uL (ref 0.0–0.1)
Basophils Relative: 0 %
Eosinophils Absolute: 0.1 10*3/uL (ref 0.0–0.5)
Eosinophils Relative: 1 %
HCT: 30.3 % — ABNORMAL LOW (ref 36.0–46.0)
Hemoglobin: 9 g/dL — ABNORMAL LOW (ref 12.0–15.0)
Immature Granulocytes: 0 %
Lymphocytes Relative: 16 %
Lymphs Abs: 1.1 10*3/uL (ref 0.7–4.0)
MCH: 24.1 pg — ABNORMAL LOW (ref 26.0–34.0)
MCHC: 29.7 g/dL — ABNORMAL LOW (ref 30.0–36.0)
MCV: 81.2 fL (ref 80.0–100.0)
Monocytes Absolute: 0.7 10*3/uL (ref 0.1–1.0)
Monocytes Relative: 10 %
Neutro Abs: 5 10*3/uL (ref 1.7–7.7)
Neutrophils Relative %: 73 %
Platelets: 277 10*3/uL (ref 150–400)
RBC: 3.73 MIL/uL — ABNORMAL LOW (ref 3.87–5.11)
RDW: 16.1 % — ABNORMAL HIGH (ref 11.5–15.5)
WBC: 7 10*3/uL (ref 4.0–10.5)
nRBC: 0 % (ref 0.0–0.2)

## 2022-03-26 LAB — RETICULOCYTES
Immature Retic Fract: 17.7 % — ABNORMAL HIGH (ref 2.3–15.9)
RBC.: 3.65 MIL/uL — ABNORMAL LOW (ref 3.87–5.11)
Retic Count, Absolute: 51.1 10*3/uL (ref 19.0–186.0)
Retic Ct Pct: 1.4 % (ref 0.4–3.1)

## 2022-03-26 LAB — BASIC METABOLIC PANEL
Anion gap: 9 (ref 5–15)
BUN: 9 mg/dL (ref 8–23)
CO2: 25 mmol/L (ref 22–32)
Calcium: 8.4 mg/dL — ABNORMAL LOW (ref 8.9–10.3)
Chloride: 104 mmol/L (ref 98–111)
Creatinine, Ser: 0.86 mg/dL (ref 0.44–1.00)
GFR, Estimated: >60 mL/min (ref >60)
Glucose, Bld: 108 mg/dL — ABNORMAL HIGH (ref 70–99)
Potassium: 3.4 mmol/L — ABNORMAL LOW (ref 3.5–5.1)
Sodium: 138 mmol/L (ref 135–145)

## 2022-03-26 LAB — TYPE AND SCREEN
ABO/RH(D): O POS
Antibody Screen: NEGATIVE

## 2022-03-26 LAB — VITAMIN B12: Vitamin B-12: 410 pg/mL (ref 180–914)

## 2022-03-26 LAB — APTT: aPTT: 29 seconds (ref 24–36)

## 2022-03-26 LAB — FERRITIN: Ferritin: 9 ng/mL — ABNORMAL LOW (ref 11–307)

## 2022-03-26 LAB — PROCALCITONIN: Procalcitonin: <0.1 ng/mL

## 2022-03-26 LAB — BRAIN NATRIURETIC PEPTIDE: B Natriuretic Peptide: 43 pg/mL (ref 0.0–100.0)

## 2022-03-26 LAB — FOLATE: Folate: 11.9 ng/mL (ref >5.9)

## 2022-03-26 MED ORDER — ALBUTEROL (5 MG/ML) CONTINUOUS INHALATION SOLN
10.0000 mg/h | INHALATION_SOLUTION | Freq: Once | RESPIRATORY_TRACT | Status: AC
  Administered 2022-03-26: 10 mg/h via RESPIRATORY_TRACT
  Filled 2022-03-26: qty 20

## 2022-03-26 MED ORDER — METHYLPREDNISOLONE SODIUM SUCC 125 MG IJ SOLR
80.0000 mg | Freq: Two times a day (BID) | INTRAMUSCULAR | Status: DC
  Administered 2022-03-27 (×2): 80 mg via INTRAVENOUS
  Filled 2022-03-26 (×2): qty 2

## 2022-03-26 MED ORDER — IPRATROPIUM-ALBUTEROL 0.5-2.5 (3) MG/3ML IN SOLN: 3.0000 mL | Freq: Once | RESPIRATORY_TRACT | Status: DC

## 2022-03-26 MED ORDER — ALBUTEROL SULFATE (2.5 MG/3ML) 0.083% IN NEBU
2.5000 mg | INHALATION_SOLUTION | Freq: Once | RESPIRATORY_TRACT | Status: AC
  Administered 2022-03-26: 2.5 mg via RESPIRATORY_TRACT
  Filled 2022-03-26: qty 3

## 2022-03-26 MED ORDER — APIXABAN 5 MG PO TABS
5.0000 mg | ORAL_TABLET | Freq: Two times a day (BID) | ORAL | Status: DC
  Administered 2022-03-26 – 2022-03-29 (×6): 5 mg via ORAL
  Filled 2022-03-26 (×6): qty 1

## 2022-03-26 MED ORDER — IPRATROPIUM-ALBUTEROL 0.5-2.5 (3) MG/3ML IN SOLN
3.0000 mL | Freq: Once | RESPIRATORY_TRACT | Status: AC
  Administered 2022-03-26: 3 mL via RESPIRATORY_TRACT
  Filled 2022-03-26: qty 3

## 2022-03-26 MED ORDER — ACETAMINOPHEN 325 MG PO TABS: 650.0000 mg | ORAL_TABLET | Freq: Four times a day (QID) | ORAL | Status: DC | PRN

## 2022-03-26 MED ORDER — MENTHOL 3 MG MT LOZG
1.0000 | LOZENGE | OROMUCOSAL | Status: DC | PRN
  Administered 2022-03-28: 3 mg via ORAL
  Filled 2022-03-26: qty 9

## 2022-03-26 MED ORDER — ALBUTEROL SULFATE HFA 108 (90 BASE) MCG/ACT IN AERS
2.0000 | INHALATION_SPRAY | RESPIRATORY_TRACT | Status: DC | PRN
  Administered 2022-03-26 – 2022-03-27 (×2): 2 via RESPIRATORY_TRACT
  Filled 2022-03-26: qty 6.7

## 2022-03-26 MED ORDER — ACETAMINOPHEN 650 MG RE SUPP: 650.0000 mg | Freq: Four times a day (QID) | RECTAL | Status: DC | PRN

## 2022-03-26 MED ORDER — POTASSIUM CHLORIDE CRYS ER 20 MEQ PO TBCR
40.0000 meq | EXTENDED_RELEASE_TABLET | Freq: Once | ORAL | Status: AC
  Administered 2022-03-26: 40 meq via ORAL
  Filled 2022-03-26: qty 2

## 2022-03-26 MED ORDER — MAGNESIUM SULFATE 2 GM/50ML IV SOLN
2.0000 g | Freq: Once | INTRAVENOUS | Status: AC
  Administered 2022-03-26: 2 g via INTRAVENOUS
  Filled 2022-03-26: qty 50

## 2022-03-26 MED ORDER — LISINOPRIL 10 MG PO TABS
40.0000 mg | ORAL_TABLET | Freq: Every day | ORAL | Status: DC
  Administered 2022-03-27 – 2022-03-29 (×3): 40 mg via ORAL
  Filled 2022-03-26 (×3): qty 4

## 2022-03-26 MED ORDER — IPRATROPIUM-ALBUTEROL 0.5-2.5 (3) MG/3ML IN SOLN
3.0000 mL | Freq: Four times a day (QID) | RESPIRATORY_TRACT | Status: DC
  Administered 2022-03-26 – 2022-03-28 (×8): 3 mL via RESPIRATORY_TRACT
  Filled 2022-03-26 (×8): qty 3

## 2022-03-26 MED ORDER — MELATONIN 3 MG PO TABS: 3.0000 mg | ORAL_TABLET | Freq: Every evening | ORAL | Status: DC | PRN

## 2022-03-26 MED ORDER — BENZONATATE 100 MG PO CAPS
200.0000 mg | ORAL_CAPSULE | Freq: Three times a day (TID) | ORAL | Status: DC | PRN
  Administered 2022-03-27: 200 mg via ORAL
  Filled 2022-03-26: qty 2

## 2022-03-26 MED ORDER — METHYLPREDNISOLONE SODIUM SUCC 125 MG IJ SOLR
125.0000 mg | Freq: Once | INTRAMUSCULAR | Status: AC
  Administered 2022-03-26: 125 mg via INTRAVENOUS
  Filled 2022-03-26: qty 2

## 2022-03-26 NOTE — H&P (Signed)
History and Physical      Carolyn Silva [id number] DOB: 11-04-1950 DOA: 03/26/2022  PCP: Celene Squibb, MD  Patient coming from: home   I have personally briefly reviewed patient's old medical records in [place]  Chief Complaint: Shortness of breath  HPI: Carolyn Silva is a 72 y.o. female with medical history significant for moderate persistent asthma, paroxysmal atrial fibrillation chronically anticoagulated on Eliquis, essential hypertension, chronic anemia associated basal hemoglobin 11-12, who is admitted to [facility] on 03/26/2022 with acute asthma exacerbation in the setting of RSV infection after presenting from home to [facility] ED complaining of shortness of breath.   The patient reports to 3 days of progressive shortness of breath associated with new onset nonproductive cough, wheezing, as well as subjective fever and generalized myalgias in the absence of chills or full body rigors.  Her shortness of breath has not been associated with any orthopnea, PND, or worsening peripheral edema.  She also denies any associated new onset calf tenderness or new lower extremity erythema.  No recent chest pain, palpitations, diaphoresis, nausea, vomiting, dizziness, presyncope, or syncope.  No recent trauma or travel.  Confirms an underlying diagnosis of asthma, denies any baseline supplemental oxygen requirements.  Over the last 2 days, she has been using her prn albuterol inhaler with increasing frequency, but notes no significant improvement in her shortness of breath or wheezing with this measure.  She went to her PCP yesterday (2/9), with the above complaints, reportedly diagnosed her with an upper respiratory infection.  She states that she received an IM dose of either an antibiotic or steroid in the PCPs office, although she is unsure as to the specific nature of this injection.  However, in the interval since this PCP appointment yesterday, she denies further progression  in her shortness of breath, prompting her to present to independent urgency department seeking further evaluation and management thereof.  Her medical history is also notable for chronic anemia, with chart review revealing baseline hemoglobin range of 11-12, with most recent prior hemoglobin noted to be 11.8 in June 2021.  Denies any recent abdominal pain, melena, hematochezia.  No recent dysuria or gross hematuria.  She is chronically anticoagulated on Eliquis in the setting of a history of paroxysmal atrial fibrillation, with most recent echocardiogram, performed in May 2021, notable for LVEF 77 5%, no focal motion rise, moderate LVH, indeterminate diastolic parameters, normal right ventricular systolic function, intravelar regurgitation.    ED Course:  Vital signs in the ED were notable for the following: Afebrile; heart rate [id number]; systolic pressures in the 140s to 160s; respiratory rate 20-20, oxygen saturation 92 to 96% on room air.  Labs were notable for the following: ABG notable for the following: 7.4 1/41/68/20 6.6.  BMP notable for sodium 130, potassium 5.4, bicarbonate 25, BUN 9.  CBC notable for white cell count 7000, hemoglobin 9.0 associated with normocytic finding as well as hypochromic result and mildly elevated RDW, platelet count 277.  RSV PCR positive, while COVID-19/influenza PCR were negative.  Per my interpretation, EKG in ED demonstrated the following: Sinus rhythm with heart rate 68, prolonged QTc of 578, and no evidence of T wave or ST changes, clearly no evidence of ST elevation.  Imaging and additional notable ED work-up: 2 view chest x-ray, per formal radiology read, showed no evidence of acute cardiopulmonary process, including no evidence of infiltrate, edema, effusion, or pneumothorax.  While in the ED, the following were administered: Prn butyryl inhaler  x 1, continuous albuterol nebulizer, additional albuterol nebulizer, duo nebulizer, Solu-Medrol 125 mg IV x 1,  potassium chloride 40 mill colons p.o. x 1, magnesium sulfate 2 g IV over 2 hours.  Subsequently, the patient was admitted for further evaluation and management acute asthma exacerbation in the setting of RSV infection, with presentation also notable for QTc prolongation and laboratory abnormalities that include hypokalemia as well as acute on chronic anemia.     Review of Systems: As per HPI otherwise 10 point review of systems negative.   Past Medical History:  Diagnosis Date   Asthma    Hypertension    OSA (obstructive sleep apnea)    Paroxysmal atrial fibrillation (HCC)    Paroxysmal atrial flutter [facility])     Past Surgical History:  Procedure Laterality Date   CARDIOVERSION N/A 07/11/2019   Procedure: CARDIOVERSION;  Surgeon: Satira Sark, MD;  Location: [facility] ORS;  Service: Cardiovascular;  Laterality: N/A;   COLONOSCOPY N/A 09/14/2017   Procedure: COLONOSCOPY;  Surgeon: Danie Binder, MD;  Location: [facility] ENDO SUITE;  Service: Endoscopy;  Laterality: N/A;  2:00   TEE WITHOUT CARDIOVERSION N/A 07/11/2019   Procedure: TRANSESOPHAGEAL ECHOCARDIOGRAM (TEE) WITH PROPOFOL;  Surgeon: Satira Sark, MD;  Location: [facility] ORS;  Service: Cardiovascular;  Laterality: N/A;   TUBAL LIGATION      Social History:  reports that she has never smoked. She has never used smokeless tobacco. She reports current alcohol use. She reports that she does not use drugs.   Allergies  Allergen Reactions   Strawberry C [Ascorbic Acid] Hives    Family History  Problem Relation Age of Onset   Diabetes Mother    Cirrhosis Father    Diabetes Brother    Kidney disease Sister    Colon cancer Neg Hx    Colon polyps Neg Hx     Family history reviewed and not pertinent    Prior to Admission medications   Medication Sig Start Date End Date Taking? Authorizing Provider  albuterol (PROVENTIL) (2.5 MG/3ML) 0.083% nebulizer solution Inhale 3 mL by nebulization route. 03/25/22  Yes [provider]   doxycycline (VIBRAMYCIN) 100 MG capsule Take 1 capsule twice a day by oral route for 7 days. 03/25/22  Yes [provider]  methylPREDNISolone acetate (DEPO-MEDROL) 80 MG/ML injection Take 80 mg by injection route. 03/25/22  Yes [provider]  albuterol (VENTOLIN HFA) 108 (90 Base) MCG/ACT inhaler Inhale 1-2 puffs into the lungs every 6 (six) hours as needed for wheezing or shortness of breath. 07/11/19   Johnson, Clanford L, MD  apixaban (ELIQUIS) 5 MG TABS tablet Take 1 tablet (5 mg total) by mouth 2 (two) times daily. PT NEEDS APPT WITH DR MCDOWELL FOR FUTURE REFILLS. 03/16/22   Satira Sark, MD  lisinopril (ZESTRIL) 40 MG tablet TAKE ONE TABLET BY MOUTH ONCE DAILY. 04/20/21   Imogene Burn, PA-C     Objective    Physical Exam: Vitals:   03/26/22 1830 03/26/22 1900 03/26/22 1930 03/26/22 2000  BP: (!) 144/54 (!) 169/57 (!) 141/81 (!) 154/64  Pulse: 82 90 79 73  Resp: (!) 28 (!) 33 (!) 27 (!) 25  Temp:      TempSrc:      SpO2: 96% 92% 90% 98%  Weight:      Height:        General: appears to be stated age; alert, oriented; mildly increased work of breathing noted. Skin: warm, dry, no rash Head:  AT/[place] Mouth:  Oral mucosa membranes appear moist, normal dentition Neck: supple; trachea midline Heart:  RRR; did not appreciate any M/R/G Lungs: Expiratory wheezes bilaterally ; otherwise, CTAB, did not appreciate any rales or rhonchi Abdomen: + BS; soft, ND, NT Vascular: 2+ pedal pulses b/l; 2+ radial pulses b/l Extremities: no peripheral edema, no muscle wasting Neuro: strength and sensation intact in upper and lower extremities b/l     Labs on Admission: I have personally reviewed following labs and imaging studies  CBC: Recent Labs  Lab 03/26/22 1550  WBC 7.0  NEUTROABS 5.0  HGB 9.0*  HCT 30.3*  MCV 81.2  PLT 99991111   Basic Metabolic Panel: Recent Labs  Lab 03/26/22 1550  NA 138  K 3.4*  CL 104  CO2 25  GLUCOSE 108*  BUN 9  CREATININE  0.86  CALCIUM 8.4*   GFR: Estimated Creatinine Clearance: 76.7 mL/min (by C-G formula based on SCr of 0.86 mg/dL). Liver Function Tests: No results for input(s): "AST", "ALT", "ALKPHOS", "BILITOT", "PROT", "ALBUMIN" in the last 168 hours. No results for input(s): "LIPASE", "AMYLASE" in the last 168 hours. No results for input(s): "AMMONIA" in the last 168 hours. Coagulation Profile: No results for input(s): "INR", "PROTIME" in the last 168 hours. Cardiac Enzymes: No results for input(s): "CKTOTAL", "CKMB", "CKMBINDEX", "TROPONINI" in the last 168 hours. BNP (last 3 results) No results for input(s): "PROBNP" in the last 8760 hours. HbA1C: No results for input(s): "HGBA1C" in the last 72 hours. CBG: No results for input(s): "GLUCAP" in the last 168 hours. Lipid Profile: No results for input(s): "CHOL", "HDL", "LDLCALC", "TRIG", "CHOLHDL", "LDLDIRECT" in the last 72 hours. Thyroid Function Tests: No results for input(s): "TSH", "T4TOTAL", "FREET4", "T3FREE", "THYROIDAB" in the last 72 hours. Anemia Panel: No results for input(s): "VITAMINB12", "FOLATE", "FERRITIN", "TIBC", "IRON", "RETICCTPCT" in the last 72 hours. Urine analysis: No results found for: "COLORURINE", "APPEARANCEUR", "LABSPEC", "PHURINE", "GLUCOSEU", "HGBUR", "BILIRUBINUR", "KETONESUR", "PROTEINUR", "UROBILINOGEN", "NITRITE", "LEUKOCYTESUR"  Radiological Exams on Admission: DG Chest 2 View  Result Date: 03/26/2022 CLINICAL DATA:  Wheezing.  Asthma. EXAM: CHEST - 2 VIEW COMPARISON:  07/28/2019 FINDINGS: The heart size and mediastinal contours are within normal limits. Both lungs are clear. The visualized skeletal structures are unremarkable. IMPRESSION: No active cardiopulmonary disease. Electronically Signed   By: Marlaine Hind M.D.   On: 03/26/2022 15:40      Assessment/Plan   Principal Problem:   Acute asthma exacerbation Active Problems:   Paroxysmal atrial fibrillation (HCC)   Essential hypertension   RSV  infection   Hypokalemia   Acute on chronic anemia   Prolonged QT interval      #) Acute asthma exacerbation: In the context of documented history of moderate persistent asthma, the patient presents with 2-3 days of progressive shortness of breath associated with new onset expiratory wheezing, increased work of breathing, cough, with chest x-ray showing no evidence of acute cardiopulmonary process.  This appears to be a basis of RSV infection, given positive associated PCR found per ED workup today, while COVID and influenza PCR were negative.  No radiographic evidence of underlying bacterial pneumonia, will add on procalcitonin level to further assess given the neutrophilic predominance associated with her CBC results.  No clinical or radiographic evidence to suggest acutely decompensated heart failure at this time.  No evidence of acute hypoxic respiratory distress at this time, but will repeat blood gas in the morning to evaluate for any evidence of interval development of respiratory fatigue.    Plan: Scheduled duo nebulizer  treatments, prn albuterol, Solu-Medrol.  Check serum magnesium and phosphorus levels.  Repeat VBG in the morning to evaluate for any interval evidence to suggest respiratory fatigue.   Add on procalcitonin level.  Repeat CMP and CBC in the morning.  Scheduled guaifenesin, prn Tessalon Perles.  Supportive measures for corresponding RSV infection, including flutter valve and incentive spirometry.            #) RSV infection: Diagnosis on the basis of 2 to 3 days of progressive shortness of breath associated with new onset cough, subjective fever, generalized myalgias, with RSV PCR found to be positive today.  Chest x-ray without any evidence of acute cardiopulmonary process, including no evidence of infiltrate or edema.  Appears to be a contributing factor leading to her presenting acute asthma exacerbation, as above.  In patient aforementioned evaluation and management  of acute asthma exacerbation, will proceed with supportive measures as it relates to her underlying RSV infection, as detailed below.  Plan: Further evaluation management of acute asthma exacerbation, as above, including cystoscopy corticosteroids, scheduled steroids, prn albuterol.  Flutter valve, incentive parameter.  Prn Tessalon Perles.  Repeat CBC in the morning.           #) Hypokalemia: Presented to potassium level found to be mildly low at 3.4.  She received 40 meq of oral potassium chloride supplementation in the ED this evening.  Of note, in the setting of presenting acute asthma exacerbation, she is also received empiric administration of 2 g of IV magnesium sulfate.   Plan: Add on serum magnesium level.  Repeat CMP in the morning.           #) Acute on chronic anemia: In the context of a document history of chronic anemia with baseline hemoglobin ranging appearing to be 11-12, present hemoglobin found to be slightly lower than his baseline, noting a value of 9.0 per ED workup today.  However, the specific acuity versus chronicity of this finding is unclear, as there appears to be a nearly 3-year gap in data points when evaluating these values.  No overt source of active bleed at this time, will noting that the patient is chronically anticoagulated on Eliquis.  Source not entirely clear at this time, but will pursue further laboratory evaluation such, as outlined below, including evaluating for any optimize ability from an iron study standpoint given her presenting shortness of breath.   Plan: Add on iron studies, reticulocyte count, [id number], folic acid level.  Check INR, PTT, will neurologic the patient is chronically anticoagulated on Eliquis.-Screen ordered for repeat CBC in the morning.            #) QTc prolongation: Presenting EKG demonstrates QTc of 578 ms. no overt outpatient medications that may be contributing to QTc prolongation.  Of note, the patient  received 2 g of IV magnesium sulfate in the ED today as competitive management of presenting acute asthma exacerbation.    Plan: Monitor on telemetry.  Add-on serum magnesium level.  Repeat EKG in the morning to monitor interval degree of QTc prolongation.              #) Essential Hypertension: documented h/o such, with outpatient antihypertensive regimen including lisinopril.  SBP's in the ED today: 140s to 160s mmHg.   Plan: Close monitoring of subsequent BP via routine VS. resume home lisinopril.                #) Paroxysmal atrial fibrillation: Documented history of such. In setting of  CHA2DS2-VASc score of 3, there is an indication for chronic anticoagulation for thromboembolic prophylaxis. Consistent with this, patient is chronically anticoagulated on Eliquis.  Of note, does not appear to be on anyAV nodal blocking medications as an outpatient..  She is status post electrocardioversion performed in May 2021. most recent echocardiogram was performed in May 2021, with complete echocardiogram at that time notable for LVEF 70-75% with trivial mitral regurgitation, and additional findings as outlined above. Presenting EKG demonstrates sinus rhythm without overt evidence of acute ischemic changes.    Plan: monitor strict I's & O's and daily weights. CMP/CBC in AM. Check serum mag level. Continue home Eliquis.  Monitor on telemetry.       DVT prophylaxis: SCD's plus home Eliquis Code Status: Full code Family Communication: none Disposition Plan: Per Rounding Team Consults called: none;  Admission status: Inpatient     I SPENT GREATER THAN 75  MINUTES IN CLINICAL CARE TIME/MEDICAL DECISION-MAKING IN COMPLETING THIS ADMISSION.      [place] DO [facility]  From [place]   03/26/2022, 8:16 PM

## 2022-03-26 NOTE — ED Notes (Signed)
Patient transported to X-ray 

## 2022-03-26 NOTE — ED Provider Notes (Signed)
[name]   CSN: [id number] Arrival date & time: 03/26/22  1512     History  Chief Complaint  Patient presents with   Shortness of Breath   Asthma    Carolyn Silva is a 72 y.o. female with history significant for asthma, paroxysmal atrial fibrillation, hypertension, OSA presents to the ED complaining of shortness of breath that began yesterday but is progressively worsening.  She was seen by her PCP yesterday for same symptoms and diagnosed with a URI and given and antibiotic shot, but is unable to recall what exactly she got.  She reports last night she was struggling to breathe despite using her CPAP.  She also has associated cough.  Denies fever, chest pain, palpitations, congestion, rhinorrhea, sore throat, dizziness, light-headedness.  She states she has been using her rescue inhaler 3 times per day without relief.  She is chronically anticoagulated with Eliquis.         Home Medications Prior to Admission medications   Medication Sig Start Date End Date Taking? Authorizing Provider  albuterol (PROVENTIL) (2.5 MG/3ML) 0.083% nebulizer solution Inhale 3 mL by nebulization route. 03/25/22  Yes [provider]  doxycycline (VIBRAMYCIN) 100 MG capsule Take 1 capsule twice a day by oral route for 7 days. 03/25/22  Yes [provider]  methylPREDNISolone acetate (DEPO-MEDROL) 80 MG/ML injection Take 80 mg by injection route. 03/25/22  Yes [provider]  albuterol (VENTOLIN HFA) 108 (90 Base) MCG/ACT inhaler Inhale 1-2 puffs into the lungs every 6 (six) hours as needed for wheezing or shortness of breath. 07/11/19   Johnson, Clanford L, MD  apixaban (ELIQUIS) 5 MG TABS tablet Take 1 tablet (5 mg total) by mouth 2 (two) times daily. PT NEEDS APPT WITH DR MCDOWELL FOR FUTURE REFILLS. 03/16/22   Satira Sark, MD  lisinopril (ZESTRIL) 40 MG tablet TAKE ONE TABLET BY MOUTH ONCE DAILY. 04/20/21   Imogene Burn, PA-C      Allergies    Strawberry c [ascorbic acid]    Review of Systems   Review of Systems  Constitutional:  Negative for fever.  HENT:  Negative for congestion, rhinorrhea and sore throat.   Respiratory:  Positive for cough, chest tightness, shortness of breath and wheezing.   Cardiovascular:  Negative for chest pain and palpitations.  Neurological:  Negative for dizziness and light-headedness.    Physical Exam Updated Vital Signs BP (!) 155/59 (BP Location: Left Arm)   Pulse 66   Temp 98.3 F (36.8 C) (Oral)   Resp (!) 22   Ht 5' 5"$  (1.651 m)   Wt 113.9 kg   SpO2 100%   BMI 41.77 kg/m  Physical Exam Vitals and nursing note reviewed.  Constitutional:      General: She is not in acute distress.    Appearance: She is not ill-appearing.  HENT:     Nose: Nose normal. No congestion or rhinorrhea.     Mouth/Throat:     Mouth: Mucous membranes are moist.     Pharynx: Oropharynx is clear.  Eyes:     Pupils: Pupils are equal, round, and reactive to light.  Cardiovascular:     Rate and Rhythm: Normal rate and regular rhythm.     Pulses: Normal pulses.     Heart sounds: Normal heart sounds.  Pulmonary:     Effort: Tachypnea, accessory muscle usage and respiratory distress present.     Breath sounds: Normal air entry.  Decreased breath sounds and wheezing present.     Comments: Inspiratory and expiratory wheezing with tachypnea, accessory muscle usage, and increased work of breathing.  Patient is able to speak in 1-2 word sentences.  Non-productive cough appreciated during exam.   Abdominal:     General: Abdomen is flat. Bowel sounds are normal. There is no distension.     Palpations: Abdomen is soft.     Tenderness: There is no abdominal tenderness.  Musculoskeletal:     Right lower leg: No edema.     Left lower leg: No edema.  Skin:    General: Skin is warm and dry.     Capillary Refill: Capillary refill takes less than 2 seconds.     Coloration: Skin is  not cyanotic or pale.  Neurological:     Mental Status: She is alert. Mental status is at baseline.  Psychiatric:        Mood and Affect: Mood normal.        Behavior: Behavior normal.     ED Results / Procedures / Treatments   Labs (all labs ordered are listed, but only abnormal results are displayed) Labs Reviewed  RESP PANEL BY RT-PCR (RSV, FLU A&B, COVID)  RVPGX2 - Abnormal; Notable for the following components:      Result Value   Resp Syncytial Virus by PCR POSITIVE (*)    All other components within normal limits  CBC WITH DIFFERENTIAL/PLATELET - Abnormal; Notable for the following components:   RBC 3.73 (*)    Hemoglobin 9.0 (*)    HCT 30.3 (*)    MCH 24.1 (*)    MCHC 29.7 (*)    RDW 16.1 (*)    All other components within normal limits  BASIC METABOLIC PANEL - Abnormal; Notable for the following components:   Potassium 3.4 (*)    Glucose, Bld 108 (*)    Calcium 8.4 (*)    All other components within normal limits  BLOOD GAS, ARTERIAL - Abnormal; Notable for the following components:   pO2, Arterial 68 (*)    All other components within normal limits    EKG EKG Interpretation  Date/Time:  Saturday March 26 2022 15:27:41 EST Ventricular Rate:  68 PR Interval:  64 QRS Duration: 86 QT Interval:  543 QTC Calculation: 578 R Axis:   37 Text Interpretation: Sinus rhythm Short PR interval Prolonged QT interval Baseline wander in lead(s) I II aVR V1 Confirmed by Noemi Chapel [phone]) on 03/26/2022 4:12:29 PM  Radiology DG Chest 2 View  Result Date: 03/26/2022 CLINICAL DATA:  Wheezing.  Asthma. EXAM: CHEST - 2 VIEW COMPARISON:  07/28/2019 FINDINGS: The heart size and mediastinal contours are within normal limits. Both lungs are clear. The visualized skeletal structures are unremarkable. IMPRESSION: No active cardiopulmonary disease. Electronically Signed   By: Marlaine Hind M.D.   On: 03/26/2022 15:40    Procedures .Critical Care  Performed by: Pat Kocher,  PA Authorized by: Pat Kocher, PA   Critical care provider statement:    Critical care time (minutes):  35   Critical care was necessary to treat or prevent imminent or life-threatening deterioration of the following conditions:  Respiratory failure   Critical care was time spent personally by me on the following activities:  Development of treatment plan with patient or surrogate, discussions with consultants, evaluation of patient's response to treatment, examination of patient, ordering and review of laboratory studies, ordering and review of radiographic studies, ordering and performing treatments and interventions,  pulse oximetry, re-evaluation of patient's condition, review of old charts and obtaining history from patient or surrogate   Care discussed with: admitting provider       Medications Ordered in ED Medications  albuterol (VENTOLIN HFA) 108 (90 Base) MCG/ACT inhaler 2 puff (2 puffs Inhalation Given 03/26/22 1529)  ipratropium-albuterol (DUONEB) 0.5-2.5 (3) MG/3ML nebulizer solution 3 mL (3 mLs Nebulization Given 03/26/22 1545)  albuterol (PROVENTIL) (2.5 MG/3ML) 0.083% nebulizer solution 2.5 mg (2.5 mg Nebulization Given 03/26/22 1545)  methylPREDNISolone sodium succinate (SOLU-MEDROL) 125 mg/2 mL injection 125 mg (125 mg Intravenous Given 03/26/22 1700)  magnesium sulfate IVPB 2 g 50 mL (2 g Intravenous New Bag/Given 03/26/22 1716)  albuterol (PROVENTIL,VENTOLIN) solution continuous neb (10 mg/hr Nebulization Given 03/26/22 1717)    ED Course/ Medical Decision Making/ A&P                             Medical Decision Making Amount and/or Complexity of Data Reviewed Labs: ordered. Radiology: ordered.  Risk Prescription drug management.   This patient presents to the ED with chief complaint(s) of acute asthma exacerbation with cough with pertinent past medical history of asthma, paroxysmal Afib, hypertension, OSA.  The complaint involves an extensive differential diagnosis  and also carries with it a high risk of complications and morbidity.    The differential diagnosis includes acute asthma exacerbation, influenza, COVID, RSV; due to patient being on chronic anticoagulation, low suspicion for PE; low suspicion for ACS due to no chest pain and acute asthma exacerbation presentation   The initial plan is to obtain chest x-ray, baseline labs and blood gas Additional history obtained: Additional history obtained from  none Records reviewed Primary Care Documents  Initial Assessment:   On exam, patient is in respiratory distress, is tachypneic, with audible inspiratory and expiratory wheezing.  Lung auscultation reveals severe inspiratory and expiratory wheezing in all lung fields with diminished breath sounds.  Heart rate is normal in the 60s with regular rhythm.  Patient is only able to speak in 1-2 word sentences.  She also has a non-productive cough during exam.  Skin is warm and dry, no diaphoresis or cyanosis.  Abdomen is soft and non tender on palpation.  No bilateral lower extremity edema.    Independent ECG/labs interpretation:  The following labs were independently interpreted:  CBC with microcytic anemia, no evidence of leukocytosis.  Metabolic panel with hypocalcemia and hypokalemia.  No other major electrolyte disturbances.  Respiratory panel positive for RSV, negative for COVID and influenza.  Blood gas with normal pH, mildly decreased pO2, but otherwise all within normal limits.    Independent visualization and interpretation of imaging: I independently visualized the following imaging with scope of interpretation limited to determining acute life threatening conditions related to emergency care: chest x-ray, which revealed no infiltrate to suggest pulmonary edema or pneumonia, no pleural effusion, or pneumothorax.  Heart is of normal size.  I agree with radiologist interpretation.    Treatment and Reassessment: Patient was immediately started on  nebulized breathing treatments and transitioned to continuous neb.  Bipap was considered if patient did not improve with nebulized treatments.  She was also given magnesium and Solu Medrol IV.  Patient will be given PO potassium due to hypokalemia.    Upon reassessment following nebulizer treatments, patient appears to be much more comfortable and no longer appears to be in respiratory distress.  She is able to speak in full sentences.  She still  has significant expiratory wheezing on auscultation, however, she has had some improvement.  Patient reports feeling better.  Patient does still have significant wheezing despite multiple treatments, therefore, I believe she would benefit from hospitalization for continued treatment.  Exacerbation likely secondary to RSV.    Consultations obtained:   I requested consultation with on-call hospitalist provider and spoke with Babs Bertin who agreed with hospital admission.    Disposition:   Patient to be admitted to the hospital for management of asthma exacerbation secondary to RSV.           Final Clinical Impression(s) / ED Diagnoses Final diagnoses:  Moderate persistent asthma with acute exacerbation  RSV (respiratory syncytial virus infection)    Rx / DC Orders ED Discharge Orders     None         Pat Kocher, [place] 03/26/22 Lona Kettle    Noemi Chapel, MD 03/27/22 [phone]

## 2022-03-26 NOTE — ED Triage Notes (Addendum)
Pt to ED c/o SHOB x 1 day, progressively getting worse, reports went to PCP yesterday for the same, and was dx with URI and given abx shot. Audible wheezing noted. Pt reports hx asthma. Denies chest pain

## 2022-03-27 DIAGNOSIS — J9601 Acute respiratory failure with hypoxia: Secondary | ICD-10-CM

## 2022-03-27 LAB — CBC WITH DIFFERENTIAL/PLATELET
Abs Immature Granulocytes: 0.03 10*3/uL (ref 0.00–0.07)
Basophils Absolute: 0 10*3/uL (ref 0.0–0.1)
Basophils Relative: 0 %
Eosinophils Absolute: 0 10*3/uL (ref 0.0–0.5)
Eosinophils Relative: 0 %
HCT: 31.4 % — ABNORMAL LOW (ref 36.0–46.0)
Hemoglobin: 9.3 g/dL — ABNORMAL LOW (ref 12.0–15.0)
Immature Granulocytes: 1 %
Lymphocytes Relative: 10 %
Lymphs Abs: 0.6 10*3/uL — ABNORMAL LOW (ref 0.7–4.0)
MCH: 24.5 pg — ABNORMAL LOW (ref 26.0–34.0)
MCHC: 29.6 g/dL — ABNORMAL LOW (ref 30.0–36.0)
MCV: 82.8 fL (ref 80.0–100.0)
Monocytes Absolute: 0.3 10*3/uL (ref 0.1–1.0)
Monocytes Relative: 4 %
Neutro Abs: 5.7 10*3/uL (ref 1.7–7.7)
Neutrophils Relative %: 85 %
Platelets: 263 10*3/uL (ref 150–400)
RBC: 3.79 MIL/uL — ABNORMAL LOW (ref 3.87–5.11)
RDW: 16.1 % — ABNORMAL HIGH (ref 11.5–15.5)
WBC: 6.6 10*3/uL (ref 4.0–10.5)
nRBC: 0 % (ref 0.0–0.2)

## 2022-03-27 LAB — BLOOD GAS, VENOUS
Acid-Base Excess: 1.8 mmol/L (ref 0.0–2.0)
Bicarbonate: 28.6 mmol/L — ABNORMAL HIGH (ref 20.0–28.0)
Drawn by: 26160
O2 Saturation: 62.1 %
Patient temperature: 36.9
pCO2, Ven: 53 mmHg (ref 44–60)
pH, Ven: 7.34 (ref 7.25–7.43)
pO2, Ven: 42 mmHg (ref 32–45)

## 2022-03-27 LAB — COMPREHENSIVE METABOLIC PANEL
ALT: 17 U/L (ref 0–44)
AST: 23 U/L (ref 15–41)
Albumin: 3.7 g/dL (ref 3.5–5.0)
Alkaline Phosphatase: 85 U/L (ref 38–126)
Anion gap: 9 (ref 5–15)
BUN: 8 mg/dL (ref 8–23)
CO2: 24 mmol/L (ref 22–32)
Calcium: 8.8 mg/dL — ABNORMAL LOW (ref 8.9–10.3)
Chloride: 106 mmol/L (ref 98–111)
Creatinine, Ser: 0.68 mg/dL (ref 0.44–1.00)
GFR, Estimated: >60 mL/min (ref >60)
Glucose, Bld: 141 mg/dL — ABNORMAL HIGH (ref 70–99)
Potassium: 3.8 mmol/L (ref 3.5–5.1)
Sodium: 139 mmol/L (ref 135–145)
Total Bilirubin: 0.4 mg/dL (ref 0.3–1.2)
Total Protein: 7.6 g/dL (ref 6.5–8.1)

## 2022-03-27 LAB — PROTIME-INR
INR: 1.2 (ref 0.8–1.2)
Prothrombin Time: 14.7 seconds (ref 11.4–15.2)

## 2022-03-27 LAB — PHOSPHORUS: Phosphorus: 3 mg/dL (ref 2.5–4.6)

## 2022-03-27 LAB — MAGNESIUM
Magnesium: 2.3 mg/dL (ref 1.7–2.4)
Magnesium: 2.5 mg/dL — ABNORMAL HIGH (ref 1.7–2.4)

## 2022-03-27 MED ORDER — BENZONATATE 100 MG PO CAPS
200.0000 mg | ORAL_CAPSULE | Freq: Three times a day (TID) | ORAL | Status: DC
  Administered 2022-03-27 – 2022-03-28 (×3): 200 mg via ORAL
  Filled 2022-03-27 (×4): qty 2

## 2022-03-27 MED ORDER — DEXTROMETHORPHAN POLISTIREX ER 30 MG/5ML PO SUER
30.0000 mg | Freq: Two times a day (BID) | ORAL | Status: DC
  Administered 2022-03-27 – 2022-03-28 (×3): 30 mg via ORAL
  Filled 2022-03-27 (×4): qty 5

## 2022-03-27 MED ORDER — GUAIFENESIN ER 600 MG PO TB12
1200.0000 mg | ORAL_TABLET | Freq: Two times a day (BID) | ORAL | Status: DC
  Administered 2022-03-27 – 2022-03-29 (×5): 1200 mg via ORAL
  Filled 2022-03-27 (×5): qty 2

## 2022-03-27 MED ORDER — HYDROCOD POLI-CHLORPHE POLI ER 10-8 MG/5ML PO SUER: 5.0000 mL | Freq: Every evening | ORAL | Status: DC | PRN

## 2022-03-27 NOTE — TOC Progression Note (Signed)
Transition of Care [facility]) - Progression Note    Patient Details  Name: Carolyn Silva MRN: [id number] Date of Birth: [birth year]  Transition of [facility]) CM/SW Contact  Salome Arnt, [place] Phone Number: 03/27/2022, 10:43 AM  Clinical Narrative:  Transition of Care [facility]) Screening Note   Patient Details  Name: Carolyn Silva Date of Birth: April 03, 1950   Transition of Care [facility]) CM/SW Contact:    Salome Arnt, LCSW Phone Number: 03/27/2022, 10:43 AM    Transition of Care Department [facility]) has reviewed patient and no TOC needs have been identified at this time. We will continue to monitor patient advancement through interdisciplinary progression rounds. If new patient transition needs arise, please place a TOC consult.             Expected Discharge Plan and Services                                               Social Determinants of Health (SDOH) Interventions SDOH Screenings   Food Insecurity: No Food Insecurity (07/26/2019)  Transportation Needs: No Transportation Needs (07/26/2019)  Depression (PHQ2-9): Low Risk  (07/26/2019)  Tobacco Use: Low Risk  (03/26/2022)    Readmission Risk Interventions     No data to display

## 2022-03-27 NOTE — Hospital Course (Signed)
PMH of asthma, PAF, HTN, anemia, presented to hospital with complaints of shortness of breath and cough. Found to have RSV infection with acute asthma exacerbation.

## 2022-03-27 NOTE — Progress Notes (Signed)
[facility] Progress Note Patient: Carolyn Silva [id number] DOB: April 25, 1950 DOA: 03/26/2022  DOS: the patient was seen and examined on 03/27/2022  Brief hospital course: PMH of asthma, PAF, HTN, anemia, presented to hospital with complaints of shortness of breath and cough. Found to have RSV infection with acute asthma exacerbation. Assessment and Plan: Acute asthma exacerbation secondary to RSV bronchiolitis. Chest x-ray clear for any acute pneumonia. Procalcitonin negative. Patient was actually on oral antibiotic but I do not think that she requires antibiotic for now. Continue steroids.  Continue nebulizer therapy.  Continue aggressive cough suppression.  Monitor.  Hypokalemia. Corrected.  Anemia. Do not think that the patient actually has any acute anemia as no acute bleeding.  Reticulocyte counts are relatively stable. Hemoglobin is relatively stable. She probably has chronic anemia progressively worsening. Labs available in our system 72-year-old other than this admission. Monitor H&H. Outpatient follow-up with PCP.  OSA. Class III obesity. Body mass index is 40.98 kg/m.  Placing the patient on hide is a poor outcome. Continue supportive care. CPAP nightly.  HTN. Blood pressure rather elevated. Continue current regimen for now.  Prolonged QT. Resolved.   Subjective: Continues to have cough continues to have shortness of breath.  No nausea no vomiting or no chest pain.  Physical Exam: General: in moderate distress, No Rash Cardiovascular: S1 and S2 Present, No Murmur Respiratory: Increased respiratory effort, Bilateral Air entry present. No Crackles, bilateral wheezes Abdomen: Bowel Sound present, No tenderness Extremities: No edema Neuro: Alert and oriented x3, no new focal deficit  Data Reviewed: I have Reviewed nursing notes, Vitals, and Lab results. Since last encounter, pertinent lab results CBC and BMP   . I have ordered test including CBC and  BMP  .   Disposition: Status is: Inpatient Remains inpatient appropriate because: Continue IV steroids  SCDs Start: 03/26/22 1949 apixaban (ELIQUIS) tablet 5 mg   Family Communication: Family at bedside Level of care: Med-Surg Switch from telemetry to [place]. Vitals:   03/26/22 2354 03/27/22 0418 03/27/22 1047 03/27/22 1434  BP:  (!) 145/67    Pulse:  60    Resp:  18    Temp:  98.4 F (36.9 C)    TempSrc:  Oral    SpO2: 96% 100% 96% 94%  Weight:  111.7 kg    Height:         Author: Berle Mull, MD 03/27/2022 5:44 PM  Please look on [email or web address] to find out who is on call.

## 2022-03-27 NOTE — Plan of Care (Signed)
  Problem: Education: Goal: Knowledge of General Education information will improve Description Including pain rating scale, medication(s)/side effects and non-pharmacologic comfort measures Outcome: Progressing   

## 2022-03-28 DIAGNOSIS — J4541 Moderate persistent asthma with (acute) exacerbation: Secondary | ICD-10-CM | POA: Diagnosis not present

## 2022-03-28 MED ORDER — METHYLPREDNISOLONE SODIUM SUCC 125 MG IJ SOLR
80.0000 mg | Freq: Every day | INTRAMUSCULAR | Status: DC
  Administered 2022-03-28 – 2022-03-29 (×2): 80 mg via INTRAVENOUS
  Filled 2022-03-28: qty 2
  Filled 2022-03-28: qty 1.28

## 2022-03-28 NOTE — Progress Notes (Signed)
Mobility Specialist Progress Note:    03/28/22 1344  Mobility  Activity Ambulated independently in hallway  Level of Assistance Standby assist, set-up cues, supervision of patient - no hands on  Assistive Device None  Distance Ambulated (ft) 250 ft  Activity Response Tolerated well  Mobility Referral Yes  $Mobility charge 1 Mobility   Pt was agreeable to mobility session. Tolerated well, audible SOB, no c/o dizziness. SpO2 lowered to 89% during ambulation, took 1 standing rest break to recover, SpO2 92%. Returned pt to EOB, SpO2 recovered to 96%. Left pt with all needs met.   Royetta Crochet Mobility Specialist Please contact via S[facility] or  Rehab office at [phone]

## 2022-03-28 NOTE — Progress Notes (Signed)
[facility] Progress Note Patient: Carolyn Silva [id number] DOB: February 25, 1950 DOA: 03/26/2022  DOS: the patient was seen and examined on 03/28/2022  Brief hospital course: PMH of asthma, PAF, HTN, anemia, presented to hospital with complaints of shortness of breath and cough. Found to have RSV infection with acute asthma exacerbation. Assessment and Plan: Acute asthma exacerbation secondary to RSV bronchiolitis. Chest x-ray clear for any acute pneumonia. Procalcitonin negative. Patient was actually on oral antibiotic but I do not think that she requires antibiotic for now. Continue steroids.  Continue nebulizer therapy.  Continue aggressive cough suppression.  Cough better., walked 250 ft, on room air.  Monitor.   Hypokalemia. Corrected.   Anemia. Do not think that the patient actually has any acute anemia as no acute bleeding. Reticulocyte counts are relatively stable. Hemoglobin is relatively stable. She probably has chronic anemia progressively worsening. Labs available in our system 72-year-old other than this admission. Monitor H&H. Outpatient follow-up with PCP.   OSA. Class III obesity. Body mass index is 40.98 kg/m.  Placing the patient on hide is a poor outcome. Continue supportive care. CPAP nightly.   HTN. Blood pressure rather elevated. Continue current regimen for now.   Prolonged QT. Resolved.   Subjective: No nausea no vomiting.  No fever no chills.  Continues to have cough and shortness of breath although improving.  Physical Exam: General: in moderate distress, No Rash Cardiovascular: S1 and S2 Present, No Murmur Respiratory: Increased respiratory effort, Bilateral Air entry present. No Crackles, improving bilateral wheezes Abdomen: Bowel Sound present, No tenderness Extremities: No edema Neuro: Alert and oriented x3, no new focal deficit  Data Reviewed: I have Reviewed nursing notes, Vitals, and Lab results. Since last encounter, pertinent  lab results CBC and BMP   . I have ordered test including CBC and BMP  .   Disposition: Status is: Inpatient Remains inpatient appropriate because: Needing IV steroids  SCDs Start: 03/26/22 1949 apixaban (ELIQUIS) tablet 5 mg   Family Communication: No one at bedside Level of care: Med-Surg Switch to MedSurg.  Vitals:   03/28/22 0623 03/28/22 0712 03/28/22 0824 03/28/22 1222  BP: (!) 144/61  (!) 143/68   Pulse: (!) 55  (!) 58   Resp: (!) 24  16   Temp: 97.8 F (36.6 C)  98 F (36.7 C)   TempSrc: Oral  Oral   SpO2: 99% 98% 100% 100%  Weight:      Height:         Author: Berle Mull, MD 03/28/2022 5:41 PM  Please look on [email or web address] to find out who is on call.

## 2022-03-28 NOTE — Plan of Care (Signed)
  Problem: Education: Goal: Knowledge of General Education information will improve Description Including pain rating scale, medication(s)/side effects and non-pharmacologic comfort measures Outcome: Progressing   

## 2022-03-29 ENCOUNTER — Other Ambulatory Visit: Payer: Self-pay | Admitting: Internal Medicine

## 2022-03-29 DIAGNOSIS — J45901 Unspecified asthma with (acute) exacerbation: Secondary | ICD-10-CM

## 2022-03-29 DIAGNOSIS — J4541 Moderate persistent asthma with (acute) exacerbation: Secondary | ICD-10-CM | POA: Diagnosis not present

## 2022-03-29 LAB — CBC
HCT: 31.4 % — ABNORMAL LOW (ref 36.0–46.0)
Hemoglobin: 9.2 g/dL — ABNORMAL LOW (ref 12.0–15.0)
MCH: 24.3 pg — ABNORMAL LOW (ref 26.0–34.0)
MCHC: 29.3 g/dL — ABNORMAL LOW (ref 30.0–36.0)
MCV: 82.8 fL (ref 80.0–100.0)
Platelets: 270 10*3/uL (ref 150–400)
RBC: 3.79 MIL/uL — ABNORMAL LOW (ref 3.87–5.11)
RDW: 16.2 % — ABNORMAL HIGH (ref 11.5–15.5)
WBC: 9.4 10*3/uL (ref 4.0–10.5)
nRBC: 0 % (ref 0.0–0.2)

## 2022-03-29 LAB — BASIC METABOLIC PANEL
Anion gap: 7 (ref 5–15)
BUN: 18 mg/dL (ref 8–23)
CO2: 27 mmol/L (ref 22–32)
Calcium: 8.7 mg/dL — ABNORMAL LOW (ref 8.9–10.3)
Chloride: 106 mmol/L (ref 98–111)
Creatinine, Ser: 0.74 mg/dL (ref 0.44–1.00)
GFR, Estimated: >60 mL/min (ref >60)
Glucose, Bld: 111 mg/dL — ABNORMAL HIGH (ref 70–99)
Potassium: 3.7 mmol/L (ref 3.5–5.1)
Sodium: 140 mmol/L (ref 135–145)

## 2022-03-29 LAB — MAGNESIUM: Magnesium: 2.4 mg/dL (ref 1.7–2.4)

## 2022-03-29 MED ORDER — LOSARTAN POTASSIUM 50 MG PO TABS: 50.0000 mg | ORAL_TABLET | Freq: Every day | ORAL | 0 refills | Status: DC

## 2022-03-29 MED ORDER — DEXTROMETHORPHAN POLISTIREX ER 30 MG/5ML PO SUER: 30.0000 mg | Freq: Two times a day (BID) | ORAL | 0 refills | Status: DC

## 2022-03-29 MED ORDER — IPRATROPIUM-ALBUTEROL 0.5-2.5 (3) MG/3ML IN SOLN
3.0000 mL | Freq: Three times a day (TID) | RESPIRATORY_TRACT | Status: DC
  Administered 2022-03-29: 3 mL via RESPIRATORY_TRACT
  Filled 2022-03-29: qty 3

## 2022-03-29 MED ORDER — PREDNISONE 10 MG PO TABS: ORAL_TABLET | ORAL | 0 refills | Status: DC

## 2022-03-29 MED ORDER — METHYLPREDNISOLONE SODIUM SUCC 40 MG IJ SOLR
INTRAMUSCULAR | Status: AC
  Filled 2022-03-29: qty 1

## 2022-03-29 MED ORDER — ALBUTEROL SULFATE (2.5 MG/3ML) 0.083% IN NEBU: 2.5000 mg | INHALATION_SOLUTION | RESPIRATORY_TRACT | 1 refills | Status: DC | PRN

## 2022-03-29 MED ORDER — NEBULIZER MISC: 1.0000 | 0 refills | Status: DC

## 2022-03-29 MED ORDER — GUAIFENESIN ER 600 MG PO TB12: 1200.0000 mg | ORAL_TABLET | Freq: Two times a day (BID) | ORAL | 0 refills | Status: DC

## 2022-03-29 MED ORDER — BENZONATATE 200 MG PO CAPS: 200.0000 mg | ORAL_CAPSULE | Freq: Three times a day (TID) | ORAL | 0 refills | Status: DC

## 2022-03-29 MED ORDER — MOMETASONE FURO-FORMOTEROL FUM 100-5 MCG/ACT IN AERO: 2.0000 | INHALATION_SPRAY | Freq: Two times a day (BID) | RESPIRATORY_TRACT | 1 refills | Status: DC

## 2022-03-29 MED ORDER — MENTHOL 3 MG MT LOZG: 1.0000 | LOZENGE | OROMUCOSAL | 12 refills | Status: DC | PRN

## 2022-03-29 NOTE — Plan of Care (Signed)

## 2022-03-29 NOTE — Discharge Summary (Signed)
Physician Discharge Summary   Patient: Carolyn Silva MRN: [id number] DOB: 1950/03/24  Admit date:     03/26/2022  Discharge date: 03/29/2022  Discharge Physician: Berle Mull  PCP: Celene Squibb, MD  Recommendations at discharge: Follow-up with PCP as recommended.   Follow-up Information     Celene Squibb, MD. Schedule an appointment as soon as possible for a visit in 1 week(s).   Specialty: Internal Medicine Contact information: [place] [address] [phone]                Discharge Diagnoses: Principal Problem:   Acute asthma exacerbation Active Problems:   Paroxysmal atrial fibrillation (HCC)   Essential hypertension   RSV (respiratory syncytial virus infection)   Hypokalemia   Acute on chronic anemia   Prolonged QT interval  Hospital Course: PMH of asthma, PAF, HTN, anemia, presented to hospital with complaints of shortness of breath and cough. Found to have RSV infection with acute asthma exacerbation. Assessment and Plan  Acute asthma exacerbation secondary to RSV bronchiolitis. Chest x-ray clear for any acute pneumonia. Procalcitonin negative. Patient was actually on oral antibiotic but I do not think that she requires antibiotic for now. Continue steroids.  Continue nebulizer therapy.  Continue aggressive cough suppression.  Cough better., walked 250 ft, on room air.  Monitor.   Hypokalemia. Corrected.   Anemia. Do not think that the patient actually has any acute anemia as no acute bleeding. Reticulocyte counts are relatively stable. Hemoglobin is relatively stable. She probably has chronic anemia progressively worsening. Labs available in our system 72-year-old other than this admission. Outpatient follow-up with PCP.   OSA. Class III obesity. Body mass index is 40.98 kg/m.  Placing the patient on hide is a poor outcome. Continue supportive care. CPAP nightly.   HTN. Blood pressure rather elevated. Continue  current regimen for now.   Prolonged QT. Resolved.   Consultants:  none  Procedures performed:  none  DISCHARGE MEDICATION: Allergies as of 03/29/2022       Reactions   Strawberry C [ascorbic Acid] Hives        Medication List     STOP taking these medications    doxycycline 100 MG capsule Commonly known as: VIBRAMYCIN   lisinopril 40 MG tablet Commonly known as: ZESTRIL   methylPREDNISolone acetate 80 MG/ML injection Commonly known as: DEPO-MEDROL       TAKE these medications    albuterol 108 (90 Base) MCG/ACT inhaler Commonly known as: VENTOLIN HFA Inhale 1-2 puffs into the lungs every 6 (six) hours as needed for wheezing or shortness of breath. What changed: Another medication with the same name was changed. Make sure you understand how and when to take each.   albuterol (2.5 MG/3ML) 0.083% nebulizer solution Commonly known as: PROVENTIL Take 3 mLs (2.5 mg total) by nebulization every 4 (four) hours as needed for wheezing or shortness of breath. What changed: See the new instructions.   apixaban 5 MG Tabs tablet Commonly known as: Eliquis Take 1 tablet (5 mg total) by mouth 2 (two) times daily. PT NEEDS APPT WITH DR MCDOWELL FOR FUTURE REFILLS.   benzonatate 200 MG capsule Commonly known as: TESSALON Take 1 capsule (200 mg total) by mouth 3 (three) times daily.   dextromethorphan 30 MG/5ML liquid Commonly known as: DELSYM Take 5 mLs (30 mg total) by mouth 2 (two) times daily.   guaiFENesin 600 MG 12 hr tablet Commonly known as: MUCINEX Take 2 tablets (1,200 mg  total) by mouth 2 (two) times daily.   losartan 50 MG tablet Commonly known as: COZAAR Take 1 tablet (50 mg total) by mouth daily.   menthol-cetylpyridinium 3 MG lozenge Commonly known as: CEPACOL Take 1 lozenge (3 mg total) by mouth as needed for sore throat.   mometasone-formoterol 100-5 MCG/ACT Aero Commonly known as: DULERA Inhale 2 puffs into the lungs in the morning and at  bedtime.   predniSONE 10 MG tablet Commonly known as: DELTASONE Take 48m daily for 3days,Take 457mdaily for 3days,Take 3069maily for 3days,Take 61m74mily for 3days,Take 10mg73mly for 3days, then stop       Disposition: Home Diet recommendation: Cardiac diet  Discharge Exam: Vitals:   03/28/22 2117 03/29/22 0333 03/29/22 0500 03/29/22 0747  BP: (!) 148/70 131/75    Pulse: 73 (!) 58  (!) 53  Resp: 18 20  18  $ Temp: 98.3 F (36.8 C) 97.8 F (36.6 C)    TempSrc: Oral Oral    SpO2: 95% 97%  97%  Weight:   112.5 kg   Height:       General: Appear in mild distress; no visible Abnormal Neck Mass Or lumps, Conjunctiva normal Cardiovascular: S1 and S2 Present, no Murmur, Respiratory: good respiratory effort, Bilateral Air entry present and no Crackles, bilateral wheezes Abdomen: Bowel Sound present, Non tender  Extremities: no Pedal edema Neurology: alert and oriented to time, place, and person  Filed Weights   03/27/22 0418 03/28/22 0500 03/29/22 0500  Weight: 111.7 kg 111.9 kg 112.5 kg   Condition at discharge: stable  The results of significant diagnostics from this hospitalization (including imaging, microbiology, ancillary and laboratory) are listed below for reference.   Imaging Studies: DG Chest 2 View  Result Date: 03/26/2022 CLINICAL DATA:  Wheezing.  Asthma. EXAM: CHEST - 2 VIEW COMPARISON:  07/28/2019 FINDINGS: The heart size and mediastinal contours are within normal limits. Both lungs are clear. The visualized skeletal structures are unremarkable. IMPRESSION: No active cardiopulmonary disease. Electronically Signed   By: John Marlaine Hind   On: 03/26/2022 15:40    Microbiology: Results for orders placed or performed during the hospital encounter of 03/26/22  Resp panel by RT-PCR (RSV, Flu A&B, Covid) Anterior Nasal Swab     Status: Abnormal   Collection Time: 03/26/22  3:33 PM   Specimen: Anterior Nasal Swab  Result Value Ref Range Status   SARS Coronavirus 2  by RT PCR NEGATIVE NEGATIVE Final    Comment: (NOTE) SARS-CoV-2 target nucleic acids are NOT DETECTED.  The SARS-CoV-2 RNA is generally detectable in upper respiratory specimens during the acute phase of infection. The lowest concentration of SARS-CoV-2 viral copies this assay can detect is 138 copies/mL. A negative result does not preclude SARS-Cov-2 infection and should not be used as the sole basis for treatment or other patient management decisions. A negative result may occur with  improper specimen collection/handling, submission of specimen other than nasopharyngeal swab, presence of viral mutation(s) within the areas targeted by this assay, and inadequate number of viral copies(<138 copies/mL). A negative result must be combined with clinical observations, patient history, and epidemiological information. The expected result is Negative.  Fact Sheet for Patients:  httpsEntrepreneurPulse.com.aut Sheet for Healthcare Providers:  httpsIncredibleEmployment.bes test is no t yet approved or cleared by the [place]  has been authorized for detection and/or diagnosis of SARS-CoV-2 by FDA under an Emergency Use Authorization (EUA). This EUA will remain  in effect (meaning  this test can be used) for the duration of the COVID-19 declaration under Section 564(b)(1) of the Act, 21 U.S.C.section 360bbb-3(b)(1), unless the authorization is terminated  or revoked sooner.       Influenza A by PCR NEGATIVE NEGATIVE Final   Influenza B by PCR NEGATIVE NEGATIVE Final    Comment: (NOTE) The Xpert Xpress SARS-CoV-2/FLU/RSV plus assay is intended as an aid in the diagnosis of influenza from Nasopharyngeal swab specimens and should not be used as a sole basis for treatment. Nasal washings and aspirates are unacceptable for Xpert Xpress SARS-CoV-2/FLU/RSV testing.  Fact Sheet for Patients: EntrepreneurPulse.com.au  Fact  Sheet for Healthcare Providers: IncredibleEmployment.be  This test is not yet approved or cleared by the [place] FDA and has been authorized for detection and/or diagnosis of SARS-CoV-2 by FDA under an Emergency Use Authorization (EUA). This EUA will remain in effect (meaning this test can be used) for the duration of the COVID-19 declaration under Section 564(b)(1) of the Act, 21 U.S.C. section 360bbb-3(b)(1), unless the authorization is terminated or revoked.     Resp Syncytial Virus by PCR POSITIVE (A) NEGATIVE Final    Comment: (NOTE) Fact Sheet for Patients: EntrepreneurPulse.com.au  Fact Sheet for Healthcare Providers: IncredibleEmployment.be  This test is not yet approved or cleared by the [place] FDA and has been authorized for detection and/or diagnosis of SARS-CoV-2 by FDA under an Emergency Use Authorization (EUA). This EUA will remain in effect (meaning this test can be used) for the duration of the COVID-19 declaration under Section 564(b)(1) of the Act, 21 U.S.C. section 360bbb-3(b)(1), unless the authorization is terminated or revoked.  Performed at [facility], [address] [place], [place] [address]    Labs: CBC: Recent Labs  Lab 03/26/22 1550 03/27/22 0704 03/29/22 0454  WBC 7.0 6.6 9.4  NEUTROABS 5.0 5.7  --   HGB 9.0* 9.3* 9.2*  HCT 30.3* 31.4* 31.4*  MCV 81.2 82.8 82.8  PLT 277 263 [id number]   Basic Metabolic Panel: Recent Labs  Lab 03/26/22 1550 03/27/22 0704 03/29/22 0454  NA 138 139 140  K 3.4* 3.8 3.7  CL 104 106 106  CO2 25 24 27  $ GLUCOSE 108* 141* 111*  BUN 9 8 18  $ CREATININE 0.86 0.68 0.74  CALCIUM 8.4* 8.8* 8.7*  MG 2.3 2.5* 2.4  PHOS  --  3.0  --    Liver Function Tests: Recent Labs  Lab 03/27/22 0704  AST 23  ALT 17  ALKPHOS 85  BILITOT 0.4  PROT 7.6  ALBUMIN 3.7   CBG: No results for input(s): "GLUCAP" in the last 168 hours.  Discharge time spent:  greater than 30 minutes.  Signed: Berle Mull, MD [facility] 03/29/2022

## 2022-03-29 NOTE — Progress Notes (Signed)
Patient has rested well overnight.  No acute events overnight. Patient rested well and has had no new complaints.

## 2022-03-29 NOTE — Progress Notes (Signed)
Mobility Specialist Progress Note:    03/29/22 1157  Mobility  Activity Ambulated independently in hallway  Level of Assistance Standby assist, set-up cues, supervision of patient - no hands on  Assistive Device None  Distance Ambulated (ft) 250 ft  Activity Response Tolerated well  Mobility Referral Yes  $Mobility charge 1 Mobility   Pt was agreeable to mobility session. Tolerated well, had a few coughing spells throughout session, audible SOB. SpO2 90% on room air. Left pt in room with all needs met.   Royetta Crochet Mobility Specialist Please contact via Solicitor or  Rehab office at [phone]

## 2022-03-29 NOTE — Care Management Important Message (Signed)
Important Message  Patient Details  Name: Carolyn Silva MRN: [id number] Date of Birth: 22-Aug-1950   Medicare Important Message Given:  N/A - LOS <3 / Initial given by admissions     Tommy Medal 03/29/2022, 10:48 AM

## 2022-04-11 ENCOUNTER — Other Ambulatory Visit: Payer: Self-pay | Admitting: Physician Assistant

## 2022-04-12 ENCOUNTER — Ambulatory Visit: Payer: 59 | Attending: Medical | Admitting: Medical

## 2022-04-12 ENCOUNTER — Encounter: Payer: Self-pay | Admitting: Medical

## 2022-04-12 VITALS — BP 160/70 | HR 74 | Ht 65.0 in | Wt 256.0 lb

## 2022-04-12 DIAGNOSIS — I10 Essential (primary) hypertension: Secondary | ICD-10-CM | POA: Diagnosis not present

## 2022-04-12 DIAGNOSIS — I4891 Unspecified atrial fibrillation: Secondary | ICD-10-CM

## 2022-04-12 DIAGNOSIS — G4733 Obstructive sleep apnea (adult) (pediatric): Secondary | ICD-10-CM

## 2022-04-12 DIAGNOSIS — I455 Other specified heart block: Secondary | ICD-10-CM | POA: Diagnosis not present

## 2022-04-12 MED ORDER — LOSARTAN POTASSIUM 100 MG PO TABS: 100.0000 mg | ORAL_TABLET | Freq: Every day | ORAL | 3 refills | Status: DC

## 2022-04-12 NOTE — Patient Instructions (Signed)
Medication Instructions:  Your physician has recommended you make the following change in your medication:  - Increase Losartan to 100 mg tablet daily  *If you need a refill on your cardiac medications before your next appointment, please call your pharmacy*   Lab Work: None If you have labs (blood work) drawn today and your tests are completely normal, you will receive your results only by: [place] (if you have MyChart) OR A paper copy in the mail If you have any lab test that is abnormal or we need to change your treatment, we will call you to review the results.   Testing/Procedures: None   Follow-Up: At [facility], you and your health needs are our priority.  As part of our continuing mission to provide you with exceptional heart care, we have created designated Provider Care Teams.  These Care Teams include your primary Cardiologist (physician) and Advanced Practice Providers (APPs -  Physician Assistants and Nurse Practitioners) who all work together to provide you with the care you need, when you need it.  We recommend signing up for the patient portal called "MyChart".  Sign up information is provided on this After Visit Summary.  MyChart is used to connect with patients for Virtual Visits (Telemedicine).  Patients are able to view lab/test results, encounter notes, upcoming appointments, etc.  Non-urgent messages can be sent to your provider as well.   To learn more about what you can do with MyChart, go to [email or web address].    Your next appointment:   2 month(s)  Provider:   You will see one of the following Advanced Practice Providers on your designated Care Team:   [place], PA-C  Ermalinda Barrios, [place]       Other Instructions

## 2022-04-12 NOTE — Progress Notes (Signed)
Cardiology Office Note:    Date:  04/12/2022   ID:  BRONWEN Silva, DOB 02-24-1950, MRN [id number]  PCP:  Celene Squibb, MD  [facility] Cardiologist:  Rozann Lesches, MD  [facility] Electrophysiologist:  None   Referring MD: Celene Squibb, MD   Chief Complaint: 1 year follow-up  History of Present Illness:    Carolyn Silva is a 72 y.o. female with a hx of Afib s/p DCCV on 06/2019, HTN, morbid obesity and asthma who presents for 1 year follow-up.   Patient was admitted to [facility] in May 2021 for A-fib, which was a new diagnosis.  CTA showed no PE.  She did have significant pauses up to 6.29 seconds it was unclear if the patient was sleeping or awake at that time.  Echo showed preserved EF of 70 to 75% with no wall motion abnormalities and no significant valve abnormalities.  Given her intermittent pauses, TEE guided cardioversion was performed in May 2021.  She was discharged on Eliquis but not placed on AV nodal blocking agents.  She was readmitted in May 2021 with a flutter with RVR and hypoxia and went cardioversion in the ER.  Heart monitor showed prolonged pauses up to 7.4 seconds which occurred while the patient was sleeping.  She was sent to the ED.  She denied any symptoms at that time and Dr. Rayann Heman was consulted who recommended outpatient EP visit.  Patient followed up with Dr. Clayborn Bigness, however patient is not interested in [place].  He recommended sleep study.  Was not felt he needed a pacemaker.  Patient was last seen 06/22/2020 was feeling better from a cardiac perspective.   Today, the patient reports she was just in the hospital for RSV and asthma exacerbation. She is back to normal. She denies chest pain, shortness of breath, lower leg edema, orthopnea, or pnd. Needs refills of Eliquis. NO bleeding issues. BP is a little high. She is taking losartan. She is using her CPAP machine.   Past Medical History:  Diagnosis Date   Asthma    Hypertension    OSA  (obstructive sleep apnea)    Paroxysmal atrial fibrillation (HCC)    Paroxysmal atrial flutter [facility])     Past Surgical History:  Procedure Laterality Date   CARDIOVERSION N/A 07/11/2019   Procedure: CARDIOVERSION;  Surgeon: Satira Sark, MD;  Location: [facility] ORS;  Service: Cardiovascular;  Laterality: N/A;   COLONOSCOPY N/A 09/14/2017   Procedure: COLONOSCOPY;  Surgeon: Danie Binder, MD;  Location: [facility] ENDO SUITE;  Service: Endoscopy;  Laterality: N/A;  2:00   TEE WITHOUT CARDIOVERSION N/A 07/11/2019   Procedure: TRANSESOPHAGEAL ECHOCARDIOGRAM (TEE) WITH PROPOFOL;  Surgeon: Satira Sark, MD;  Location: [facility] ORS;  Service: Cardiovascular;  Laterality: N/A;   TUBAL LIGATION      Current Medications: Current Meds  Medication Sig   albuterol (PROVENTIL) (2.5 MG/3ML) 0.083% nebulizer solution Take 3 mLs (2.5 mg total) by nebulization every 4 (four) hours as needed for wheezing or shortness of breath.   albuterol (VENTOLIN HFA) 108 (90 Base) MCG/ACT inhaler Inhale 1-2 puffs into the lungs every 6 (six) hours as needed for wheezing or shortness of breath.   apixaban (ELIQUIS) 5 MG TABS tablet Take 1 tablet (5 mg total) by mouth 2 (two) times daily. PT NEEDS APPT WITH DR MCDOWELL FOR FUTURE REFILLS.   losartan (COZAAR) 100 MG tablet Take 1 tablet (100 mg total) by mouth daily.   menthol-cetylpyridinium (CEPACOL) 3 MG lozenge  Take 1 lozenge (3 mg total) by mouth as needed for sore throat.   mometasone-formoterol (DULERA) 100-5 MCG/ACT AERO Inhale 2 puffs into the lungs in the morning and at bedtime.   Nebulizer MISC 1 each by Does not apply route as directed.   [DISCONTINUED] losartan (COZAAR) 50 MG tablet Take 1 tablet (50 mg total) by mouth daily.     Allergies:   Strawberry c [ascorbic acid]   Social History   Socioeconomic History   Marital status: Single    Spouse name: Not on file   Number of children: Not on file   Years of education: Not on file   Highest education level:  Not on file  Occupational History   Not on file  Tobacco Use   Smoking status: Never   Smokeless tobacco: Never  Vaping Use   Vaping Use: Never used  Substance and Sexual Activity   Alcohol use: Yes    Comment: Occasional   Drug use: No   Sexual activity: Not on file  Other Topics Concern   Not on file  Social History Narrative   Not on file   Social Determinants of Health   Financial Resource Strain: Not on file  Food Insecurity: No Food Insecurity (07/26/2019)   Hunger Vital Sign    Worried About Running Out of Food in the Last Year: Never true    Ran Out of Food in the Last Year: Never true  Transportation Needs: No Transportation Needs (07/26/2019)   PRAPARE - Hydrologist (Medical): No    Lack of Transportation (Non-Medical): No  Physical Activity: Not on file  Stress: Not on file  Social Connections: Not on file     Family History: The patient's family history includes Cirrhosis in her father; Diabetes in her brother and mother; Kidney disease in her sister. There is no history of Colon cancer or Colon polyps.  ROS:   Please see the history of present illness.     All other systems reviewed and are negative.  EKGs/Labs/Other Studies Reviewed:    The following studies were reviewed today:  Echo 06/2019 1. Left ventricular ejection fraction, by estimation, is 70 to 75%. The  left ventricle has hyperdynamic function. The left ventricle has no  regional wall motion abnormalities. There is moderate left ventricular  hypertrophy. Left ventricular diastolic  parameters are indeterminate.   2. Right ventricular systolic function is normal. The right ventricular  size is normal. Tricuspid regurgitation signal is inadequate for assessing  PA pressure.   3. Right atrial size was upper normal.   4. The mitral valve is grossly normal. Trivial mitral valve  regurgitation.   5. The aortic valve is tricuspid. Aortic valve regurgitation is not   visualized.   6. The inferior vena cava is dilated in size with >50% respiratory  variability, suggesting right atrial pressure of 8 mmHg.   EKG:  EKG is ordered today.  The ekg ordered today demonstrates NSR 64bpm, nonspecific T wave changes  Recent Labs: 03/26/2022: B Natriuretic Peptide 43.0 03/27/2022: ALT 17 03/29/2022: BUN 18; Creatinine, Ser 0.74; Hemoglobin 9.2; Magnesium 2.4; Platelets 270; Potassium 3.7; Sodium 140  Recent Lipid Panel No results found for: "CHOL", "TRIG", "HDL", "CHOLHDL", "VLDL", "LDLCALC", "LDLDIRECT"   Physical Exam:    VS:  BP (!) 160/70   Pulse 74   Ht '5\' 5"'$  (1.651 m)   Wt 256 lb (116.1 kg)   SpO2 95%   BMI 42.60 kg/m  Wt Readings from Last 3 Encounters:  04/12/22 256 lb (116.1 kg)  03/29/22 248 lb 1.6 oz (112.5 kg)  06/22/20 264 lb (119.7 kg)     GEN:  Well nourished, well developed in no acute distress HEENT: Normal NECK: No JVD; No carotid bruits LYMPHATICS: No lymphadenopathy CARDIAC: RRR, no murmurs, rubs, gallops RESPIRATORY:  Clear to auscultation without rales, wheezing or rhonchi  ABDOMEN: Soft, non-tender, non-distended MUSCULOSKELETAL:  No edema; No deformity  SKIN: Warm and dry NEUROLOGIC:  Alert and oriented x 3 PSYCHIATRIC:  Normal affect   ASSESSMENT:    1. Atrial fibrillation, unspecified type ([place])   2. Sinus pause   3. Essential hypertension   4. OSA (obstructive sleep apnea)    PLAN:    In order of problems listed above:  Persistent Afib  Patient is in normal sinus rhythm on EKG today.  Continue Eliquis 5 mg twice daily for anticoagulation. She is not on a rate controlling agent due to history of sinus pause. Hgb 9 on most recent admission. Hgb was 11 2 years ago. Continue to monitor CBC.  Sinus pauses History of sinus pause up to 7.8 seconds during sleep.  Previously evaluated by Dr. Rayann Heman and sleep study was recommended.  Patient is now using a CPAP nightly.  No further workup at this time.  HTN BP is  elevated today. I will increase losartan to 100 mg daily. We will revisit blood pressure at follow-up.  OSA Patient reports compliance with her CPAP.  Disposition: Follow up in 2 month(s) with Md/APP    Signed, Kaydenn Mclear Ninfa Meeker, PA-C  04/12/2022 2:52 PM    [facility]

## 2022-04-23 DIAGNOSIS — J9600 Acute respiratory failure, unspecified whether with hypoxia or hypercapnia: Secondary | ICD-10-CM | POA: Diagnosis not present

## 2022-05-24 DIAGNOSIS — J9600 Acute respiratory failure, unspecified whether with hypoxia or hypercapnia: Secondary | ICD-10-CM | POA: Diagnosis not present

## 2022-06-22 ENCOUNTER — Other Ambulatory Visit: Payer: Self-pay

## 2022-06-22 ENCOUNTER — Emergency Department (HOSPITAL_COMMUNITY): Payer: 59

## 2022-06-22 ENCOUNTER — Observation Stay (HOSPITAL_COMMUNITY)
Admission: EM | Admit: 2022-06-22 | Discharge: 2022-06-23 | Disposition: A | Discharge status: Home / Self Care | Payer: 59 | Attending: Family Medicine | Admitting: Family Medicine

## 2022-06-22 ENCOUNTER — Encounter (HOSPITAL_COMMUNITY): Payer: Self-pay

## 2022-06-22 DIAGNOSIS — J4541 Moderate persistent asthma with (acute) exacerbation: Secondary | ICD-10-CM | POA: Diagnosis not present

## 2022-06-22 DIAGNOSIS — J45901 Unspecified asthma with (acute) exacerbation: Secondary | ICD-10-CM | POA: Diagnosis present

## 2022-06-22 DIAGNOSIS — R0902 Hypoxemia: Secondary | ICD-10-CM | POA: Diagnosis not present

## 2022-06-22 DIAGNOSIS — I10 Essential (primary) hypertension: Secondary | ICD-10-CM | POA: Diagnosis not present

## 2022-06-22 DIAGNOSIS — I480 Paroxysmal atrial fibrillation: Secondary | ICD-10-CM | POA: Diagnosis not present

## 2022-06-22 DIAGNOSIS — E876 Hypokalemia: Secondary | ICD-10-CM | POA: Diagnosis not present

## 2022-06-22 DIAGNOSIS — Z79899 Other long term (current) drug therapy: Secondary | ICD-10-CM | POA: Insufficient documentation

## 2022-06-22 DIAGNOSIS — J45909 Unspecified asthma, uncomplicated: Secondary | ICD-10-CM | POA: Diagnosis not present

## 2022-06-22 DIAGNOSIS — R0602 Shortness of breath: Secondary | ICD-10-CM | POA: Diagnosis not present

## 2022-06-22 DIAGNOSIS — Z7901 Long term (current) use of anticoagulants: Secondary | ICD-10-CM | POA: Insufficient documentation

## 2022-06-22 DIAGNOSIS — U071 COVID-19: Secondary | ICD-10-CM | POA: Insufficient documentation

## 2022-06-22 DIAGNOSIS — J189 Pneumonia, unspecified organism: Secondary | ICD-10-CM | POA: Diagnosis present

## 2022-06-22 DIAGNOSIS — J208 Acute bronchitis due to other specified organisms: Principal | ICD-10-CM | POA: Diagnosis present

## 2022-06-22 LAB — COMPREHENSIVE METABOLIC PANEL
ALT: 20 U/L (ref 0–44)
AST: 23 U/L (ref 15–41)
Albumin: 3.4 g/dL — ABNORMAL LOW (ref 3.5–5.0)
Alkaline Phosphatase: 97 U/L (ref 38–126)
Anion gap: 11 (ref 5–15)
BUN: 8 mg/dL (ref 8–23)
CO2: 24 mmol/L (ref 22–32)
Calcium: 8.6 mg/dL — ABNORMAL LOW (ref 8.9–10.3)
Chloride: 104 mmol/L (ref 98–111)
Creatinine, Ser: 0.69 mg/dL (ref 0.44–1.00)
GFR, Estimated: >60 mL/min (ref >60)
Glucose, Bld: 114 mg/dL — ABNORMAL HIGH (ref 70–99)
Potassium: 3.2 mmol/L — ABNORMAL LOW (ref 3.5–5.1)
Sodium: 139 mmol/L (ref 135–145)
Total Bilirubin: 0.5 mg/dL (ref 0.3–1.2)
Total Protein: 7.3 g/dL (ref 6.5–8.1)

## 2022-06-22 LAB — CBC WITH DIFFERENTIAL/PLATELET
Abs Immature Granulocytes: 0.03 10*3/uL (ref 0.00–0.07)
Basophils Absolute: 0 10*3/uL (ref 0.0–0.1)
Basophils Relative: 0 %
Eosinophils Absolute: 0.2 10*3/uL (ref 0.0–0.5)
Eosinophils Relative: 3 %
HCT: 30.9 % — ABNORMAL LOW (ref 36.0–46.0)
Hemoglobin: 9.1 g/dL — ABNORMAL LOW (ref 12.0–15.0)
Immature Granulocytes: 0 %
Lymphocytes Relative: 10 %
Lymphs Abs: 0.8 10*3/uL (ref 0.7–4.0)
MCH: 23.3 pg — ABNORMAL LOW (ref 26.0–34.0)
MCHC: 29.4 g/dL — ABNORMAL LOW (ref 30.0–36.0)
MCV: 79.2 fL — ABNORMAL LOW (ref 80.0–100.0)
Monocytes Absolute: 0.7 10*3/uL (ref 0.1–1.0)
Monocytes Relative: 9 %
Neutro Abs: 5.9 10*3/uL (ref 1.7–7.7)
Neutrophils Relative %: 78 %
Platelets: 286 10*3/uL (ref 150–400)
RBC: 3.9 MIL/uL (ref 3.87–5.11)
RDW: 17.8 % — ABNORMAL HIGH (ref 11.5–15.5)
WBC: 7.7 10*3/uL (ref 4.0–10.5)
nRBC: 0 % (ref 0.0–0.2)

## 2022-06-22 LAB — BRAIN NATRIURETIC PEPTIDE: B Natriuretic Peptide: 199 pg/mL — ABNORMAL HIGH (ref 0.0–100.0)

## 2022-06-22 LAB — MAGNESIUM: Magnesium: 1.8 mg/dL (ref 1.7–2.4)

## 2022-06-22 LAB — SARS CORONAVIRUS 2 BY RT PCR: SARS Coronavirus 2 by RT PCR: POSITIVE — AB

## 2022-06-22 MED ORDER — ACETAMINOPHEN 650 MG RE SUPP: 650.0000 mg | Freq: Four times a day (QID) | RECTAL | Status: DC | PRN

## 2022-06-22 MED ORDER — ALBUTEROL SULFATE HFA 108 (90 BASE) MCG/ACT IN AERS: 2.0000 | INHALATION_SPRAY | RESPIRATORY_TRACT | Status: DC | PRN

## 2022-06-22 MED ORDER — OXYCODONE HCL 5 MG PO TABS: 5.0000 mg | ORAL_TABLET | ORAL | Status: DC | PRN

## 2022-06-22 MED ORDER — METHYLPREDNISOLONE SODIUM SUCC 125 MG IJ SOLR
60.0000 mg | Freq: Two times a day (BID) | INTRAMUSCULAR | Status: DC
  Administered 2022-06-22 – 2022-06-23 (×2): 60 mg via INTRAVENOUS
  Filled 2022-06-22 (×2): qty 2

## 2022-06-22 MED ORDER — ZOLPIDEM TARTRATE 5 MG PO TABS: 5.0000 mg | ORAL_TABLET | Freq: Every evening | ORAL | Status: DC | PRN

## 2022-06-22 MED ORDER — PREDNISONE 50 MG PO TABS: 50.0000 mg | ORAL_TABLET | Freq: Every day | ORAL | Status: DC

## 2022-06-22 MED ORDER — PREDNISONE 50 MG PO TABS
60.0000 mg | ORAL_TABLET | Freq: Once | ORAL | Status: AC
  Administered 2022-06-22: 60 mg via ORAL
  Filled 2022-06-22: qty 1

## 2022-06-22 MED ORDER — LOSARTAN POTASSIUM 50 MG PO TABS
100.0000 mg | ORAL_TABLET | Freq: Every day | ORAL | Status: DC
  Administered 2022-06-23: 100 mg via ORAL
  Filled 2022-06-22: qty 2

## 2022-06-22 MED ORDER — POTASSIUM CHLORIDE IN NACL 20-0.9 MEQ/L-% IV SOLN: INTRAVENOUS | Status: DC

## 2022-06-22 MED ORDER — MOMETASONE FURO-FORMOTEROL FUM 100-5 MCG/ACT IN AERO
2.0000 | INHALATION_SPRAY | Freq: Two times a day (BID) | RESPIRATORY_TRACT | Status: DC
  Administered 2022-06-23: 2 via RESPIRATORY_TRACT
  Filled 2022-06-22: qty 8.8

## 2022-06-22 MED ORDER — DEXTROMETHORPHAN POLISTIREX ER 30 MG/5ML PO SUER
30.0000 mg | Freq: Two times a day (BID) | ORAL | Status: DC | PRN
  Administered 2022-06-22: 30 mg via ORAL
  Filled 2022-06-22: qty 5

## 2022-06-22 MED ORDER — POTASSIUM CHLORIDE CRYS ER 20 MEQ PO TBCR
40.0000 meq | EXTENDED_RELEASE_TABLET | Freq: Once | ORAL | Status: AC
  Administered 2022-06-22: 40 meq via ORAL
  Filled 2022-06-22: qty 2

## 2022-06-22 MED ORDER — IPRATROPIUM-ALBUTEROL 0.5-2.5 (3) MG/3ML IN SOLN
3.0000 mL | Freq: Once | RESPIRATORY_TRACT | Status: AC
  Administered 2022-06-22: 3 mL via RESPIRATORY_TRACT
  Filled 2022-06-22: qty 3

## 2022-06-22 MED ORDER — PREDNISONE 20 MG PO TABS: 50.0000 mg | ORAL_TABLET | Freq: Every day | ORAL | Status: DC

## 2022-06-22 MED ORDER — ACETAMINOPHEN 325 MG PO TABS: 650.0000 mg | ORAL_TABLET | Freq: Four times a day (QID) | ORAL | Status: DC | PRN

## 2022-06-22 MED ORDER — NIRMATRELVIR/RITONAVIR (PAXLOVID)TABLET: 3.0000 | ORAL_TABLET | Freq: Two times a day (BID) | ORAL | Status: DC

## 2022-06-22 MED ORDER — FENTANYL CITRATE PF 50 MCG/ML IJ SOSY: 12.5000 ug | PREFILLED_SYRINGE | INTRAMUSCULAR | Status: DC | PRN

## 2022-06-22 MED ORDER — ALBUTEROL SULFATE (2.5 MG/3ML) 0.083% IN NEBU
2.5000 mg | INHALATION_SOLUTION | Freq: Once | RESPIRATORY_TRACT | Status: AC
  Administered 2022-06-22: 2.5 mg via RESPIRATORY_TRACT
  Filled 2022-06-22: qty 3

## 2022-06-22 MED ORDER — METHYLPREDNISOLONE SODIUM SUCC 500 MG IJ SOLR
0.5000 mg/kg | Freq: Two times a day (BID) | INTRAMUSCULAR | Status: DC
Start: 2022-06-22 — End: 2022-06-22

## 2022-06-22 MED ORDER — GUAIFENESIN ER 600 MG PO TB12
1200.0000 mg | ORAL_TABLET | Freq: Two times a day (BID) | ORAL | Status: DC
  Administered 2022-06-22 – 2022-06-23 (×3): 1200 mg via ORAL
  Filled 2022-06-22 (×3): qty 2

## 2022-06-22 MED ORDER — APIXABAN 5 MG PO TABS
5.0000 mg | ORAL_TABLET | Freq: Two times a day (BID) | ORAL | Status: DC
  Administered 2022-06-22 – 2022-06-23 (×2): 5 mg via ORAL
  Filled 2022-06-22 (×2): qty 1

## 2022-06-22 MED ORDER — IPRATROPIUM-ALBUTEROL 0.5-2.5 (3) MG/3ML IN SOLN
3.0000 mL | Freq: Four times a day (QID) | RESPIRATORY_TRACT | Status: DC
  Administered 2022-06-22 – 2022-06-23 (×3): 3 mL via RESPIRATORY_TRACT
  Filled 2022-06-22 (×3): qty 3

## 2022-06-22 NOTE — H&P (Addendum)
History and Physical  [facility]  LACREASHA JUMA Silva:[id number] DOB: 04-13-1950 DOA: 06/22/2022  PCP: Benita Stabile, MD  Patient coming from: Home  Level of care: Med-Surg  I have personally briefly reviewed patient's old medical records in [facility]  Chief Complaint: SOB   HPI: Carolyn Silva is a 72 year old female with history of asthma, paroxysmal atrial fibrillation, hypertension, chronically anticoagulated on apixaban, reportedly started having wheezing and shortness of breath symptoms with cough that is unproductive since yesterday around 4 PM.  She denies chest pain and palpitations.  Symptoms feel similar to asthma and bronchitis that she has had in the past.  She is vaccinated and boosted for COVID.  She presented presented to the emergency department complaining of shortness of breath.  She had very poor exercise tolerance.  She did not have a new oxygen requirement.  She was noticeably wheezing and coughing.  She tested positive for COVID-19.  Her chest x-ray did not show any infiltrates.  When they tried to get her up for ambulation she became very short of breath.  Admission was requested for observation and to start treatment for COVID.    Past Medical History:  Diagnosis Date   Asthma    Hypertension    OSA (obstructive sleep apnea)    Paroxysmal atrial fibrillation (HCC)    Paroxysmal atrial flutter [facility])     Past Surgical History:  Procedure Laterality Date   CARDIOVERSION N/A 07/11/2019   Procedure: CARDIOVERSION;  Surgeon: Jonelle Sidle, MD;  Location: [facility] ORS;  Service: Cardiovascular;  Laterality: N/A;   COLONOSCOPY N/A 09/14/2017   Procedure: COLONOSCOPY;  Surgeon: West Bali, MD;  Location: [facility] ENDO SUITE;  Service: Endoscopy;  Laterality: N/A;  2:00   TEE WITHOUT CARDIOVERSION N/A 07/11/2019   Procedure: TRANSESOPHAGEAL ECHOCARDIOGRAM (TEE) WITH PROPOFOL;  Surgeon: Jonelle Sidle, MD;  Location: [facility] ORS;  Service: Cardiovascular;   Laterality: N/A;   TUBAL LIGATION       reports that she has never smoked. She has never used smokeless tobacco. She reports current alcohol use. She reports that she does not use drugs.  Allergies  Allergen Reactions   Strawberry C [Ascorbic Acid] Hives    Family History  Problem Relation Age of Onset   Diabetes Mother    Cirrhosis Father    Diabetes Brother    Kidney disease Sister    Colon cancer Neg Hx    Colon polyps Neg Hx     Prior to Admission medications   Medication Sig Start Date End Date Taking? Authorizing Provider  apixaban (ELIQUIS) 5 MG TABS tablet Take 1 tablet (5 mg total) by mouth 2 (two) times daily. PT NEEDS APPT WITH DR MCDOWELL FOR FUTURE REFILLS. 03/16/22  Yes Jonelle Sidle, MD  losartan (COZAAR) 100 MG tablet Take 1 tablet (100 mg total) by mouth daily. 04/12/22 04/07/23 Yes Furth, Cadence H, PA-C  mometasone-formoterol (DULERA) 100-5 MCG/ACT AERO Inhale 2 puffs into the lungs in the morning and at bedtime. 03/29/22  Yes Rolly Salter, MD  albuterol (PROVENTIL) (2.5 MG/3ML) 0.083% nebulizer solution Take 3 mLs (2.5 mg total) by nebulization every 4 (four) hours as needed for wheezing or shortness of breath. Patient not taking: Reported on 06/22/2022 03/29/22   Rolly Salter, MD  albuterol (VENTOLIN HFA) 108 (90 Base) MCG/ACT inhaler Inhale 1-2 puffs into the lungs every 6 (six) hours as needed for wheezing or shortness of breath. Patient not taking: Reported on 06/22/2022  07/11/19   Julieanna Geraci L, MD  menthol-cetylpyridinium (CEPACOL) 3 MG lozenge Take 1 lozenge (3 mg total) by mouth as needed for sore throat. Patient not taking: Reported on 06/22/2022 03/29/22   Rolly Salter, MD  Nebulizer MISC 1 each by Does not apply route as directed. 03/29/22   Rolly Salter, MD    Physical Exam: Vitals:   06/22/22 1956 06/22/22 2104 06/22/22 2232 06/23/22 0607  BP:  (!) 153/74  (!) 158/94  Pulse:  95 74 90  Resp:  20 20   Temp:  98.6 F (37 C)  97.8 F  (36.6 C)  TempSrc:  Oral    SpO2: 91% 98% 98% 98%  Weight:      Height:        Constitutional: awake, alert, cooperative, NAD, calm, comfortable Eyes: PERRL, lids and conjunctivae normal ENMT: Mucous membranes are moist. Posterior pharynx clear of any exudate or lesions.Normal dentition.  Neck: normal, supple, no masses, no thyromegaly Respiratory: diffuse wheezing heard bilaterally.   Cardiovascular: normal s1, s2 sounds, no murmurs / rubs / gallops. No extremity edema. 2+ pedal pulses. No carotid bruits.  Abdomen: no tenderness, no masses palpated. No hepatosplenomegaly. Bowel sounds positive.  Musculoskeletal: no clubbing / cyanosis. No joint deformity upper and lower extremities. Good ROM, no contractures. Normal muscle tone.  Skin: no rashes, lesions, ulcers. No induration Neurologic: CN 2-12 grossly intact. Sensation intact, DTR normal. Strength 5/5 in all 4.  Psychiatric: Normal judgment and insight. Alert and oriented x 3. Normal mood.   Labs on Admission: I have personally reviewed following labs and imaging studies  CBC: Recent Labs  Lab 06/22/22 0805 06/23/22 0435  WBC 7.7 8.7  NEUTROABS 5.9  --   HGB 9.1* 8.9*  HCT 30.9* 30.5*  MCV 79.2* 79.8*  PLT 286 298   Basic Metabolic Panel: Recent Labs  Lab 06/22/22 0805 06/22/22 0822 06/23/22 0435  NA 139  --  140  K 3.2*  --  4.2  CL 104  --  108  CO2 24  --  25  GLUCOSE 114*  --  142*  BUN 8  --  11  CREATININE 0.69  --  0.61  CALCIUM 8.6*  --  9.0  MG  --  1.8 2.1   GFR: Estimated Creatinine Clearance: 83.3 mL/min (by C-G formula based on SCr of 0.61 mg/dL). Liver Function Tests: Recent Labs  Lab 06/22/22 0805  AST 23  ALT 20  ALKPHOS 97  BILITOT 0.5  PROT 7.3  ALBUMIN 3.4*   No results for input(s): "LIPASE", "AMYLASE" in the last 168 hours. No results for input(s): "AMMONIA" in the last 168 hours. Coagulation Profile: No results for input(s): "INR", "PROTIME" in the last 168 hours. Cardiac  Enzymes: No results for input(s): "CKTOTAL", "CKMB", "CKMBINDEX", "TROPONINI" in the last 168 hours. BNP (last 3 results) No results for input(s): "PROBNP" in the last 8760 hours. HbA1C: No results for input(s): "HGBA1C" in the last 72 hours. CBG: No results for input(s): "GLUCAP" in the last 168 hours. Lipid Profile: No results for input(s): "CHOL", "HDL", "LDLCALC", "TRIG", "CHOLHDL", "LDLDIRECT" in the last 72 hours. Thyroid Function Tests: No results for input(s): "TSH", "T4TOTAL", "FREET4", "T3FREE", "THYROIDAB" in the last 72 hours. Anemia Panel: No results for input(s): "VITAMINB12", "FOLATE", "FERRITIN", "TIBC", "IRON", "RETICCTPCT" in the last 72 hours. Urine analysis: No results found for: "COLORURINE", "APPEARANCEUR", "LABSPEC", "PHURINE", "GLUCOSEU", "HGBUR", "BILIRUBINUR", "KETONESUR", "PROTEINUR", "UROBILINOGEN", "NITRITE", "LEUKOCYTESUR"  Radiological Exams on Admission: DG Chest [facility]  1 View  Result Date: 06/22/2022 CLINICAL DATA:  Shortness of breath. EXAM: PORTABLE CHEST 1 VIEW COMPARISON:  03/26/2022 FINDINGS: Single-view of the chest again demonstrates enlarged central vascular structures. Heart size is upper limits of normal but stable. No overt pulmonary edema. No focal airspace disease. Negative for a pneumothorax. No acute bone abnormality. IMPRESSION: 1. Evidence for vascular congestion that appears chronic. No overt pulmonary edema. 2. No focal airspace disease. Electronically Signed   By: Richarda Overlie M.D.   On: 06/22/2022 08:37     Assessment/Plan Principal Problem:   Acute bronchitis due to 2019-nCoV Active Problems:   Asthma exacerbation   Paroxysmal atrial fibrillation (HCC)   Essential hypertension   Hypoxia   Hypokalemia   Acute bronchitis secondary to Covid Infection  - with history of asthma, we are treating aggressively to avoid exacerbation - continue IV steroids as ordered - continue scheduled bronchodilators - paxlovid contraindicated due to  being on apixaban   Covid Infection  - fortunately patient is vaccinated and boosted for covid - paxlovid contraindicated due to apixaban  - IV steroids as ordered for now with plan to transition to oral  - supportive measures as ordered    Paroxysmal atrial fibrillation - heart rate remains controlled - apixaban for full anticoagulation will be continued  Hypokalemia - check magnesium - oral and IV replacement ordered    DVT prophylaxis: apixaban   Code Status: full   Family Communication:   Disposition Plan: anticipate home 5/9   Consults called:   Admission status: OBS  Level of care: Med-Surg Standley Dakins MD [facility] How to contact the [facility] Attending or Consulting provider 7A - 7P or covering provider during after hours 7P -7A, for this patient?  Check the care team in [facility] and look for a) attending/consulting [facility] provider listed and b) the [facility] team listed Log into [email or web address] and use [place]'s universal password to access. If you do not have the password, please contact the hospital operator. Locate the [facility] provider you are looking for under [facility] and page to a number that you can be directly reached. If you still have difficulty reaching the provider, please page the [facility] (Director on Call) for the Hospitalists listed on amion for assistance.   If 7PM-7AM, please contact night-coverage [email or web address] Password [facility]  06/23/2022, 10:58 AM

## 2022-06-22 NOTE — Progress Notes (Signed)
Pt arrived to room 335 via WC from ED. Pt able to ambulate from [facility] to bed with only standby assistance. Pt with noted dyspnea with exertion and audible exp wheezes. Pt also with congested cough, productive of clear phlegm. Dyspnea resolved with rest.  Pt oriented to room and safety procedures. Pt states that she feels dizzy when she gets up, so pt advised to call for assistance before getting OOB. Pt states understanding. Call bell in reach, bed in low position.

## 2022-06-22 NOTE — Hospital Course (Signed)
72 year old female with history of asthma, paroxysmal atrial fibrillation, hypertension, chronically anticoagulated on apixaban, reportedly started having wheezing and shortness of breath symptoms with cough that is unproductive since yesterday around 4 PM.  She denies chest pain and palpitations.  Symptoms feel similar to asthma and bronchitis that she has had in the past.  She is vaccinated and boosted for COVID.  She presented presented to the emergency department complaining of shortness of breath.  She had very poor exercise tolerance.  She did not have a new oxygen requirement.  She was noticeably wheezing and coughing.  She tested positive for COVID-19.  Her chest x-ray did not show any infiltrates.  When they tried to get her up for ambulation she became very short of breath.  Admission was requested for observation and to start treatment for COVID.

## 2022-06-22 NOTE — ED Provider Notes (Signed)
[place] EMERGENCY DEPARTMENT AT [facility] Provider Note   CSN: [id number] Arrival date & time: 06/22/22  4098     History  Chief Complaint  Patient presents with   Respiratory Distress    Carolyn Silva is a 72 y.o. female.  HPI 72 year old female with a history of asthma as well as paroxysmal A-fib on Eliquis presents with shortness of breath.  Symptoms started yesterday afternoon around 4 PM.  She has had a cough, wheezing.  No chest pain or fever.  She is feeling short of breath.  She has had some leg swelling in her bilateral ankles this morning but thinks that is because she sat upright all night with her legs hanging down.  She previously has never had swelling like this before.  Denies feeling any palpitations.  This feels like prior bronchitis/asthma to her.  She is especially short of breath with any minimal exertion and the nurse noted that she was very short of breath just going from the wheelchair to the stretcher.  Home Medications Prior to Admission medications   Medication Sig Start Date End Date Taking? Authorizing Provider  apixaban (ELIQUIS) 5 MG TABS tablet Take 1 tablet (5 mg total) by mouth 2 (two) times daily. PT NEEDS APPT WITH DR MCDOWELL FOR FUTURE REFILLS. 03/16/22  Yes Jonelle Sidle, MD  losartan (COZAAR) 100 MG tablet Take 1 tablet (100 mg total) by mouth daily. 04/12/22 04/07/23 Yes Furth, Cadence H, PA-C  mometasone-formoterol (DULERA) 100-5 MCG/ACT AERO Inhale 2 puffs into the lungs in the morning and at bedtime. 03/29/22  Yes Rolly Salter, MD  albuterol (PROVENTIL) (2.5 MG/3ML) 0.083% nebulizer solution Take 3 mLs (2.5 mg total) by nebulization every 4 (four) hours as needed for wheezing or shortness of breath. Patient not taking: Reported on 06/22/2022 03/29/22   Rolly Salter, MD  albuterol (VENTOLIN HFA) 108 (90 Base) MCG/ACT inhaler Inhale 1-2 puffs into the lungs every 6 (six) hours as needed for wheezing or shortness of  breath. Patient not taking: Reported on 06/22/2022 07/11/19   Cleora Fleet, MD  menthol-cetylpyridinium (CEPACOL) 3 MG lozenge Take 1 lozenge (3 mg total) by mouth as needed for sore throat. Patient not taking: Reported on 06/22/2022 03/29/22   Rolly Salter, MD  Nebulizer MISC 1 each by Does not apply route as directed. 03/29/22   Rolly Salter, MD      Allergies    Strawberry c [ascorbic acid]    Review of Systems   Review of Systems  Constitutional:  Negative for fever.  Respiratory:  Positive for cough, shortness of breath and wheezing.   Cardiovascular:  Positive for leg swelling. Negative for chest pain.    Physical Exam Updated Vital Signs BP (!) 149/86 (BP Location: Right Arm)   Pulse (!) 106   Temp 98.9 F (37.2 C) (Oral)   Resp (!) 24   Ht 5\' 5"  (1.651 m)   Wt 115.9 kg   SpO2 97%   BMI 42.52 kg/m  Physical Exam Vitals and nursing note reviewed.  Constitutional:      General: She is not in acute distress.    Appearance: She is well-developed. She is obese. She is not ill-appearing or diaphoretic.  HENT:     Head: Normocephalic and atraumatic.  Cardiovascular:     Rate and Rhythm: Normal rate and regular rhythm.     Heart sounds: Normal heart sounds.  Pulmonary:     Effort: Pulmonary effort is normal.  Tachypnea present. No accessory muscle usage.     Breath sounds: Wheezing (diffuse, expiratory) present.  Musculoskeletal:     Right lower leg: Edema present.     Left lower leg: Edema present.     Comments: Mild bilateral ankle edema  Skin:    General: Skin is warm and dry.  Neurological:     Mental Status: She is alert.     ED Results / Procedures / Treatments   Labs (all labs ordered are listed, but only abnormal results are displayed) Labs Reviewed  SARS CORONAVIRUS 2 BY RT PCR - Abnormal; Notable for the following components:      Result Value   SARS Coronavirus 2 by RT PCR POSITIVE (*)    All other components within normal limits   COMPREHENSIVE METABOLIC PANEL - Abnormal; Notable for the following components:   Potassium 3.2 (*)    Glucose, Bld 114 (*)    Calcium 8.6 (*)    Albumin 3.4 (*)    All other components within normal limits  CBC WITH DIFFERENTIAL/PLATELET - Abnormal; Notable for the following components:   Hemoglobin 9.1 (*)    HCT 30.9 (*)    MCV 79.2 (*)    MCH 23.3 (*)    MCHC 29.4 (*)    RDW 17.8 (*)    All other components within normal limits  BRAIN NATRIURETIC PEPTIDE - Abnormal; Notable for the following components:   B Natriuretic Peptide 199.0 (*)    All other components within normal limits  MAGNESIUM  HIV ANTIBODY (ROUTINE TESTING W REFLEX)    EKG EKG Interpretation  Date/Time:  Wednesday Jun 22 2022 08:04:07 EDT Ventricular Rate:  99 PR Interval:    QRS Duration: 94 QT Interval:  359 QTC Calculation: 461 R Axis:   43 Text Interpretation: Atrial fibrillation Low voltage, precordial leads Borderline repolarization abnormality Confirmed by Pricilla Loveless [phone]) on 06/22/2022 8:06:35 AM  Radiology DG Chest Port 1 View  Result Date: 06/22/2022 CLINICAL DATA:  Shortness of breath. EXAM: PORTABLE CHEST 1 VIEW COMPARISON:  03/26/2022 FINDINGS: Single-view of the chest again demonstrates enlarged central vascular structures. Heart size is upper limits of normal but stable. No overt pulmonary edema. No focal airspace disease. Negative for a pneumothorax. No acute bone abnormality. IMPRESSION: 1. Evidence for vascular congestion that appears chronic. No overt pulmonary edema. 2. No focal airspace disease. Electronically Signed   By: Richarda Overlie M.D.   On: 06/22/2022 08:37    Procedures Procedures    Medications Ordered in ED Medications  losartan (COZAAR) tablet 100 mg (has no administration in time range)  apixaban (ELIQUIS) tablet 5 mg (has no administration in time range)  mometasone-formoterol (DULERA) 100-5 MCG/ACT inhaler 2 puff (2 puffs Inhalation Not Given 06/22/22 1148)  0.9 %  NaCl with KCl 20 mEq/ L  infusion ( Intravenous New Bag/Given 06/22/22 1433)  acetaminophen (TYLENOL) tablet 650 mg (has no administration in time range)    Or  acetaminophen (TYLENOL) suppository 650 mg (has no administration in time range)  oxyCODONE (Oxy IR/ROXICODONE) immediate release tablet 5 mg (has no administration in time range)  fentaNYL (SUBLIMAZE) injection 12.5 mcg (has no administration in time range)  zolpidem (AMBIEN) tablet 5 mg (has no administration in time range)  guaiFENesin (MUCINEX) 12 hr tablet 1,200 mg (1,200 mg Oral Given 06/22/22 1434)  ipratropium-albuterol (DUONEB) 0.5-2.5 (3) MG/3ML nebulizer solution 3 mL (3 mLs Nebulization Given 06/22/22 1351)  dextromethorphan (DELSYM) 30 MG/5ML liquid 30 mg (30 mg Oral Given  06/22/22 1435)  methylPREDNISolone sodium succinate (SOLU-MEDROL) 125 mg/2 mL injection 60 mg (has no administration in time range)    Followed by  predniSONE (DELTASONE) tablet 50 mg (has no administration in time range)  ipratropium-albuterol (DUONEB) 0.5-2.5 (3) MG/3ML nebulizer solution 3 mL (3 mLs Nebulization Given 06/22/22 0830)  albuterol (PROVENTIL) (2.5 MG/3ML) 0.083% nebulizer solution 2.5 mg (2.5 mg Nebulization Given 06/22/22 0830)  predniSONE (DELTASONE) tablet 60 mg (60 mg Oral Given 06/22/22 0820)  potassium chloride SA (KLOR-CON M) CR tablet 40 mEq (40 mEq Oral Given 06/22/22 0902)  albuterol (PROVENTIL) (2.5 MG/3ML) 0.083% nebulizer solution 2.5 mg (2.5 mg Nebulization Given 06/22/22 0917)  ipratropium-albuterol (DUONEB) 0.5-2.5 (3) MG/3ML nebulizer solution 3 mL (3 mLs Nebulization Given 06/22/22 1610)    ED Course/ Medical Decision Making/ A&P                             Medical Decision Making Amount and/or Complexity of Data Reviewed Labs: ordered.    Details: BNP mildly elevated but similar to other values.  Mild hypokalemia.  COVID test positive. Radiology: ordered and independent interpretation performed.    Details: No  pneumonia ECG/medicine tests: ordered and independent interpretation performed.    Details: A-fib  Risk Prescription drug management. Decision regarding hospitalization.   Patient presents with an asthma exacerbation.  Seems to be induced by COVID-19.  She is still a little tachypneic and while she is not hypoxic at rest, has significant dyspnea even when moving in the room after two 5 mg and 0.5 treatments of DuoNeb's.  Given this I think she should be admitted.  Have given her dose of prednisone as well.  Discussed with Dr. Laural Benes for admission.        Final Clinical Impression(s) / ED Diagnoses Final diagnoses:  Moderate persistent asthma with exacerbation  COVID-19    Rx / DC Orders ED Discharge Orders     None         Pricilla Loveless, MD 06/22/22 1524

## 2022-06-22 NOTE — ED Notes (Signed)
BSC placed in room for pt use and informed pt to call if she needs assistance

## 2022-06-22 NOTE — ED Triage Notes (Signed)
Pt c/o sob that started last night, pt with audible wheezing. Pt also states she has been having a productive cough. Does have bronchitis and asthma.

## 2022-06-23 DIAGNOSIS — I480 Paroxysmal atrial fibrillation: Secondary | ICD-10-CM | POA: Diagnosis not present

## 2022-06-23 DIAGNOSIS — J9600 Acute respiratory failure, unspecified whether with hypoxia or hypercapnia: Secondary | ICD-10-CM | POA: Diagnosis not present

## 2022-06-23 DIAGNOSIS — I10 Essential (primary) hypertension: Secondary | ICD-10-CM | POA: Diagnosis not present

## 2022-06-23 DIAGNOSIS — U071 COVID-19: Secondary | ICD-10-CM | POA: Diagnosis not present

## 2022-06-23 DIAGNOSIS — J208 Acute bronchitis due to other specified organisms: Secondary | ICD-10-CM | POA: Diagnosis not present

## 2022-06-23 LAB — CBC
HCT: 30.5 % — ABNORMAL LOW (ref 36.0–46.0)
Hemoglobin: 8.9 g/dL — ABNORMAL LOW (ref 12.0–15.0)
MCH: 23.3 pg — ABNORMAL LOW (ref 26.0–34.0)
MCHC: 29.2 g/dL — ABNORMAL LOW (ref 30.0–36.0)
MCV: 79.8 fL — ABNORMAL LOW (ref 80.0–100.0)
Platelets: 298 10*3/uL (ref 150–400)
RBC: 3.82 MIL/uL — ABNORMAL LOW (ref 3.87–5.11)
RDW: 17.6 % — ABNORMAL HIGH (ref 11.5–15.5)
WBC: 8.7 10*3/uL (ref 4.0–10.5)
nRBC: 0 % (ref 0.0–0.2)

## 2022-06-23 LAB — BASIC METABOLIC PANEL
Anion gap: 7 (ref 5–15)
BUN: 11 mg/dL (ref 8–23)
CO2: 25 mmol/L (ref 22–32)
Calcium: 9 mg/dL (ref 8.9–10.3)
Chloride: 108 mmol/L (ref 98–111)
Creatinine, Ser: 0.61 mg/dL (ref 0.44–1.00)
GFR, Estimated: >60 mL/min (ref >60)
Glucose, Bld: 142 mg/dL — ABNORMAL HIGH (ref 70–99)
Potassium: 4.2 mmol/L (ref 3.5–5.1)
Sodium: 140 mmol/L (ref 135–145)

## 2022-06-23 LAB — MAGNESIUM: Magnesium: 2.1 mg/dL (ref 1.7–2.4)

## 2022-06-23 LAB — HIV ANTIBODY (ROUTINE TESTING W REFLEX): HIV Screen 4th Generation wRfx: NONREACTIVE

## 2022-06-23 MED ORDER — ALBUTEROL SULFATE HFA 108 (90 BASE) MCG/ACT IN AERS: 2.0000 | INHALATION_SPRAY | RESPIRATORY_TRACT | 2 refills | Status: AC | PRN

## 2022-06-23 MED ORDER — APIXABAN 5 MG PO TABS
5.0000 mg | ORAL_TABLET | Freq: Two times a day (BID) | ORAL | 1 refills | Status: DC
Start: 2022-06-23 — End: 2022-09-21

## 2022-06-23 MED ORDER — GUAIFENESIN ER 600 MG PO TB12: 1200.0000 mg | ORAL_TABLET | Freq: Two times a day (BID) | ORAL | 0 refills | Status: AC

## 2022-06-23 MED ORDER — PREDNISONE 50 MG PO TABS: 50.0000 mg | ORAL_TABLET | Freq: Every day | ORAL | 0 refills | Status: DC

## 2022-06-23 MED ORDER — IPRATROPIUM-ALBUTEROL 0.5-2.5 (3) MG/3ML IN SOLN: 3.0000 mL | Freq: Three times a day (TID) | RESPIRATORY_TRACT | 0 refills | Status: AC

## 2022-06-23 MED ORDER — DEXTROMETHORPHAN POLISTIREX ER 30 MG/5ML PO SUER: 30.0000 mg | Freq: Two times a day (BID) | ORAL | 0 refills | Status: DC | PRN

## 2022-06-23 MED ORDER — DOXYCYCLINE HYCLATE 100 MG PO CAPS: 100.0000 mg | ORAL_CAPSULE | Freq: Two times a day (BID) | ORAL | 0 refills | Status: AC

## 2022-06-23 MED ORDER — HYDRALAZINE HCL 20 MG/ML IJ SOLN: 10.0000 mg | INTRAMUSCULAR | Status: DC | PRN

## 2022-06-23 NOTE — Progress Notes (Signed)
Pt discharged via WC to POV with personal belongings in her possession. °

## 2022-06-23 NOTE — TOC Progression Note (Signed)
  Transition of Care [facility]) Screening Note   Patient Details  Name: ONIESHA LASKO Date of Birth: August 22, 1950   Transition of Care [facility]) CM/SW Contact:    Elliot Gault, LCSW Phone Number: 06/23/2022, 10:09 AM    Transition of Care Department [facility]) has reviewed patient and no TOC needs have been identified at this time. We will continue to monitor patient advancement through interdisciplinary progression rounds. If new patient transition needs arise, please place a TOC consult.

## 2022-06-23 NOTE — Discharge Instructions (Signed)

## 2022-06-23 NOTE — Discharge Summary (Addendum)
Physician Discharge Summary  Carolyn Silva ZOX:[id number] DOB: May 09, 1950 DOA: 06/22/2022  PCP: Benita Stabile, MD  Admit date: 06/22/2022 Discharge date: 06/23/2022  Admitted From:  Home  Disposition: Home   Recommendations for Outpatient Follow-up:  Follow up with PCP in 1-2 weeks  Discharge Condition: STABLE   CODE STATUS: FULL DIET: resume prior home diet    Brief Hospitalization Summary: Please see all hospital notes, images, labs for full details of the hospitalization. ADMISSION PROVIDER HPI:  72 year old female with history of asthma, paroxysmal atrial fibrillation, hypertension, chronically anticoagulated on apixaban, reportedly started having wheezing and shortness of breath symptoms with cough that is unproductive since yesterday around 4 PM.  She denies chest pain and palpitations.  Symptoms feel similar to asthma and bronchitis that she has had in the past.  She is vaccinated and boosted for COVID.  She presented presented to the emergency department complaining of shortness of breath.  She had very poor exercise tolerance.  She did not have a new oxygen requirement.  She was noticeably wheezing and coughing.  She tested positive for COVID-19.  Her chest x-ray did not show any infiltrates.  When they tried to get her up for ambulation she became very short of breath.  Admission was requested for observation and to start treatment for COVID.  HOSPITAL COURSE   Pt was admitted for IV steroids and scheduled bronchodilator therapy.   Pt was unable to receive Paxlovid due to concurrently being on apixaban.  This was discussed with pharm D. Pt has had no supplemental oxygen requirement and patient has been responding well to IV steroids and scheduled bronchodilators.  She remains on room air, talking on phone, ambulating in room, no distress noted.  She will discharge home today.  She will discharge with Rx of prednisone to complete full 10 day course.  She is vaccinated and boosted for  Covid.  She was given supportive treatments.  She reports she has a nebulizer at home.  She will be given Rx for Duoneb bronchodilators for home use.  She is stable to discharge home today and follow up with her primary care provider on an outpatient basis.    Discharge Diagnoses:  Principal Problem:   Acute bronchitis due to 2019-nCoV Active Problems:   Asthma exacerbation   Paroxysmal atrial fibrillation [facility])   Essential hypertension   Hypoxia   Hypokalemia   Discharge Instructions:  Allergies as of 06/23/2022       Reactions   Strawberry C [ascorbic Acid] Hives        Medication List     STOP taking these medications    menthol-cetylpyridinium 3 MG lozenge Commonly known as: CEPACOL       TAKE these medications    albuterol 108 (90 Base) MCG/ACT inhaler Commonly known as: VENTOLIN HFA Inhale 2 puffs into the lungs every 4 (four) hours as needed for wheezing or shortness of breath. What changed:  how much to take when to take this Another medication with the same name was removed. Continue taking this medication, and follow the directions you see here.   apixaban 5 MG Tabs tablet Commonly known as: Eliquis Take 1 tablet (5 mg total) by mouth 2 (two) times daily. PT NEEDS APPT WITH DR MCDOWELL FOR FUTURE REFILLS.   dextromethorphan 30 MG/5ML liquid Commonly known as: DELSYM Take 5 mLs (30 mg total) by mouth 2 (two) times daily as needed for cough.   doxycycline 100 MG capsule Commonly known as: VIBRAMYCIN Take  1 capsule (100 mg total) by mouth 2 (two) times daily for 3 days.   guaiFENesin 600 MG 12 hr tablet Commonly known as: MUCINEX Take 2 tablets (1,200 mg total) by mouth 2 (two) times daily for 4 days.   ipratropium-albuterol 0.5-2.5 (3) MG/3ML Soln Commonly known as: DUONEB Take 3 mLs by nebulization in the morning, at noon, and at bedtime.   losartan 100 MG tablet Commonly known as: COZAAR Take 1 tablet (100 mg total) by mouth daily.    mometasone-formoterol 100-5 MCG/ACT Aero Commonly known as: DULERA Inhale 2 puffs into the lungs in the morning and at bedtime.   Nebulizer Misc 1 each by Does not apply route as directed.   predniSONE 50 MG tablet Commonly known as: DELTASONE Take 1 tablet (50 mg total) by mouth daily with breakfast for 8 days.               Durable Medical Equipment  (From admission, onward)           Start     Ordered   06/23/22 1124  For home use only DME Nebulizer machine  Once       Question Answer Comment  Patient needs a nebulizer to treat with the following condition COPD (chronic obstructive pulmonary disease) (HCC)   Length of Need Lifetime      06/23/22 1123            Follow-up Information     Benita Stabile, MD. Schedule an appointment as soon as possible for a visit in 2 week(s).   Specialty: Internal Medicine Why: Hospital Follow Up Contact information: [address] [place] [facility] [address] [phone]                Allergies  Allergen Reactions   Strawberry C [Ascorbic Acid] Hives   Allergies as of 06/23/2022       Reactions   Strawberry C [ascorbic Acid] Hives        Medication List     STOP taking these medications    menthol-cetylpyridinium 3 MG lozenge Commonly known as: CEPACOL       TAKE these medications    albuterol 108 (90 Base) MCG/ACT inhaler Commonly known as: VENTOLIN HFA Inhale 2 puffs into the lungs every 4 (four) hours as needed for wheezing or shortness of breath. What changed:  how much to take when to take this Another medication with the same name was removed. Continue taking this medication, and follow the directions you see here.   apixaban 5 MG Tabs tablet Commonly known as: Eliquis Take 1 tablet (5 mg total) by mouth 2 (two) times daily. PT NEEDS APPT WITH DR MCDOWELL FOR FUTURE REFILLS.   dextromethorphan 30 MG/5ML liquid Commonly known as: DELSYM Take 5 mLs (30 mg total) by mouth 2 (two)  times daily as needed for cough.   doxycycline 100 MG capsule Commonly known as: VIBRAMYCIN Take 1 capsule (100 mg total) by mouth 2 (two) times daily for 3 days.   guaiFENesin 600 MG 12 hr tablet Commonly known as: MUCINEX Take 2 tablets (1,200 mg total) by mouth 2 (two) times daily for 4 days.   ipratropium-albuterol 0.5-2.5 (3) MG/3ML Soln Commonly known as: DUONEB Take 3 mLs by nebulization in the morning, at noon, and at bedtime.   losartan 100 MG tablet Commonly known as: COZAAR Take 1 tablet (100 mg total) by mouth daily.   mometasone-formoterol 100-5 MCG/ACT Aero Commonly known as: DULERA Inhale 2 puffs  into the lungs in the morning and at bedtime.   Nebulizer Misc 1 each by Does not apply route as directed.   predniSONE 50 MG tablet Commonly known as: DELTASONE Take 1 tablet (50 mg total) by mouth daily with breakfast for 8 days.               Durable Medical Equipment  (From admission, onward)           Start     Ordered   06/23/22 1124  For home use only DME Nebulizer machine  Once       Question Answer Comment  Patient needs a nebulizer to treat with the following condition COPD (chronic obstructive pulmonary disease) (HCC)   Length of Need Lifetime      06/23/22 1123            Procedures/Studies: DG Chest Port 1 View  Result Date: 06/22/2022 CLINICAL DATA:  Shortness of breath. EXAM: PORTABLE CHEST 1 VIEW COMPARISON:  03/26/2022 FINDINGS: Single-view of the chest again demonstrates enlarged central vascular structures. Heart size is upper limits of normal but stable. No overt pulmonary edema. No focal airspace disease. Negative for a pneumothorax. No acute bone abnormality. IMPRESSION: 1. Evidence for vascular congestion that appears chronic. No overt pulmonary edema. 2. No focal airspace disease. Electronically Signed   By: Richarda Overlie M.D.   On: 06/22/2022 08:37     Subjective: Pt still having some wheezing but remains on room air, talking  on phone, ambulating in room, no distress;   Discharge Exam: Vitals:   06/22/22 2232 06/23/22 0607  BP:  (!) 158/94  Pulse: 74 90  Resp: 20   Temp:  97.8 F (36.6 C)  SpO2: 98% 98%   Vitals:   06/22/22 1956 06/22/22 2104 06/22/22 2232 06/23/22 0607  BP:  (!) 153/74  (!) 158/94  Pulse:  95 74 90  Resp:  20 20   Temp:  98.6 F (37 C)  97.8 F (36.6 C)  TempSrc:  Oral    SpO2: 91% 98% 98% 98%  Weight:      Height:       General: Pt is alert, awake, not in acute distress Cardiovascular: RRR, S1/S2 +, no rubs, no gallops Respiratory: bilateral expiratory wheezing, good air movement bilateral, no rhonchi Abdominal: Soft, NT, ND, bowel sounds + Extremities: no edema, no cyanosis   The results of significant diagnostics from this hospitalization (including imaging, microbiology, ancillary and laboratory) are listed below for reference.     Microbiology: Recent Results (from the past 240 hour(s))  SARS Coronavirus 2 by RT PCR (hospital order, performed in [facility] hospital lab) *cepheid single result test* Anterior Nasal Swab     Status: Abnormal   Collection Time: 06/22/22  8:42 AM   Specimen: Anterior Nasal Swab  Result Value Ref Range Status   SARS Coronavirus 2 by RT PCR POSITIVE (A) NEGATIVE Final    Comment: (NOTE) SARS-CoV-2 target nucleic acids are DETECTED  SARS-CoV-2 RNA is generally detectable in upper respiratory specimens  during the acute phase of infection.  Positive results are indicative  of the presence of the identified virus, but do not rule out bacterial infection or co-infection with other pathogens not detected by the test.  Clinical correlation with patient history and  other diagnostic information is necessary to determine patient infection status.  The expected result is negative.  Fact Sheet for Patients:   RoadLapTop.co.za   Fact Sheet for Healthcare Providers:  http://kim-miller.com/    This  test is not yet approved or cleared by the [place] and  has been authorized for detection and/or diagnosis of SARS-CoV-2 by FDA under an Emergency Use Authorization (EUA).  This EUA will remain in effect (meaning this test can be used) for the duration of  the COVID-19 declaration under Section 564(b)(1)  of the Act, 21 U.S.C. section 360-bbb-3(b)(1), unless the authorization is terminated or revoked sooner.   Performed at [facility], [address], [place], [place] [address]      Labs: BNP (last 3 results) Recent Labs    03/26/22 2029 06/22/22 0805  BNP 43.0 199.0*   Basic Metabolic Panel: Recent Labs  Lab 06/22/22 0805 06/22/22 0822 06/23/22 0435  NA 139  --  140  K 3.2*  --  4.2  CL 104  --  108  CO2 24  --  25  GLUCOSE 114*  --  142*  BUN 8  --  11  CREATININE 0.69  --  0.61  CALCIUM 8.6*  --  9.0  MG  --  1.8 2.1   Liver Function Tests: Recent Labs  Lab 06/22/22 0805  AST 23  ALT 20  ALKPHOS 97  BILITOT 0.5  PROT 7.3  ALBUMIN 3.4*   No results for input(s): "LIPASE", "AMYLASE" in the last 168 hours. No results for input(s): "AMMONIA" in the last 168 hours. CBC: Recent Labs  Lab 06/22/22 0805 06/23/22 0435  WBC 7.7 8.7  NEUTROABS 5.9  --   HGB 9.1* 8.9*  HCT 30.9* 30.5*  MCV 79.2* 79.8*  PLT 286 298   Cardiac Enzymes: No results for input(s): "CKTOTAL", "CKMB", "CKMBINDEX", "TROPONINI" in the last 168 hours. BNP: Invalid input(s): "POCBNP" CBG: No results for input(s): "GLUCAP" in the last 168 hours. D-Dimer No results for input(s): "DDIMER" in the last 72 hours. Hgb A1c No results for input(s): "HGBA1C" in the last 72 hours. Lipid Profile No results for input(s): "CHOL", "HDL", "LDLCALC", "TRIG", "CHOLHDL", "LDLDIRECT" in the last 72 hours. Thyroid function studies No results for input(s): "TSH", "T4TOTAL", "T3FREE", "THYROIDAB" in the last 72 hours.  Invalid input(s): "FREET3" Anemia work up No results for input(s):  "VITAMINB12", "FOLATE", "FERRITIN", "TIBC", "IRON", "RETICCTPCT" in the last 72 hours. Urinalysis No results found for: "COLORURINE", "APPEARANCEUR", "LABSPEC", "PHURINE", "GLUCOSEU", "HGBUR", "BILIRUBINUR", "KETONESUR", "PROTEINUR", "UROBILINOGEN", "NITRITE", "LEUKOCYTESUR" Sepsis Labs Recent Labs  Lab 06/22/22 0805 06/23/22 0435  WBC 7.7 8.7   Microbiology Recent Results (from the past 240 hour(s))  SARS Coronavirus 2 by RT PCR (hospital order, performed in [facility] hospital lab) *cepheid single result test* Anterior Nasal Swab     Status: Abnormal   Collection Time: 06/22/22  8:42 AM   Specimen: Anterior Nasal Swab  Result Value Ref Range Status   SARS Coronavirus 2 by RT PCR POSITIVE (A) NEGATIVE Final    Comment: (NOTE) SARS-CoV-2 target nucleic acids are DETECTED  SARS-CoV-2 RNA is generally detectable in upper respiratory specimens  during the acute phase of infection.  Positive results are indicative  of the presence of the identified virus, but do not rule out bacterial infection or co-infection with other pathogens not detected by the test.  Clinical correlation with patient history and  other diagnostic information is necessary to determine patient infection status.  The expected result is negative.  Fact Sheet for Patients:   RoadLapTop.co.za   Fact Sheet for Healthcare Providers:   http://kim-miller.com/    This test is not yet approved or  cleared by the [place] and  has been authorized for detection and/or diagnosis of SARS-CoV-2 by FDA under an Emergency Use Authorization (EUA).  This EUA will remain in effect (meaning this test can be used) for the duration of  the COVID-19 declaration under Section 564(b)(1)  of the Act, 21 U.S.C. section 360-bbb-3(b)(1), unless the authorization is terminated or revoked sooner.   Performed at [facility], [address] [place], [place] [address]    Time  coordinating discharge: 41 mins   SIGNED:  Standley Dakins, MD  [facility] 06/23/2022, 11:36 AM How to contact the [facility] Attending or Consulting provider 7A - 7P or covering provider during after hours 7P -7A, for this patient?  Check the care team in [facility] and look for a) attending/consulting TRH provider listed and b) the [facility] team listed Log into [email or web address] and use [facility] universal password to access. If you do not have the password, please contact the hospital operator. Locate the [facility] provider you are looking for under [facility] and page to a number that you can be directly reached. If you still have difficulty reaching the provider, please page the [facility] (Director on Call) for the Hospitalists listed on amion for assistance.

## 2022-06-23 NOTE — Plan of Care (Signed)

## 2022-06-23 NOTE — TOC Transition Note (Signed)
Transition of Care [facility]) - CM/SW Discharge Note   Patient Details  Name: Carolyn Silva MRN: [id number] Date of Birth: 11-11-1950  Transition of Care [facility] CM/SW Contact:  Elliot Gault, LCSW Phone Number: 06/23/2022, 11:33 AM   Clinical Narrative:     Pt admitted from home. Neb machine ordered for dc. Spoke with pt to assess and assist with dc. Pt states she will return home. She requested [employer] from [employer].  TOC attempted to order neb machine from [place] but was informed that they way pt's insurance works, they would have to provide it as a rental through [employer] and they do not do that anymore. Only option would be for pt to purchase one for 39.99.  Updated pt and offered to refer to another provider. Pt agreeable to referral to [facility]. Referral made with request for delivery to pt's home.  No other TOC needs for dc.  Expected Discharge Plan: Home/Self Care Barriers to Discharge: Barriers Resolved   Patient Goals and CMS Choice Patient states their goals for this hospitalization and ongoing recovery are:: go home CMS Medicare.gov Compare Post Acute Care list provided to:: Patient Choice offered to / list presented to : Patient  Expected Discharge Plan and Services Expected Discharge Plan: Home/Self Care In-house Referral: Clinical Social Work   Post Acute Care Choice: Durable Medical Equipment Living arrangements for the past 2 months: Apartment Expected Discharge Date: 06/23/22               DME Arranged: Nebulizer machine DME Agency: Patsy Lager Date DME Agency Contacted: 06/23/22   Representative spoke with at DME Agency: Morrie Sheldon            Prior Living Arrangements/Services Living arrangements for the past 2 months: Apartment Lives with:: Self Patient language and need for interpreter reviewed:: Yes Do you feel safe going back to the place where you live?: Yes      Need for Family Participation in Patient Care: No  (Comment)     Criminal Activity/Legal Involvement Pertinent to Current Situation/Hospitalization: No - Comment as needed  Activities of Daily Living Home Assistive Devices/Equipment: CPAP ADL Screening (condition at time of admission) Patient's cognitive ability adequate to safely complete daily activities?: Yes Is the patient deaf or have difficulty hearing?: No Does the patient have difficulty seeing, even when wearing glasses/contacts?: No Does the patient have difficulty concentrating, remembering, or making decisions?: No Patient able to express need for assistance with ADLs?: Yes Does the patient have difficulty dressing or bathing?: No Independently performs ADLs?: Yes (appropriate for developmental age) Does the patient have difficulty walking or climbing stairs?: No Weakness of Legs: None Weakness of Arms/Hands: None  Permission Sought/Granted Permission sought to share information with : Facility Industrial/product designer granted to share information with : Yes, Verbal Permission Granted     Permission granted to share info w AGENCY: DME        Emotional Assessment Appearance:: Appears stated age Attitude/Demeanor/Rapport: Engaged Affect (typically observed): Pleasant Orientation: : Oriented to Self, Oriented to Place, Oriented to  Time, Oriented to Situation Alcohol / Substance Use: Not Applicable Psych Involvement: No (comment)  Admission diagnosis:  Acute bronchitis due to 2019-nCoV [U07.1, J20.8] Patient Active Problem List   Diagnosis Date Noted   Acute bronchitis due to 2019-nCoV 06/22/2022   Acute asthma exacerbation 03/26/2022   RSV (respiratory syncytial virus infection) 03/26/2022   Hypokalemia 03/26/2022   Acute on chronic anemia 03/26/2022   Prolonged QT interval 03/26/2022  Acute respiratory failure with hypercapnia (HCC) 07/13/2019   Hypoxia    Asthma exacerbation 07/09/2019   Paroxysmal atrial fibrillation (HCC) 07/09/2019   Essential  hypertension 07/09/2019   Special screening for malignant neoplasms, colon    PCP:  Benita Stabile, MD Pharmacy:   [facility] [place] [place] - [address] [place] [address] Phone: [phone] Fax: [phone]     Social Determinants of Health (SDOH) Interventions    Readmission Risk Interventions     No data to display           Final next level of care: Home/Self Care Barriers to Discharge: Barriers Resolved   Patient Goals and CMS Choice CMS Medicare.gov Compare Post Acute Care list provided to:: Patient Choice offered to / list presented to : Patient  Discharge Placement                         Discharge Plan and Services Additional resources added to the After Visit Summary for   In-house Referral: Clinical Social Work   Post Acute Care Choice: Durable Medical Equipment          DME Arranged: Nebulizer machine DME Agency: Patsy Lager Date DME Agency Contacted: 06/23/22   Representative spoke with at DME Agency: Morrie Sheldon            Social Determinants of Health (SDOH) Interventions SDOH Screenings   Food Insecurity: No Food Insecurity (06/22/2022)  Housing: Low Risk  (06/22/2022)  Transportation Needs: No Transportation Needs (06/22/2022)  Utilities: Not At Risk (06/22/2022)  Depression (PHQ2-9): Low Risk  (07/26/2019)  Tobacco Use: Low Risk  (06/22/2022)     Readmission Risk Interventions     No data to display

## 2022-06-27 NOTE — Progress Notes (Unsigned)
Cardiology Office Note  Date: 06/28/2022   ID: Carolyn Silva, DOB Apr 14, 1950, MRN [id number]  History of Present Illness: Carolyn Silva is a 72 y.o. female last seen in February by Ms. Lorna Few, I reviewed the note (I last saw her during hospitalization in 2021).  Records indicate recent hospitalization with hypoxic respiratory failure in the setting of COVID-19.  She was observed in the hospital, could not be treated with Paxlovid due to concurrent use of Eliquis.  She did well on IV steroids and bronchodilators.  She was noted to be in atrial fibrillation by ECG at that time, duration uncertain but she was in sinus rhythm as of February.  She is here today for follow-up.  States that she is feeling better, still with intermittent cough and wheezing.  No chest pain or sense of palpitations.  No dizziness or syncope.  We went over her medications, she reports compliance with Eliquis and has remained on losartan for treatment of hypertension.  No AV nodal blockers with reasonable heart rate in atrial fibrillation and prior history of significant nocturnal pauses.  Physical Exam: VS:  BP (!) 144/82   Pulse 96   Ht 5\' 5"  (1.651 m)   Wt 260 lb (117.9 kg)   SpO2 97%   BMI 43.27 kg/m , BMI Body mass index is 43.27 kg/m.  Wt Readings from Last 3 Encounters:  06/28/22 260 lb (117.9 kg)  06/22/22 255 lb 8 oz (115.9 kg)  04/12/22 256 lb (116.1 kg)    General: Patient appears comfortable at rest. HEENT: Conjunctiva and lids normal. Neck: Supple, no elevated JVP. Lungs: Prolonged expiratory phase with mild expiratory wheeze, no egophony. Cardiac: Irregularly irregular without significant murmur or gallop. Extremities: No pitting edema.  ECG:  An ECG dated 06/22/2022 was personally reviewed today and demonstrated:  Atrial fibrillation with low voltage and nonspecific T wave changes.  Labwork: 06/22/2022: ALT 20; AST 23; B Natriuretic Peptide 199.0 06/23/2022: BUN 11; Creatinine, Ser  0.61; Hemoglobin 8.9; Magnesium 2.1; Platelets 298; Potassium 4.2; Sodium 140   Other Studies Reviewed Today:  Chest x-ray 06/22/2022: FINDINGS: Single-view of the chest again demonstrates enlarged central vascular structures. Heart size is upper limits of normal but stable. No overt pulmonary edema. No focal airspace disease. Negative for a pneumothorax. No acute bone abnormality.   IMPRESSION: 1. Evidence for vascular congestion that appears chronic. No overt pulmonary edema. 2. No focal airspace disease.  Assessment and Plan:  1.  Paroxysmal atrial fibrillation and flutter.  CHA2DS2-VASc score is 3.  Last cardioversion was in 2021 and she was in sinus rhythm by ECG in February.  LVEF 70 to 75% by echocardiogram in May 2021.  Left atrial size described as normal at that time.  She is in atrial fibrillation now with heart rate under 100 at rest and asymptomatic in terms of palpitations.  Onset uncertain but suspicious that it may have been concurrent with recent COVID-19 diagnosis.  For now would continue Eliquis, we will bring her back in the next few weeks and if she remains in atrial fibrillation, outpatient cardioversion can be scheduled.  So far she has not required antiarrhythmic therapy.  Would likely avoid amiodarone with history of asthmatic bronchitis and chronic lung disease.  Might be worth considering Multaq.  2.  OSA on CPAP.  3.  Essential hypertension.  Continue Cozaar at current dose.  4.  History of nocturnal sinus pauses, not clearly symptomatic.  Disposition:  Follow up  2 to  3 weeks.  Signed, Jonelle Sidle, M.D., F.A.C.C. [facility]

## 2022-06-28 ENCOUNTER — Encounter: Payer: Self-pay | Admitting: Cardiology

## 2022-06-28 ENCOUNTER — Ambulatory Visit: Payer: 59 | Attending: Cardiology | Admitting: Cardiology

## 2022-06-28 VITALS — BP 144/82 | HR 96 | Ht 65.0 in | Wt 260.0 lb

## 2022-06-28 DIAGNOSIS — I4819 Other persistent atrial fibrillation: Secondary | ICD-10-CM | POA: Diagnosis not present

## 2022-06-28 DIAGNOSIS — I10 Essential (primary) hypertension: Secondary | ICD-10-CM

## 2022-06-28 NOTE — Patient Instructions (Signed)
Medication Instructions:   Your physician recommends that you continue on your current medications as directed. Please refer to the Current Medication list given to you today.  Labwork: None today  Testing/Procedures: None today  Follow-Up: 2-3 weeks  Any Other Special Instructions Will Be Listed Below (If Applicable).  If you need a refill on your cardiac medications before your next appointment, please call your pharmacy.

## 2022-07-21 DIAGNOSIS — J449 Chronic obstructive pulmonary disease, unspecified: Secondary | ICD-10-CM | POA: Diagnosis not present

## 2022-07-24 DIAGNOSIS — J9600 Acute respiratory failure, unspecified whether with hypoxia or hypercapnia: Secondary | ICD-10-CM | POA: Diagnosis not present

## 2022-07-26 ENCOUNTER — Encounter: Payer: Self-pay | Admitting: Student

## 2022-07-26 ENCOUNTER — Encounter: Payer: Self-pay | Admitting: *Deleted

## 2022-07-26 ENCOUNTER — Ambulatory Visit: Payer: 59 | Attending: Student | Admitting: Student

## 2022-07-26 VITALS — BP 118/60 | HR 84 | Ht 65.0 in | Wt 254.2 lb

## 2022-07-26 DIAGNOSIS — I10 Essential (primary) hypertension: Secondary | ICD-10-CM

## 2022-07-26 DIAGNOSIS — I4819 Other persistent atrial fibrillation: Secondary | ICD-10-CM

## 2022-07-26 DIAGNOSIS — G4733 Obstructive sleep apnea (adult) (pediatric): Secondary | ICD-10-CM | POA: Diagnosis not present

## 2022-07-26 NOTE — Progress Notes (Signed)
Cardiology Office Note    Date:  07/26/2022  ID:  Carolyn Silva, DOB 12-Feb-1951, MRN [id number] Cardiologist: Nona Dell, MD    History of Present Illness:    Carolyn Silva is a 72 y.o. female with past medical history of persistent atrial fibrillation/flutter (s/p DCCVx2 in 06/2019), nocturnal pauses (occurred prior to CPAP use), OSA, HTN and asthma who presents to the office today for 3-week follow-up.  She was recently examined by Dr. Diona Silva on 06/28/2022 for hospital follow-up from a recent admission for acute hypoxic respiratory failure in the setting of COVID-19. Was found to be in atrial fibrillation during admission and at the time of her office visit, her rates were controlled in the 90's. She was continued on Eliquis for anticoagulation and was not started on AV nodal blocking agents given prior significant pauses with this in the past. It was recommend to arrange for follow-up in a few weeks and if still in atrial fibrillation, a DCCV could be arranged.  In talking with the patient today, she reports her respiratory status has significantly improved since her last office visit. Feels that her breathing is back to baseline denies any recent dyspnea on exertion, orthopnea, PND or pitting edema. No recent chest pain or palpitations. She is overall asymptomatic with her atrial fibrillation at this time. She reports good compliance with Eliquis and denies missing any recent doses. No reports of active bleeding.  Studies Reviewed:   EKG: EKG is ordered today and demonstrates rate-controlled atrial fibrillation, heart rate 84 with no acute ST abnormalities.  Echocardiogram: 06/2019 IMPRESSIONS     1. Left ventricular ejection fraction, by estimation, is 70 to 75%. The  left ventricle has hyperdynamic function. The left ventricle has no  regional wall motion abnormalities. There is moderate left ventricular  hypertrophy. Left ventricular diastolic  parameters are  indeterminate.   2. Right ventricular systolic function is normal. The right ventricular  size is normal. Tricuspid regurgitation signal is inadequate for assessing  PA pressure.   3. Right atrial size was upper normal.   4. The mitral valve is grossly normal. Trivial mitral valve  regurgitation.   5. The aortic valve is tricuspid. Aortic valve regurgitation is not  visualized.   6. The inferior vena cava is dilated in size with >50% respiratory  variability, suggesting right atrial pressure of 8 mmHg.    Event Monitor: 08/2019 ZIO AT reviewed. 13 days 23 hours analyzed. Predominant rhythm is sinus with heart rate ranging from 38 bpm up to 136 bpm and average heart rate 73 bpm. There were rare PACs representing less than 1% of total beats. Occasional PVCs noted representing 2.8% of total beats. NSVT observed, longest episode was 5 beats. Also PSVT noted, longest episode lasted for 25 seconds at a heart rate average of 158 bpm. There were also multiple pauses observed, the longest of which lasted 7.4 seconds. Episodes of Wenkebach conduction also observed. 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   Risk Assessment/Calculations:    CHA2DS2-VASc Score = 3   This indicates a 3.2% annual risk of stroke. The patient's score is based upon: CHF History: 0 HTN History: 1 Diabetes History: 0 Stroke History: 0 Vascular Disease History: 0 Age Score: 1 Gender Score: 1    Physical Exam:   VS:  BP 118/60   Pulse 84   Ht 5\' 5"  (1.651 m)   Wt 254 lb 3.2 oz (115.3 kg)   SpO2 98%   BMI 42.30 kg/m  Wt Readings from Last 3 Encounters:  07/26/22 254 lb 3.2 oz (115.3 kg)  06/28/22 260 lb (117.9 kg)  06/22/22 255 lb 8 oz (115.9 kg)     GEN: Pleasant female appearing in no acute distress NECK: No JVD; No carotid bruits CARDIAC: Irregularly irregular, no murmurs, rubs, gallops RESPIRATORY:  Clear to auscultation without rales, wheezing or rhonchi  ABDOMEN: Appears  non-distended. No obvious abdominal masses. EXTREMITIES: No clubbing or cyanosis. No pitting edema.  Distal pedal pulses are 2+ bilaterally.   Assessment and Plan:   1. Persistent Atrial Fibrillation/Flutter - She previously underwent cardioversion x 2 in 2021 and has maintained normal sinus rhythm in the interim until she was diagnosed with COVID earlier this year. She is still in atrial fibrillation today. As discussed at her last office visit, would recommend a DCCV for hopeful return to normal sinus rhythm. She is in agreement with this but wishes to wait until early July given family events over the next few weeks. Given her well-controlled heart rate in the 80's and history of nocturnal pauses, she has remained off AV nodal blocking agents. May need to consider Multaq in the future as discussed last month. - No reports of active bleeding. Continue Eliquis 5 mg twice daily for anticoagulation.  A repeat CBC and BMET will be obtained prior to her procedure.   Shared Decision Making/Informed Consent{ The risks (stroke, cardiac arrhythmias rarely resulting in the need for a temporary or permanent pacemaker, skin irritation or burns and complications associated with conscious sedation including aspiration, arrhythmia, respiratory failure and death), benefits (restoration of normal sinus rhythm) and alternatives of a direct current cardioversion were explained in detail to Ms. Carolyn Silva and she agrees to proceed.   2. OSA - Continued compliance with CPAP encouraged.   3. HTN - BP is well-controlled at 118/60 during today's visit. Continue Losartan 100mg  daily.   Signed, Carolyn Lennox, PA-C

## 2022-07-26 NOTE — Patient Instructions (Addendum)
  The doctor that follows your CPAP is Dr. Craige Cotta (Pulmonologist).  Medication Instructions:  Your physician recommends that you continue on your current medications as directed. Please refer to the Current Medication list given to you today.   *If you need a refill on your cardiac medications before your next appointment, please call your pharmacy*   Lab Work: Your physician recommends that you return for lab work when you come for your Pre-op appointment    If you have labs (blood work) drawn today and your tests are completely normal, you will receive your results only by: MyChart Message (if you have MyChart) OR A paper copy in the mail If you have any lab test that is abnormal or we need to change your treatment, we will call you to review the results.   Testing/Procedures: Your physician has recommended that you have a Cardioversion (DCCV). Electrical Cardioversion uses a jolt of electricity to your heart either through paddles or wired patches attached to your chest. This is a controlled, usually prescheduled, procedure. Defibrillation is done under light anesthesia in the hospital, and you usually go home the day of the procedure. This is done to get your heart back into a normal rhythm. You are not awake for the procedure. Please see the instruction sheet given to you today.    Follow-Up: At [facility], you and your health needs are our priority.  As part of our continuing mission to provide you with exceptional heart care, we have created designated Provider Care Teams.  These Care Teams include your primary Cardiologist (physician) and Advanced Practice Providers (APPs -  Physician Assistants and Nurse Practitioners) who all work together to provide you with the care you need, when you need it.  We recommend signing up for the patient portal called "MyChart".  Sign up information is provided on this After Visit Summary.  MyChart is used to connect with patients for  Virtual Visits (Telemedicine).  Patients are able to view lab/test results, encounter notes, upcoming appointments, etc.  Non-urgent messages can be sent to your provider as well.   To learn more about what you can do with MyChart, go to [email or web address].    Your next appointment:    4 weeks after your Cardioversion   Provider:   Nona Dell, MD or [name], PA-C    Other Instructions Thank you for choosing [facility]!

## 2022-07-27 ENCOUNTER — Telehealth: Payer: Self-pay | Admitting: *Deleted

## 2022-07-27 DIAGNOSIS — I4891 Unspecified atrial fibrillation: Secondary | ICD-10-CM | POA: Diagnosis not present

## 2022-07-27 DIAGNOSIS — R0902 Hypoxemia: Secondary | ICD-10-CM | POA: Diagnosis not present

## 2022-07-27 DIAGNOSIS — U099 Post covid-19 condition, unspecified: Secondary | ICD-10-CM | POA: Diagnosis not present

## 2022-07-27 DIAGNOSIS — E876 Hypokalemia: Secondary | ICD-10-CM | POA: Diagnosis not present

## 2022-07-27 DIAGNOSIS — Z7902 Long term (current) use of antithrombotics/antiplatelets: Secondary | ICD-10-CM | POA: Diagnosis not present

## 2022-07-27 DIAGNOSIS — J45909 Unspecified asthma, uncomplicated: Secondary | ICD-10-CM | POA: Diagnosis not present

## 2022-07-27 NOTE — Telephone Encounter (Signed)
Called to notify pt of upcoming DCCV on July 9 at 9:00 am and pre op on July 5 at 11:00 am. Pt notified and voiced understanding.

## 2022-08-17 ENCOUNTER — Other Ambulatory Visit: Payer: Self-pay

## 2022-08-17 ENCOUNTER — Emergency Department (HOSPITAL_COMMUNITY): Payer: 59

## 2022-08-17 ENCOUNTER — Inpatient Hospital Stay (HOSPITAL_COMMUNITY)
Admission: EM | Admit: 2022-08-17 | Discharge: 2022-08-21 | DRG: 202 | Disposition: A | Discharge status: Home / Self Care | Payer: 59 | Attending: Internal Medicine | Admitting: Internal Medicine

## 2022-08-17 ENCOUNTER — Telehealth: Payer: Self-pay | Admitting: Student

## 2022-08-17 ENCOUNTER — Encounter (HOSPITAL_COMMUNITY): Payer: Self-pay | Admitting: Emergency Medicine

## 2022-08-17 DIAGNOSIS — J449 Chronic obstructive pulmonary disease, unspecified: Secondary | ICD-10-CM | POA: Diagnosis not present

## 2022-08-17 DIAGNOSIS — J8283 Eosinophilic asthma: Secondary | ICD-10-CM | POA: Diagnosis present

## 2022-08-17 DIAGNOSIS — R918 Other nonspecific abnormal finding of lung field: Secondary | ICD-10-CM | POA: Diagnosis not present

## 2022-08-17 DIAGNOSIS — E876 Hypokalemia: Secondary | ICD-10-CM | POA: Diagnosis present

## 2022-08-17 DIAGNOSIS — K219 Gastro-esophageal reflux disease without esophagitis: Secondary | ICD-10-CM | POA: Diagnosis present

## 2022-08-17 DIAGNOSIS — Z79899 Other long term (current) drug therapy: Secondary | ICD-10-CM | POA: Diagnosis not present

## 2022-08-17 DIAGNOSIS — Z7951 Long term (current) use of inhaled steroids: Secondary | ICD-10-CM | POA: Diagnosis not present

## 2022-08-17 DIAGNOSIS — Z8616 Personal history of COVID-19: Secondary | ICD-10-CM

## 2022-08-17 DIAGNOSIS — I110 Hypertensive heart disease with heart failure: Secondary | ICD-10-CM | POA: Diagnosis not present

## 2022-08-17 DIAGNOSIS — G4733 Obstructive sleep apnea (adult) (pediatric): Secondary | ICD-10-CM

## 2022-08-17 DIAGNOSIS — D509 Iron deficiency anemia, unspecified: Secondary | ICD-10-CM | POA: Diagnosis not present

## 2022-08-17 DIAGNOSIS — Z91018 Allergy to other foods: Secondary | ICD-10-CM

## 2022-08-17 DIAGNOSIS — J440 Chronic obstructive pulmonary disease with acute lower respiratory infection: Secondary | ICD-10-CM | POA: Diagnosis not present

## 2022-08-17 DIAGNOSIS — I5032 Chronic diastolic (congestive) heart failure: Secondary | ICD-10-CM | POA: Diagnosis present

## 2022-08-17 DIAGNOSIS — J45901 Unspecified asthma with (acute) exacerbation: Principal | ICD-10-CM | POA: Diagnosis present

## 2022-08-17 DIAGNOSIS — R0902 Hypoxemia: Secondary | ICD-10-CM | POA: Diagnosis not present

## 2022-08-17 DIAGNOSIS — I4891 Unspecified atrial fibrillation: Secondary | ICD-10-CM

## 2022-08-17 DIAGNOSIS — J189 Pneumonia, unspecified organism: Secondary | ICD-10-CM | POA: Insufficient documentation

## 2022-08-17 DIAGNOSIS — Z6841 Body Mass Index (BMI) 40.0 and over, adult: Secondary | ICD-10-CM | POA: Diagnosis not present

## 2022-08-17 DIAGNOSIS — D649 Anemia, unspecified: Secondary | ICD-10-CM | POA: Diagnosis present

## 2022-08-17 DIAGNOSIS — Z7901 Long term (current) use of anticoagulants: Secondary | ICD-10-CM

## 2022-08-17 DIAGNOSIS — Y95 Nosocomial condition: Secondary | ICD-10-CM | POA: Diagnosis present

## 2022-08-17 DIAGNOSIS — R0609 Other forms of dyspnea: Secondary | ICD-10-CM | POA: Diagnosis not present

## 2022-08-17 DIAGNOSIS — D508 Other iron deficiency anemias: Secondary | ICD-10-CM | POA: Diagnosis not present

## 2022-08-17 DIAGNOSIS — I480 Paroxysmal atrial fibrillation: Secondary | ICD-10-CM | POA: Diagnosis not present

## 2022-08-17 DIAGNOSIS — I10 Essential (primary) hypertension: Secondary | ICD-10-CM | POA: Diagnosis present

## 2022-08-17 DIAGNOSIS — I4819 Other persistent atrial fibrillation: Secondary | ICD-10-CM | POA: Diagnosis present

## 2022-08-17 DIAGNOSIS — R000 Tachycardia, unspecified: Secondary | ICD-10-CM | POA: Diagnosis not present

## 2022-08-17 DIAGNOSIS — I700 Atherosclerosis of aorta: Secondary | ICD-10-CM | POA: Diagnosis not present

## 2022-08-17 DIAGNOSIS — E66813 Obesity, class 3: Secondary | ICD-10-CM

## 2022-08-17 DIAGNOSIS — R0602 Shortness of breath: Principal | ICD-10-CM

## 2022-08-17 LAB — BASIC METABOLIC PANEL
Anion gap: 8 (ref 5–15)
BUN: 8 mg/dL (ref 8–23)
CO2: 28 mmol/L (ref 22–32)
Calcium: 8.4 mg/dL — ABNORMAL LOW (ref 8.9–10.3)
Chloride: 105 mmol/L (ref 98–111)
Creatinine, Ser: 0.68 mg/dL (ref 0.44–1.00)
GFR, Estimated: >60 mL/min (ref >60)
Glucose, Bld: 89 mg/dL (ref 70–99)
Potassium: 3.1 mmol/L — ABNORMAL LOW (ref 3.5–5.1)
Sodium: 141 mmol/L (ref 135–145)

## 2022-08-17 LAB — BRAIN NATRIURETIC PEPTIDE: B Natriuretic Peptide: 257 pg/mL — ABNORMAL HIGH (ref 0.0–100.0)

## 2022-08-17 LAB — TROPONIN I (HIGH SENSITIVITY)
Troponin I (High Sensitivity): 7 ng/L (ref <18)
Troponin I (High Sensitivity): 8 ng/L (ref <18)

## 2022-08-17 LAB — CBC
HCT: 28.4 % — ABNORMAL LOW (ref 36.0–46.0)
Hemoglobin: 8.1 g/dL — ABNORMAL LOW (ref 12.0–15.0)
MCH: 21.6 pg — ABNORMAL LOW (ref 26.0–34.0)
MCHC: 28.5 g/dL — ABNORMAL LOW (ref 30.0–36.0)
MCV: 75.7 fL — ABNORMAL LOW (ref 80.0–100.0)
Platelets: 263 10*3/uL (ref 150–400)
RBC: 3.75 MIL/uL — ABNORMAL LOW (ref 3.87–5.11)
RDW: 17.9 % — ABNORMAL HIGH (ref 11.5–15.5)
WBC: 8.3 10*3/uL (ref 4.0–10.5)
nRBC: 0 % (ref 0.0–0.2)

## 2022-08-17 LAB — PROTIME-INR
INR: 1.3 — ABNORMAL HIGH (ref 0.8–1.2)
Prothrombin Time: 16 seconds — ABNORMAL HIGH (ref 11.4–15.2)

## 2022-08-17 MED ORDER — FUROSEMIDE 10 MG/ML IJ SOLN
40.0000 mg | Freq: Once | INTRAMUSCULAR | Status: AC
  Administered 2022-08-17: 40 mg via INTRAVENOUS
  Filled 2022-08-17: qty 4

## 2022-08-17 MED ORDER — ACETAMINOPHEN 650 MG RE SUPP: 650.0000 mg | Freq: Four times a day (QID) | RECTAL | Status: DC | PRN

## 2022-08-17 MED ORDER — LOSARTAN POTASSIUM 50 MG PO TABS
100.0000 mg | ORAL_TABLET | Freq: Every day | ORAL | Status: DC
  Administered 2022-08-18 – 2022-08-21 (×4): 100 mg via ORAL
  Filled 2022-08-17 (×4): qty 2

## 2022-08-17 MED ORDER — MOMETASONE FURO-FORMOTEROL FUM 100-5 MCG/ACT IN AERO: 2.0000 | INHALATION_SPRAY | Freq: Two times a day (BID) | RESPIRATORY_TRACT | Status: DC

## 2022-08-17 MED ORDER — IPRATROPIUM-ALBUTEROL 0.5-2.5 (3) MG/3ML IN SOLN: 3.0000 mL | Freq: Four times a day (QID) | RESPIRATORY_TRACT | Status: DC | PRN

## 2022-08-17 MED ORDER — HEPARIN SODIUM (PORCINE) 5000 UNIT/ML IJ SOLN: 5000.0000 [IU] | Freq: Three times a day (TID) | INTRAMUSCULAR | Status: DC

## 2022-08-17 MED ORDER — POTASSIUM CHLORIDE CRYS ER 20 MEQ PO TBCR
40.0000 meq | EXTENDED_RELEASE_TABLET | Freq: Once | ORAL | Status: AC
  Administered 2022-08-17: 40 meq via ORAL
  Filled 2022-08-17: qty 2

## 2022-08-17 MED ORDER — VANCOMYCIN HCL 1750 MG/350ML IV SOLN
1750.0000 mg | INTRAVENOUS | Status: DC
  Administered 2022-08-18: 1750 mg via INTRAVENOUS
  Filled 2022-08-17 (×2): qty 350

## 2022-08-17 MED ORDER — SODIUM CHLORIDE 0.9 % IV SOLN
2.0000 g | Freq: Three times a day (TID) | INTRAVENOUS | Status: DC
  Administered 2022-08-18 – 2022-08-19 (×5): 2 g via INTRAVENOUS
  Filled 2022-08-17 (×5): qty 12.5

## 2022-08-17 MED ORDER — IOHEXOL 350 MG/ML SOLN
75.0000 mL | Freq: Once | INTRAVENOUS | Status: AC | PRN
  Administered 2022-08-17: 75 mL via INTRAVENOUS

## 2022-08-17 MED ORDER — VANCOMYCIN HCL 2000 MG/400ML IV SOLN
2000.0000 mg | Freq: Once | INTRAVENOUS | Status: AC
  Administered 2022-08-17: 2000 mg via INTRAVENOUS
  Filled 2022-08-17: qty 400

## 2022-08-17 MED ORDER — OXYCODONE HCL 5 MG PO TABS: 5.0000 mg | ORAL_TABLET | ORAL | Status: DC | PRN

## 2022-08-17 MED ORDER — ACETAMINOPHEN 325 MG PO TABS: 650.0000 mg | ORAL_TABLET | Freq: Four times a day (QID) | ORAL | Status: DC | PRN

## 2022-08-17 MED ORDER — ONDANSETRON HCL 4 MG PO TABS: 4.0000 mg | ORAL_TABLET | Freq: Four times a day (QID) | ORAL | Status: DC | PRN

## 2022-08-17 MED ORDER — SODIUM CHLORIDE 0.9 % IV SOLN
2.0000 g | Freq: Once | INTRAVENOUS | Status: AC
  Administered 2022-08-17: 2 g via INTRAVENOUS
  Filled 2022-08-17: qty 12.5

## 2022-08-17 MED ORDER — APIXABAN 5 MG PO TABS
5.0000 mg | ORAL_TABLET | Freq: Two times a day (BID) | ORAL | Status: DC
  Administered 2022-08-17 – 2022-08-19 (×4): 5 mg via ORAL
  Filled 2022-08-17 (×4): qty 1

## 2022-08-17 MED ORDER — ONDANSETRON HCL 4 MG/2ML IJ SOLN: 4.0000 mg | Freq: Four times a day (QID) | INTRAMUSCULAR | Status: DC | PRN

## 2022-08-17 NOTE — Discharge Instructions (Signed)
You were seen in the emergency department for shortness of breath.  Your lab work showed your hemoglobin to be slightly down but there was no evidence of bleeding.  There was likely some extra fluid on your lungs and you were given a dose of diuretic.  Your potassium was also mildly low.  Please follow-up with your cardiology team as scheduled for cardioversion.  Return to the emergency department if any worsening or concerning symptoms.

## 2022-08-17 NOTE — Telephone Encounter (Signed)
   I was made aware by Dr. Wyline Mood that the patient is currently in the Emergency Department for evaluation of worsening dyspnea and found to have progressive anemia with hemoglobin of 8.1. He discussed with the ED provider and given that she is likely going to require further workup of her anemia, recommended holding off on upcoming DCCV given the likely need to hold anticoagulation. Rates have been well-controlled. Therefore, the DCCV for 08/24/2022 has been canceled and we will readdress candidacy for this at her follow-up visit in 09/2022.  Signed, Ellsworth Lennox, PA-C 08/17/2022, 4:21 PM

## 2022-08-17 NOTE — Progress Notes (Signed)
Pharmacy Antibiotic Note  Carolyn Silva is a 72 y.o. female admitted on 08/17/2022 with pneumonia.  Pharmacy has been consulted for Cefepime dosing.  Plan: Start Cefepime 2 gms IV q8hr Mon itor renal function, clinical status and C&S  Height: 5\' 5"  (165.1 cm) Weight: 113.4 kg (250 lb) IBW/kg (Calculated) : 57  Temp (24hrs), Avg:98.3 F (36.8 C), Min:98.2 F (36.8 C), Max:98.6 F (37 C)  Recent Labs  Lab 08/17/22 1204  WBC 8.3  CREATININE 0.68    Estimated Creatinine Clearance: 82.2 mL/min (by C-G formula based on SCr of 0.68 mg/dL).    Allergies  Allergen Reactions   Strawberry C [Ascorbic Acid] Hives    Thank you for allowing pharmacy to be a part of this patient's care.  Jeanella Cara, PharmD, [facility] Clinical Pharmacist Please see AMION for all Pharmacists' Contact Phone Numbers 08/17/2022, 8:45 PM

## 2022-08-17 NOTE — ED Triage Notes (Signed)
Pt via POV c/o SOB since last night. Pt went to the heart and vascular center but was told to come to ER due to elevated HR. HR 102 in triage and irregular. Pt has hx a fib. Pt denies CP, n/v/dizziness/diaphoresis.

## 2022-08-17 NOTE — ED Provider Notes (Addendum)
Patient struggled with walking.  Patient's hemoglobin is a little bit borderline at 8.1.  She mostly got tachycardic.  Off oxygen patient sats do drop but she does use oxygen at home when sleeping.  Patient placed back on 2 L of oxygen.  Will going do CT angio chest to rule out pulmonary embolus.  I know patient is on Eliquis so low yield.  Patient does state otherwise when she was not walking she does feel bit better.  Heart rate atrial fibs but heart rate is up in the 90s.  Patient's renal functions normal.  Disposition will be based on CT angio chest.   Vanetta Mulders, MD 08/17/22 1821  Patient off oxygen drops her sats down below 90.  Patient does have oxygen available at home for nighttime.  CT angio does not show pulmonary embolus but raises concerns for patchy airspace primarily right upper lobe consistent with pneumonia.  Patient was diagnosed with COVID back in May.  Part of the reason why went to rule out pulmonary embolus.  I will start patient on antibiotics and I think she needs admission for the pneumonia.  To make sure that she gets better.  She did have acute onset of feeling of shortness of breath is why she came in today.  In addition patient was admitted in May for exacerbation of her COPD.  Does not seem to be was going on today seems to be the pneumonia.  Theoretically this would be HCAP pneumonia.    Vanetta Mulders, MD 08/17/22 7829    Vanetta Mulders, MD 08/17/22 1950

## 2022-08-17 NOTE — Progress Notes (Signed)
Pharmacy Antibiotic Note  Carolyn Silva is a 72 y.o. female admitted on 08/17/2022 with pneumonia.  Pharmacy has been consulted for Vanc dosing.  Vancomycin 1750 mg IV Q 24 hrs. Goal AUC 400-550. Expected AUC: 498 SCr used: 0.68   Plan: Vanc 2 gm IV x 1, then 1750 mg IV q24hr Monitor renal function, C&S and vanc levels if needed   Height: 5\' 5"  (165.1 cm) Weight: 113.4 kg (250 lb) IBW/kg (Calculated) : 57  Temp (24hrs), Avg:98.3 F (36.8 C), Min:98.2 F (36.8 C), Max:98.6 F (37 C)  Recent Labs  Lab 08/17/22 1204  WBC 8.3  CREATININE 0.68    Estimated Creatinine Clearance: 82.2 mL/min (by C-G formula based on SCr of 0.68 mg/dL).    Allergies  Allergen Reactions   Strawberry C [Ascorbic Acid] Hives    Thank you for allowing pharmacy to be a part of this patient's care.  Jeanella Cara, PharmD, [facility] Clinical Pharmacist Please see AMION for all Pharmacists' Contact Phone Numbers 08/17/2022, 7:58 PM

## 2022-08-17 NOTE — ED Notes (Signed)
ED TO INPATIENT HANDOFF REPORT  ED Nurse Name and Phone #: Cat  S Name/Age/Gender Francisco Capuchin 72 y.o. female Room/Bed: APA10/APA10  Code Status   Code Status: Prior  Home/SNF/Other Home Patient oriented to: self, place, time, and situation Is this baseline? Yes   Triage Complete: Triage complete  Chief Complaint Pneumonia [J18.9]  Triage Note Pt via POV c/o SOB since last night. Pt went to the heart and vascular center but was told to come to ER due to elevated HR. HR 102 in triage and irregular. Pt has hx a fib. Pt denies CP, n/v/dizziness/diaphoresis.    Allergies Allergies  Allergen Reactions   Strawberry C [Ascorbic Acid] Hives    Level of Care/Admitting Diagnosis ED Disposition     ED Disposition  Admit   Condition  --   Comment  Hospital Area: [facility] [[id number]]  Level of Care: Telemetry [5]  Covid Evaluation: Recent COVID positive no isolation required infection day 21-90  Diagnosis: Pneumonia [227785]  Admitting Physician: Lilyan Gilford [[id number]]  Attending Physician: Lilyan Gilford [[id number]]  Certification:: I certify this patient will need inpatient services for at least 2 midnights  Estimated Length of Stay: 2          B Medical/Surgery History Past Medical History:  Diagnosis Date   Asthma    Hypertension    OSA (obstructive sleep apnea)    Paroxysmal atrial fibrillation (HCC)    Paroxysmal atrial flutter (HCC)    Past Surgical History:  Procedure Laterality Date   CARDIOVERSION N/A 07/11/2019   Procedure: CARDIOVERSION;  Surgeon: Jonelle Sidle, MD;  Location: [facility] ORS;  Service: Cardiovascular;  Laterality: N/A;   COLONOSCOPY N/A 09/14/2017   Procedure: COLONOSCOPY;  Surgeon: West Bali, MD;  Location: [facility] ENDO SUITE;  Service: Endoscopy;  Laterality: N/A;  2:00   TEE WITHOUT CARDIOVERSION N/A 07/11/2019   Procedure: TRANSESOPHAGEAL ECHOCARDIOGRAM (TEE) WITH PROPOFOL;  Surgeon: Jonelle Sidle, MD;   Location: [facility] ORS;  Service: Cardiovascular;  Laterality: N/A;   TUBAL LIGATION       A IV Location/Drains/Wounds Patient Lines/Drains/Airways Status     Active Line/Drains/Airways     Name Placement date Placement time Site Days   Peripheral IV 08/17/22 20 G Left Antecubital 08/17/22  1204  Antecubital  less than 1            Intake/Output Last 24 hours No intake or output data in the 24 hours ending 08/17/22 2031  Labs/Imaging Results for orders placed or performed during the hospital encounter of 08/17/22 (from the past 48 hour(s))  Basic metabolic panel     Status: Abnormal   Collection Time: 08/17/22 12:04 PM  Result Value Ref Range   Sodium 141 135 - 145 mmol/L   Potassium 3.1 (L) 3.5 - 5.1 mmol/L   Chloride 105 98 - 111 mmol/L   CO2 28 22 - 32 mmol/L   Glucose, Bld 89 70 - 99 mg/dL    Comment: Glucose reference range applies only to samples taken after fasting for at least 8 hours.   BUN 8 8 - 23 mg/dL   Creatinine, Ser 8.46 0.44 - 1.00 mg/dL   Calcium 8.4 (L) 8.9 - 10.3 mg/dL   GFR, Estimated >96 >29 mL/min    Comment: (NOTE) Calculated using the CKD-EPI Creatinine Equation (2021)    Anion gap 8 5 - 15    Comment: Performed at [facility], [address], [place], [place] [address]  CBC  Status: Abnormal   Collection Time: 08/17/22 12:04 PM  Result Value Ref Range   WBC 8.3 4.0 - 10.5 K/uL   RBC 3.75 (L) 3.87 - 5.11 MIL/uL   Hemoglobin 8.1 (L) 12.0 - 15.0 g/dL    Comment: Reticulocyte Hemoglobin testing may be clinically indicated, consider ordering this additional test [id number]    HCT 28.4 (L) 36.0 - 46.0 %   MCV 75.7 (L) 80.0 - 100.0 fL   MCH 21.6 (L) 26.0 - 34.0 pg   MCHC 28.5 (L) 30.0 - 36.0 g/dL   RDW 54.0 (H) 98.1 - 19.1 %   Platelets 263 150 - 400 K/uL   nRBC 0.0 0.0 - 0.2 %    Comment: Performed at [facility], [address] [place], [place] [address]  Protime-INR- (order if Patient is taking Coumadin / Warfarin)     Status: Abnormal    Collection Time: 08/17/22 12:04 PM  Result Value Ref Range   Prothrombin Time 16.0 (H) 11.4 - 15.2 seconds   INR 1.3 (H) 0.8 - 1.2    Comment: (NOTE) INR goal varies based on device and disease states. Performed at [facility], [address] [place], [place] [address]   Troponin I (High Sensitivity)     Status: None   Collection Time: 08/17/22 12:04 PM  Result Value Ref Range   Troponin I (High Sensitivity) 7 <18 ng/L    Comment: (NOTE) Elevated high sensitivity troponin I (hsTnI) values and significant  changes across serial measurements may suggest ACS but many other  chronic and acute conditions are known to elevate hsTnI results.  Refer to the "Links" section for chest pain algorithms and additional  guidance. Performed at [facility], [address], [place], [place] [address]   Brain natriuretic peptide     Status: Abnormal   Collection Time: 08/17/22 12:04 PM  Result Value Ref Range   B Natriuretic Peptide 257.0 (H) 0.0 - 100.0 pg/mL    Comment: Performed at [facility], [address], [place], [place] [address]  Troponin I (High Sensitivity)     Status: None   Collection Time: 08/17/22  2:34 PM  Result Value Ref Range   Troponin I (High Sensitivity) 8 <18 ng/L    Comment: (NOTE) Elevated high sensitivity troponin I (hsTnI) values and significant  changes across serial measurements may suggest ACS but many other  chronic and acute conditions are known to elevate hsTnI results.  Refer to the "Links" section for chest pain algorithms and additional  guidance. Performed at [facility], [address] [place], [place] [address]    CT Angio Chest PE W/Cm &/Or Wo Cm  Result Date: 08/17/2022 CLINICAL DATA:  Tachycardia and hypoxia, initial encounter EXAM: CT ANGIOGRAPHY CHEST WITH CONTRAST TECHNIQUE: Multidetector CT imaging of the chest was performed using the standard protocol during bolus administration of intravenous contrast. Multiplanar CT image reconstructions and  MIPs were obtained to evaluate the vascular anatomy. RADIATION DOSE REDUCTION: This exam was performed according to the departmental dose-optimization program which includes automated exposure control, adjustment of the mA and/or kV according to patient size and/or use of iterative reconstruction technique. CONTRAST:  75mL OMNIPAQUE IOHEXOL 350 MG/ML SOLN COMPARISON:  Chest x-ray from earlier in the same day. FINDINGS: Cardiovascular: Atherosclerotic calcifications of the thoracic aorta are noted. No aneurysmal dilatation or dissection is noted. The heart is enlarged in size. Pulmonary artery shows a normal branching pattern. No filling defect to suggest pulmonary embolism is noted. No significant coronary calcifications  are noted. Mediastinum/Nodes: Thoracic inlet is within normal limits. No hilar or mediastinal adenopathy is noted. The esophagus as visualized is within normal limits. Lungs/Pleura: Lungs are well aerated bilaterally. Patchy sub solid airspace opacities are noted in the right upper lobe consistent with early infiltrate. Similar findings are noted in the superior segment of the right lower lobe. Upper Abdomen: Visualized upper abdomen is unremarkable. Musculoskeletal: Degenerative changes of the thoracic spine are noted. No rib abnormality is noted. Review of the MIP images confirms the above findings. IMPRESSION: No evidence of pulmonary emboli. Patchy airspace opacities primarily in the right upper lobe consistent with pneumonia. As this was not well seen on prior chest x-ray, noncontrast CT is recommended in 3-4 weeks following trial of antibiotic therapy to ensure resolution and exclude underlying malignancy. Aortic Atherosclerosis (ICD10-I70.0). Electronically Signed   By: Alcide Clever M.D.   On: 08/17/2022 19:19   DG Chest Port 1 View  Result Date: 08/17/2022 CLINICAL DATA:  Atrial fibrillation EXAM: PORTABLE CHEST 1 VIEW COMPARISON:  Chest radiograph dated 06/22/2022 FINDINGS: Normal lung  volumes. No focal consolidations. No pleural effusion or pneumothorax. Similar enlarged cardiomediastinal silhouette. No acute osseous abnormality. IMPRESSION: 1.  No focal consolidations. 2. Similar cardiomegaly. Electronically Signed   By: Agustin Cree M.D.   On: 08/17/2022 11:39    Pending Labs Unresulted Labs (From admission, onward)     Start     Ordered   08/17/22 2022  MRSA Next Gen by PCR, Nasal  Once,   URGENT        08/17/22 2022            Vitals/Pain Today's Vitals   08/17/22 1700 08/17/22 1800 08/17/22 1830 08/17/22 1915  BP: (!) 171/71 (!) 148/88  138/78  Pulse: 90 84 90 93  Resp: (!) 32 (!) 28 (!) 24 (!) 21  Temp:    98.2 F (36.8 C)  TempSrc:    Oral  SpO2: 97% (!) 87% 95% 94%  Weight:      Height:      PainSc:        Isolation Precautions No active isolations  Medications Medications  ceFEPIme (MAXIPIME) 2 g in sodium chloride 0.9 % 100 mL IVPB (2 g Intravenous New Bag/Given 08/17/22 2027)  vancomycin (VANCOREADY) IVPB 2000 mg/400 mL (has no administration in time range)  vancomycin (VANCOREADY) IVPB 1750 mg/350 mL (has no administration in time range)  potassium chloride SA (KLOR-CON M) CR tablet 40 mEq (40 mEq Oral Given 08/17/22 1334)  furosemide (LASIX) injection 40 mg (40 mg Intravenous Given 08/17/22 1335)  iohexol (OMNIPAQUE) 350 MG/ML injection 75 mL (75 mLs Intravenous Contrast Given 08/17/22 1913)    Mobility walks     Focused Assessments Pulmonary Assessment Handoff:  Lung sounds:   O2 Device: Nasal Cannula O2 Flow Rate (L/min): 2 L/min    R Recommendations: See Admitting Provider Note  Report given to:   Additional Notes:

## 2022-08-17 NOTE — ED Provider Notes (Signed)
[place] EMERGENCY DEPARTMENT AT [facility] Provider Note   CSN: [id number] Arrival date & time: 08/17/22  1012     History  Chief Complaint  Patient presents with   Shortness of Breath    Carolyn Silva is a 72 y.o. female.  She was recently admitted in May for shortness of breath, found to have COVID.  She has been home for a month and a half.  Still in A-fib on Eliquis and anticipated for cardioversion next week.  She said since last night she has been more short of breath especially with any type of exertion.  Coughing up some green phlegm.  Denies any chest pain or fevers.  The history is provided by the patient.  Shortness of Breath Severity:  Moderate Onset quality:  Gradual Duration:  2 days Timing:  Constant Progression:  Worsening Chronicity:  Recurrent Relieved by:  Nothing Worsened by:  Activity and coughing Ineffective treatments:  Rest Associated symptoms: cough and sputum production   Associated symptoms: no abdominal pain, no chest pain, no fever and no hemoptysis        Home Medications Prior to Admission medications   Medication Sig Start Date End Date Taking? Authorizing Provider  albuterol (VENTOLIN HFA) 108 (90 Base) MCG/ACT inhaler Inhale 2 puffs into the lungs every 4 (four) hours as needed for wheezing or shortness of breath. 06/23/22   Johnson, Clanford L, MD  apixaban (ELIQUIS) 5 MG TABS tablet Take 1 tablet (5 mg total) by mouth 2 (two) times daily. PT NEEDS APPT WITH DR MCDOWELL FOR FUTURE REFILLS. 06/23/22   Johnson, Clanford L, MD  ipratropium-albuterol (DUONEB) 0.5-2.5 (3) MG/3ML SOLN Take 3 mLs by nebulization in the morning, at noon, and at bedtime. 06/23/22   Johnson, Clanford L, MD  losartan (COZAAR) 100 MG tablet Take 1 tablet (100 mg total) by mouth daily. 04/12/22 04/07/23  Furth, Cadence H, PA-C  mometasone-formoterol (DULERA) 100-5 MCG/ACT AERO Inhale 2 puffs into the lungs in the morning and at bedtime. 03/29/22   Rolly Salter, MD      Allergies    Strawberry c [ascorbic acid]    Review of Systems   Review of Systems  Constitutional:  Negative for fever.  Respiratory:  Positive for cough, sputum production and shortness of breath. Negative for hemoptysis.   Cardiovascular:  Negative for chest pain.  Gastrointestinal:  Negative for abdominal pain.    Physical Exam Updated Vital Signs BP (!) 129/105 (BP Location: Right Arm)   Pulse 91   Temp 98.6 F (37 C) (Oral)   Resp (!) 22   Ht 5\' 5"  (1.651 m)   Wt 113.4 kg   SpO2 96%   BMI 41.60 kg/m  Physical Exam Vitals and nursing note reviewed.  Constitutional:      General: She is not in acute distress.    Appearance: Normal appearance. She is well-developed. She is obese.  HENT:     Head: Normocephalic and atraumatic.  Eyes:     Conjunctiva/sclera: Conjunctivae normal.  Cardiovascular:     Rate and Rhythm: Tachycardia present. Rhythm irregular.     Heart sounds: No murmur heard. Pulmonary:     Effort: Tachypnea and accessory muscle usage present. No respiratory distress.     Breath sounds: Normal breath sounds.  Abdominal:     Palpations: Abdomen is soft.     Tenderness: There is no abdominal tenderness.  Musculoskeletal:        General: No swelling.  Cervical back: Neck supple.     Right lower leg: No tenderness.     Left lower leg: No tenderness.  Skin:    General: Skin is warm and dry.     Capillary Refill: Capillary refill takes less than 2 seconds.  Neurological:     General: No focal deficit present.     Mental Status: She is alert.     Sensory: No sensory deficit.     Motor: No weakness.     ED Results / Procedures / Treatments   Labs (all labs ordered are listed, but only abnormal results are displayed) Labs Reviewed  BASIC METABOLIC PANEL - Abnormal; Notable for the following components:      Result Value   Potassium 3.1 (*)    Calcium 8.4 (*)    All other components within normal limits  CBC - Abnormal; Notable  for the following components:   RBC 3.75 (*)    Hemoglobin 8.1 (*)    HCT 28.4 (*)    MCV 75.7 (*)    MCH 21.6 (*)    MCHC 28.5 (*)    RDW 17.9 (*)    All other components within normal limits  PROTIME-INR - Abnormal; Notable for the following components:   Prothrombin Time 16.0 (*)    INR 1.3 (*)    All other components within normal limits  BRAIN NATRIURETIC PEPTIDE - Abnormal; Notable for the following components:   B Natriuretic Peptide 257.0 (*)    All other components within normal limits  TROPONIN I (HIGH SENSITIVITY)  TROPONIN I (HIGH SENSITIVITY)    EKG EKG Interpretation Date/Time:  Wednesday August 17 2022 11:28:06 EDT Ventricular Rate:  89 PR Interval:    QRS Duration:  101 QT Interval:  393 QTC Calculation: 484 R Axis:   26  Text Interpretation: Atrial fibrillation Ventricular premature complex Low voltage, precordial leads Minimal ST depression, anterolateral leads No significant change since prior 5/24 Confirmed by Meridee Score [phone]) on 08/17/2022 11:37:55 AM  Radiology DG Chest Port 1 View  Result Date: 08/17/2022 CLINICAL DATA:  Atrial fibrillation EXAM: PORTABLE CHEST 1 VIEW COMPARISON:  Chest radiograph dated 06/22/2022 FINDINGS: Normal lung volumes. No focal consolidations. No pleural effusion or pneumothorax. Similar enlarged cardiomediastinal silhouette. No acute osseous abnormality. IMPRESSION: 1.  No focal consolidations. 2. Similar cardiomegaly. Electronically Signed   By: Agustin Cree M.D.   On: 08/17/2022 11:39    Procedures Procedures    Medications Ordered in ED Medications  potassium chloride SA (KLOR-CON M) CR tablet 40 mEq (40 mEq Oral Given 08/17/22 1334)  furosemide (LASIX) injection 40 mg (40 mg Intravenous Given 08/17/22 1335)    ED Course/ Medical Decision Making/ A&P Clinical Course as of 08/17/22 1700  Wed Aug 17, 2022  1309 Discussed with cardiology Dr. Wyline Mood.  He did not feel cardioversion was indicated at this time.  He does agree  with working up the hemoglobin drop.  If no acute findings can consider dose of Lasix here and if feeling better can follow-up outpatient for her continued cardiac workup. [MB]  1329 Rectal exam done patient was heme-negative.  I misunderstood patient as far as the blood thinner, she said she has been on it chronically.  She denies any melena or other signs of bleeding. [MB]  1337 She also does send she uses oxygen at night.  Not usually during the day. [MB]    Clinical Course User Index [MB] Terrilee Files, MD  Medical Decision Making Amount and/or Complexity of Data Reviewed Labs: ordered. Radiology: ordered.  Risk Prescription drug management.   This patient complains of shortness of breath dyspnea on exertion; this involves an extensive number of treatment Options and is a complaint that carries with it a high risk of complications and morbidity. The differential includes pneumonia, heart failure, A-fib with RVR, metabolic derangement, dehydration, anemia, PE  I ordered, reviewed and interpreted labs, which included CBC normal white count lower than baseline hemoglobin, chemistries with low potassium normal renal function, troponins flat, BNP elevated I ordered medication oral potassium IV Lasix and reviewed PMP when indicated. I ordered imaging studies which included chest x-ray and I independently    visualized and interpreted imaging which showed cardiomegaly no gross infiltrate Additional history obtained from patient's family member Previous records obtained and reviewed in epic including recent cardiology notes I consulted Dr. Wyline Mood cardiology and discussed lab and imaging findings and discussed disposition.  Cardiac monitoring reviewed, A-fib with controlled rate Social determinants considered, no significant barriers Critical Interventions: None  After the interventions stated above, I reevaluated the patient and found patient to be resting  comfortably in no distress Admission and further testing considered,  trying diuresis and will give ambulation trial.  Care signed out to Dr. Deretha Emory with plan to follow-up on response to diuresis.  If not back to baseline may need further discussion with cardiology and admission for diuresis.         Final Clinical Impression(s) / ED Diagnoses Final diagnoses:  Shortness of breath  Atrial fibrillation with rapid ventricular response (HCC)  Anemia, unspecified type    Rx / DC Orders ED Discharge Orders     None         Terrilee Files, MD 08/17/22 [id number]

## 2022-08-18 DIAGNOSIS — J189 Pneumonia, unspecified organism: Secondary | ICD-10-CM

## 2022-08-18 DIAGNOSIS — E876 Hypokalemia: Secondary | ICD-10-CM | POA: Diagnosis not present

## 2022-08-18 DIAGNOSIS — I480 Paroxysmal atrial fibrillation: Secondary | ICD-10-CM

## 2022-08-18 DIAGNOSIS — I10 Essential (primary) hypertension: Secondary | ICD-10-CM | POA: Diagnosis not present

## 2022-08-18 DIAGNOSIS — J45901 Unspecified asthma with (acute) exacerbation: Secondary | ICD-10-CM | POA: Diagnosis not present

## 2022-08-18 DIAGNOSIS — G4733 Obstructive sleep apnea (adult) (pediatric): Secondary | ICD-10-CM

## 2022-08-18 LAB — COMPREHENSIVE METABOLIC PANEL
ALT: 18 U/L (ref 0–44)
AST: 16 U/L (ref 15–41)
Albumin: 3.2 g/dL — ABNORMAL LOW (ref 3.5–5.0)
Alkaline Phosphatase: 82 U/L (ref 38–126)
Anion gap: 8 (ref 5–15)
BUN: 7 mg/dL — ABNORMAL LOW (ref 8–23)
CO2: 29 mmol/L (ref 22–32)
Calcium: 8.2 mg/dL — ABNORMAL LOW (ref 8.9–10.3)
Chloride: 105 mmol/L (ref 98–111)
Creatinine, Ser: 0.68 mg/dL (ref 0.44–1.00)
GFR, Estimated: >60 mL/min (ref >60)
Glucose, Bld: 95 mg/dL (ref 70–99)
Potassium: 3.3 mmol/L — ABNORMAL LOW (ref 3.5–5.1)
Sodium: 142 mmol/L (ref 135–145)
Total Bilirubin: 0.4 mg/dL (ref 0.3–1.2)
Total Protein: 6.5 g/dL (ref 6.5–8.1)

## 2022-08-18 LAB — HEMOGLOBIN AND HEMATOCRIT, BLOOD
HCT: 27.6 % — ABNORMAL LOW (ref 36.0–46.0)
Hemoglobin: 7.8 g/dL — ABNORMAL LOW (ref 12.0–15.0)

## 2022-08-18 LAB — CBC WITH DIFFERENTIAL/PLATELET
Abs Immature Granulocytes: 0.04 10*3/uL (ref 0.00–0.07)
Basophils Absolute: 0 10*3/uL (ref 0.0–0.1)
Basophils Relative: 0 %
Eosinophils Absolute: 0.3 10*3/uL (ref 0.0–0.5)
Eosinophils Relative: 3 %
HCT: 27 % — ABNORMAL LOW (ref 36.0–46.0)
Hemoglobin: 7.5 g/dL — ABNORMAL LOW (ref 12.0–15.0)
Immature Granulocytes: 0 %
Lymphocytes Relative: 13 %
Lymphs Abs: 1.2 10*3/uL (ref 0.7–4.0)
MCH: 21.4 pg — ABNORMAL LOW (ref 26.0–34.0)
MCHC: 27.8 g/dL — ABNORMAL LOW (ref 30.0–36.0)
MCV: 76.9 fL — ABNORMAL LOW (ref 80.0–100.0)
Monocytes Absolute: 0.6 10*3/uL (ref 0.1–1.0)
Monocytes Relative: 7 %
Neutro Abs: 6.9 10*3/uL (ref 1.7–7.7)
Neutrophils Relative %: 77 %
Platelets: 293 10*3/uL (ref 150–400)
RBC: 3.51 MIL/uL — ABNORMAL LOW (ref 3.87–5.11)
RDW: 17.8 % — ABNORMAL HIGH (ref 11.5–15.5)
WBC: 9 10*3/uL (ref 4.0–10.5)
nRBC: 0 % (ref 0.0–0.2)

## 2022-08-18 LAB — MAGNESIUM: Magnesium: 2.1 mg/dL (ref 1.7–2.4)

## 2022-08-18 LAB — MRSA NEXT GEN BY PCR, NASAL: MRSA by PCR Next Gen: NOT DETECTED

## 2022-08-18 MED ORDER — IPRATROPIUM-ALBUTEROL 0.5-2.5 (3) MG/3ML IN SOLN: 3.0000 mL | Freq: Four times a day (QID) | RESPIRATORY_TRACT | Status: DC

## 2022-08-18 MED ORDER — POTASSIUM CHLORIDE CRYS ER 20 MEQ PO TBCR
20.0000 meq | EXTENDED_RELEASE_TABLET | Freq: Once | ORAL | Status: AC
  Administered 2022-08-18: 20 meq via ORAL
  Filled 2022-08-18: qty 1

## 2022-08-18 MED ORDER — MOMETASONE FURO-FORMOTEROL FUM 100-5 MCG/ACT IN AERO
2.0000 | INHALATION_SPRAY | Freq: Two times a day (BID) | RESPIRATORY_TRACT | Status: DC
  Administered 2022-08-18 – 2022-08-21 (×7): 2 via RESPIRATORY_TRACT
  Filled 2022-08-18: qty 8.8

## 2022-08-18 MED ORDER — IPRATROPIUM-ALBUTEROL 0.5-2.5 (3) MG/3ML IN SOLN
3.0000 mL | Freq: Four times a day (QID) | RESPIRATORY_TRACT | Status: DC
  Administered 2022-08-19 (×3): 3 mL via RESPIRATORY_TRACT
  Filled 2022-08-18 (×3): qty 3

## 2022-08-18 MED ORDER — IPRATROPIUM-ALBUTEROL 0.5-2.5 (3) MG/3ML IN SOLN
3.0000 mL | Freq: Four times a day (QID) | RESPIRATORY_TRACT | Status: DC
  Administered 2022-08-18 (×3): 3 mL via RESPIRATORY_TRACT
  Filled 2022-08-18 (×3): qty 3

## 2022-08-18 MED ORDER — POTASSIUM CHLORIDE CRYS ER 20 MEQ PO TBCR
40.0000 meq | EXTENDED_RELEASE_TABLET | Freq: Once | ORAL | Status: AC
  Administered 2022-08-18: 40 meq via ORAL
  Filled 2022-08-18: qty 2

## 2022-08-18 MED ORDER — METHYLPREDNISOLONE SODIUM SUCC 125 MG IJ SOLR
125.0000 mg | Freq: Every day | INTRAMUSCULAR | Status: DC
  Administered 2022-08-18 – 2022-08-19 (×2): 125 mg via INTRAVENOUS
  Filled 2022-08-18 (×2): qty 2

## 2022-08-18 MED ORDER — ALBUTEROL SULFATE (2.5 MG/3ML) 0.083% IN NEBU: 2.5000 mg | INHALATION_SOLUTION | RESPIRATORY_TRACT | Status: DC | PRN

## 2022-08-18 NOTE — Assessment & Plan Note (Addendum)
Patient in persistent atrial fibrillation.   Had signs of volume overload on admission. Last echocardiogram from 2021 with preserved LV systolic function,   Will check echocardiogram this admission. Continue anticoagulation with apixaban.  Gastroenterology looking to pursued endoscopic and colonoscopy as an outpatient; patient clear to resume the use of Eliquis and pursue cardioversion as previously instructed.

## 2022-08-18 NOTE — TOC CM/SW Note (Signed)
Transition of Care [facility]) - Inpatient Brief Assessment   Patient Details  Name: Carolyn Silva MRN: [id number] Date of Birth: 03-06-1950  Transition of [facility]) CM/SW Contact:    Elliot Gault, LCSW Phone Number: 08/18/2022, 11:18 AM   Clinical Narrative:    Transition of Care Asessment: Insurance and Status: Insurance coverage has been reviewed Patient has primary care physician: Yes Home environment has been reviewed: from home with family Prior level of function:: independent Prior/Current Home Services: No current home services Social Determinants of Health Reivew: SDOH reviewed no interventions necessary Readmission risk has been reviewed: Yes Transition of care needs: no transition of care needs at this time

## 2022-08-18 NOTE — Assessment & Plan Note (Addendum)
Renal function stable with serum cr at 0,71, K is 3,9 and serum bicarbonate at 26. Na 139  Plan to continue close follow up renal function and electrolytes.  Patient advised to maintain adequate hydration and to follow heart healthy diet.

## 2022-08-18 NOTE — Assessment & Plan Note (Addendum)
Continue blood pressure control with losartan.

## 2022-08-18 NOTE — Assessment & Plan Note (Deleted)
-   CTA shows no PE but does show pneumonia - Patient was recently hospitalized for COVID, so increased risk for MRSA pneumonia - Covering for HCAP with cefepime and vancomycin - Sputum culture pending - Urine Legionella and strep antigens pending - No leukocytosis - Patient did have tachycardia but that is likely related to her A-fib, and tachypnea, did not meet sepsis criteria - Patient is on her baseline oxygen requirement at this time

## 2022-08-18 NOTE — Progress Notes (Signed)
Pt is A&O x 4 and is resting in bed and is talking well. A MRSA screen was obtained. Urine sample and occult stool sample to be collected once pt has a BM and voids. Pt had a 10 beat run of Vtach and is asymptomatic. MD notified and ordered kdur. No c/o of dizziness, lightheadedness, palpitations or SOB. Will continue to monitor.

## 2022-08-18 NOTE — Hospital Course (Addendum)
Carolyn Silva was admitted to the hospital with the working diagnosis of pneumonia.   72 yo female with the past medical history of asthma, hypertension, atrial fibrillation, OSA and obesity who presented with dyspnea and cough. Reported worsening symptoms for the last 2 days. Intermittent palpitations, positive PND and peripheral edema. In the ED her blood pressure was 189/89, RR 92, rr 21 and 02 saturation 94%,  Lungs with wheezing bilaterally with no rhonchi or rales, heart with S1 and S2 present and irregularly irregular, abdomen soft and non distended, positive bilateral lower extremity edema.   Na 142, K 3,3 CL 105 bicarbonate 29, glucose 95 bun 7 cr 0,68  Wbc 9.0 hgb 7,5 plt 293   Chest radiograph with cardiomegaly, with no effusions or infiltrates. Prominent vasculature on the right hilar region.   CT chest with no pulmonary embolism. Patchy air space disease in the right upper lobe consistent with pneumonia. Centrally located infiltrate.   EKG 89 bpm, normal axis, normal intervals, atrial fibrillation rhythm with no significant ST segment or T wave changes.   Patient had furosemide in the ED with improvement in her symptoms.  She has been placed on antibiotic therapy.

## 2022-08-18 NOTE — H&P (Signed)
History and Physical    Patient: Carolyn Silva WUJ:[id number] DOB: 05-26-1950 DOA: 08/17/2022 DOS: the patient was seen and examined on 08/18/2022 PCP: Benita Stabile, MD  Patient coming from: Home  Chief Complaint:  Chief Complaint  Patient presents with   Shortness of Breath   HPI: [name] is a 72 y.o. female with medical history significant of asthma, hypertension, obstructive sleep apnea, atrial fibrillation, and more presents the ED with a chief complaint of dyspnea.  Patient reports that her symptoms worsened at night before last.  They have been worse since they started.  Patient reports cough that is productive of regular sputum.  Patient denies any fever.  She denies chest pain, but does sometimes have palpitations.  Her palpitations at her dyspnea are worse with exertion.  She has a history of A-fib and she is scheduled for cardioversion on July 10.  Patient reports that she has been wheezing since being in the hospital.  She thinks that her breathing is worse than it was at home because of the wheezing.  She does have increased work of breathing and is speaking in 3-4 word phrases, but her oxygen sats are maintaining on 2 L nasal cannula.  Patient reports she has had some peripheral edema.  She is not sure if she has orthopnea or not, but she thinks that she does.  She reports a good response to Lasix given in the ED.  Patient has no other complaints at this time.  Patient does not smoke, does not drink.  She got vaccinated for COVID and she had a hospitalization for COVID in May.  Patient reports she would want to be full code. Review of Systems: As mentioned in the history of present illness. All other systems reviewed and are negative. Past Medical History:  Diagnosis Date   Asthma    Hypertension    OSA (obstructive sleep apnea)    Paroxysmal atrial fibrillation (HCC)    Paroxysmal atrial flutter [facility])    Past Surgical History:  Procedure Laterality Date    CARDIOVERSION N/A 07/11/2019   Procedure: CARDIOVERSION;  Surgeon: Jonelle Sidle, MD;  Location: [facility] ORS;  Service: Cardiovascular;  Laterality: N/A;   COLONOSCOPY N/A 09/14/2017   Procedure: COLONOSCOPY;  Surgeon: West Bali, MD;  Location: [facility] ENDO SUITE;  Service: Endoscopy;  Laterality: N/A;  2:00   TEE WITHOUT CARDIOVERSION N/A 07/11/2019   Procedure: TRANSESOPHAGEAL ECHOCARDIOGRAM (TEE) WITH PROPOFOL;  Surgeon: Jonelle Sidle, MD;  Location: [facility] ORS;  Service: Cardiovascular;  Laterality: N/A;   TUBAL LIGATION     Social History:  reports that she has never smoked. She has never used smokeless tobacco. She reports that she does not currently use alcohol. She reports that she does not use drugs.  Allergies  Allergen Reactions   Strawberry C [Ascorbic Acid] Hives    Family History  Problem Relation Age of Onset   Diabetes Mother    Cirrhosis Father    Diabetes Brother    Kidney disease Sister    Colon cancer Neg Hx    Colon polyps Neg Hx     Prior to Admission medications   Medication Sig Start Date End Date Taking? Authorizing Provider  albuterol (VENTOLIN HFA) 108 (90 Base) MCG/ACT inhaler Inhale 2 puffs into the lungs every 4 (four) hours as needed for wheezing or shortness of breath. 06/23/22   Johnson, Clanford L, MD  apixaban (ELIQUIS) 5 MG TABS tablet Take 1 tablet (5 mg total) by  mouth 2 (two) times daily. PT NEEDS APPT WITH DR MCDOWELL FOR FUTURE REFILLS. 06/23/22   Johnson, Clanford L, MD  ipratropium-albuterol (DUONEB) 0.5-2.5 (3) MG/3ML SOLN Take 3 mLs by nebulization in the morning, at noon, and at bedtime. 06/23/22   Johnson, Clanford L, MD  losartan (COZAAR) 100 MG tablet Take 1 tablet (100 mg total) by mouth daily. 04/12/22 04/07/23  Furth, Cadence H, PA-C  mometasone-formoterol (DULERA) 100-5 MCG/ACT AERO Inhale 2 puffs into the lungs in the morning and at bedtime. 03/29/22   Rolly Salter, MD    Physical Exam: Vitals:   08/17/22 1830 08/17/22 1915 08/17/22  2134 08/17/22 2340  BP:  138/78 (!) 189/89 (!) 150/62  Pulse: 90 93 92 60  Resp: (!) 24 (!) 21 20 20   Temp:  98.2 F (36.8 C) 98.5 F (36.9 C) 98.4 F (36.9 C)  TempSrc:  Oral  Oral  SpO2: 95% 94% 97% 96%  Weight:   119.3 kg   Height:       1.  General: Patient lying supine in bed,  no acute distress   2. Psychiatric: Alert and oriented x 3, mood and behavior normal for situation, pleasant and cooperative with exam   3. Neurologic: Speech and language are normal, face is symmetric, moves all 4 extremities voluntarily, at baseline without acute deficits on limited exam   4. HEENMT:  Head is atraumatic, normocephalic, pupils reactive to light, neck is supple, trachea is midline, mucous membranes are moist   5. Respiratory : Wheezing present bilaterally, no rhonchi, rales, no cyanosis, patient does have an increased work of breathing and is speaking in 3-4 word sentences on her baseline 2 L nasal cannula   6. Cardiovascular : Heart rate normal, rhythm is regular, no murmurs, rubs or gallops, peripheral edema bilaterally but greater on right than left, peripheral pulses palpated   7. Gastrointestinal:  Abdomen is soft, nondistended, nontender to palpation bowel sounds active, no masses or organomegaly palpated   8. Skin:  Skin is warm, dry and intact without rashes, acute lesions, or ulcers on limited exam   9.Musculoskeletal:  No acute deformities or trauma, no asymmetry in tone, peripheral edema bilaterally but greater on right than left, peripheral pulses palpated, no tenderness to palpation in the extremities  Data Reviewed: In the ED Temp 98.2-98.6, heart rate 84-93, respiratory rate 21-32, blood pressure 129/65-171/105, satting 87% on 2 L nasal cannula No leukocytosis with a white blood cell count of 8.3, hemoglobin 8.1 Chemistry is unremarkable aside from a hypokalemia BNP 257 Trope 7, 8 CTA shows no PE but does show pneumonia EKG shows atrial fibs with a heart rate  of 89, QTc 484 Patient was given cefepime and vancomycin Patient was also given Lasix and 40 mEq of potassium Admission requested for pneumonia  Assessment and Plan: * Pneumonia - CTA shows no PE but does show pneumonia - Patient was recently hospitalized for COVID, so increased risk for MRSA pneumonia - Covering for HCAP with cefepime and vancomycin - Sputum culture pending - Urine Legionella and strep antigens pending - No leukocytosis - Patient did have tachycardia but that is likely related to her A-fib, and tachypnea, did not meet sepsis criteria - Patient is on her baseline oxygen requirement at this time  OSA (obstructive sleep apnea) Continue CPAP at night  Hypokalemia - 40 mEq of potassium given in the ED - Recheck in the a.m. - Holding home Lasix, although was given IV Lasix in the ED  Essential  hypertension - Continue losartan  Paroxysmal atrial fibrillation (HCC) - Currently in A-fib - Rate controlled - On Eliquis, continue - Plan for cardioversion on July 10  Asthma exacerbation - Patient is wheezing on exam - She was requiring 3 L nasal cannula at presentation - Patient reports that after treatment so far she has had no improvement - DuoNeb and albuterol to her treatment regimen - Add steroid - Likely triggered by pneumonia, continue treatment as per pneumonia assessment - CTA shows no PE, but does show pneumonia - Continue to monitor      Advance Care Planning:   Code Status: Full Code  Consults: None at this time  Family Communication: No family at bedside  Severity of Illness: The appropriate patient status for this patient is INPATIENT. Inpatient status is judged to be reasonable and necessary in order to provide the required intensity of service to ensure the patient's safety. The patient's presenting symptoms, physical exam findings, and initial radiographic and laboratory data in the context of their chronic comorbidities is felt to place them  at high risk for further clinical deterioration. Furthermore, it is not anticipated that the patient will be medically stable for discharge from the hospital within 2 midnights of admission.   * I certify that at the point of admission it is my clinical judgment that the patient will require inpatient hospital care spanning beyond 2 midnights from the point of admission due to high intensity of service, high risk for further deterioration and high frequency of surveillance required.*  Author: Lilyan Gilford, DO 08/18/2022 4:54 AM  For on call review [email or web address].

## 2022-08-18 NOTE — Progress Notes (Signed)
Tele just notified this nurse that Pt had a 10 beat run of Vtach. Pt is asymptomatic and denies any SOB, palpitations and dizziness. MD notified.

## 2022-08-18 NOTE — Assessment & Plan Note (Deleted)
-   Patient is wheezing on exam - She was requiring 3 L nasal cannula at presentation - Patient reports that after treatment so far she has had no improvement - DuoNeb and albuterol to her treatment regimen - Add steroid - Likely triggered by pneumonia, continue treatment as per pneumonia assessment - CTA shows no PE, but does show pneumonia - Continue to monitor

## 2022-08-18 NOTE — Progress Notes (Signed)
Notified Dr. Jonathon Bellows of BP. No new orders. Pt is asymptomatic.     08/18/22 1417  Vitals  BP (!) 163/99  MAP (mmHg) 117  BP Location Right Arm  BP Method Automatic  Patient Position (if appropriate) Sitting  MEWS COLOR  MEWS Score Color Green  MEWS Score  MEWS Temp 0  MEWS Systolic 0  MEWS Pulse 0  MEWS RR 1  MEWS LOC 0  MEWS Score 1

## 2022-08-18 NOTE — Progress Notes (Signed)
Patient seen and examined personally, I reviewed the chart, history and physical and admission note, done by admitting physician this morning and agree with the same with following addendum.  Please refer to the morning admission note for more detailed plan of care.  Briefly,  72 y.o.f with history asthma,hypertension,OSA,atrial fibrillation, and more presents the ED with a chief complaint of dyspnea and also cough -productive w/o any fever/chest pain, but does sometimes have palpitations and her palpitations at her dyspnea are worse with exertion Was complaining of wheezing in the ED and felt better after getting Lasix In the ED vitals are stable heart rate 84-93 saturating 87% on 2 L nasal cannula labs with no leukocytosis hemoglobin 8.1 g BNP 257 troponin 7, 8 CTA showed: no PE but does show pneumonia EKG shows atrial fibs with a heart rate of 89, QTc 484 Patient was given cefepime and vancomycin, potassium Lasix and admission requested for further management   SEEN THIS AM Son at bedside Feels much better after nebulizer on RA  Lung clear, diminished at bases No edema She urinated a lot she says  A/P Pneumonia: CTA reviewed recent hospitalization for COVID, increased risk of MRSA infection, continue cefepime/vancomycin follow-up sputum culture urine antigen..  Acute asthma exacerbation: Needing nasal cannula oxygen and wheezing continue bronchodilators, Dulera,iv steroid supplemental oxygen  Hypokalemia-repleting  Microcytic anemia:hemoglobin 7.5 g this morning previously ranging in 8 to 9 g. check anemia panel, FOBT, of note she is on Eliquis watch for any further drop. Last colo ~ 4 yrs ago and was "normal" no black stool.  Other chronic issues as below see HPI PAF OSA Hypertension

## 2022-08-18 NOTE — Assessment & Plan Note (Addendum)
Continue CPAP at night Weight loss management discussed with patient.

## 2022-08-19 ENCOUNTER — Encounter (HOSPITAL_COMMUNITY): Admission: RE | Admit: 2022-08-19 | Payer: 59 | Source: Ambulatory Visit

## 2022-08-19 DIAGNOSIS — I10 Essential (primary) hypertension: Secondary | ICD-10-CM | POA: Diagnosis not present

## 2022-08-19 DIAGNOSIS — D649 Anemia, unspecified: Secondary | ICD-10-CM | POA: Diagnosis not present

## 2022-08-19 DIAGNOSIS — J189 Pneumonia, unspecified organism: Secondary | ICD-10-CM | POA: Diagnosis not present

## 2022-08-19 DIAGNOSIS — E876 Hypokalemia: Secondary | ICD-10-CM | POA: Diagnosis not present

## 2022-08-19 DIAGNOSIS — I480 Paroxysmal atrial fibrillation: Secondary | ICD-10-CM | POA: Diagnosis not present

## 2022-08-19 DIAGNOSIS — D509 Iron deficiency anemia, unspecified: Secondary | ICD-10-CM

## 2022-08-19 LAB — FOLATE: Folate: 8.9 ng/mL (ref >5.9)

## 2022-08-19 LAB — BASIC METABOLIC PANEL
Anion gap: 7 (ref 5–15)
BUN: 14 mg/dL (ref 8–23)
CO2: 26 mmol/L (ref 22–32)
Calcium: 8.5 mg/dL — ABNORMAL LOW (ref 8.9–10.3)
Chloride: 106 mmol/L (ref 98–111)
Creatinine, Ser: 0.71 mg/dL (ref 0.44–1.00)
GFR, Estimated: >60 mL/min (ref >60)
Glucose, Bld: 117 mg/dL — ABNORMAL HIGH (ref 70–99)
Potassium: 3.9 mmol/L (ref 3.5–5.1)
Sodium: 139 mmol/L (ref 135–145)

## 2022-08-19 LAB — RETICULOCYTES
Immature Retic Fract: 34.8 % — ABNORMAL HIGH (ref 2.3–15.9)
RBC.: 3.41 MIL/uL — ABNORMAL LOW (ref 3.87–5.11)
Retic Count, Absolute: 54.6 10*3/uL (ref 19.0–186.0)
Retic Ct Pct: 1.6 % (ref 0.4–3.1)

## 2022-08-19 LAB — CBC
HCT: 26.1 % — ABNORMAL LOW (ref 36.0–46.0)
Hemoglobin: 7.3 g/dL — ABNORMAL LOW (ref 12.0–15.0)
MCH: 21.5 pg — ABNORMAL LOW (ref 26.0–34.0)
MCHC: 28 g/dL — ABNORMAL LOW (ref 30.0–36.0)
MCV: 76.8 fL — ABNORMAL LOW (ref 80.0–100.0)
Platelets: 267 10*3/uL (ref 150–400)
RBC: 3.4 MIL/uL — ABNORMAL LOW (ref 3.87–5.11)
RDW: 18 % — ABNORMAL HIGH (ref 11.5–15.5)
WBC: 10.1 10*3/uL (ref 4.0–10.5)
nRBC: 0 % (ref 0.0–0.2)

## 2022-08-19 LAB — TYPE AND SCREEN: ABO/RH(D): O POS

## 2022-08-19 LAB — IRON AND TIBC
Iron: 13 ug/dL — ABNORMAL LOW (ref 28–170)
Saturation Ratios: 3 % — ABNORMAL LOW (ref 10.4–31.8)
TIBC: 435 ug/dL (ref 250–450)
UIBC: 422 ug/dL

## 2022-08-19 LAB — FERRITIN: Ferritin: 7 ng/mL — ABNORMAL LOW (ref 11–307)

## 2022-08-19 LAB — VITAMIN B12: Vitamin B-12: 391 pg/mL (ref 180–914)

## 2022-08-19 LAB — BPAM RBC
Blood Product Expiration Date: 202408102359
Unit Type and Rh: 5100

## 2022-08-19 LAB — PREPARE RBC (CROSSMATCH)

## 2022-08-19 MED ORDER — CEFDINIR 300 MG PO CAPS
300.0000 mg | ORAL_CAPSULE | Freq: Two times a day (BID) | ORAL | Status: DC
  Administered 2022-08-19 – 2022-08-21 (×4): 300 mg via ORAL
  Filled 2022-08-19 (×4): qty 1

## 2022-08-19 MED ORDER — IPRATROPIUM-ALBUTEROL 0.5-2.5 (3) MG/3ML IN SOLN
3.0000 mL | Freq: Three times a day (TID) | RESPIRATORY_TRACT | Status: DC
  Administered 2022-08-20 – 2022-08-21 (×4): 3 mL via RESPIRATORY_TRACT
  Filled 2022-08-19 (×5): qty 3

## 2022-08-19 MED ORDER — SODIUM CHLORIDE 0.9 % IV SOLN
510.0000 mg | Freq: Once | INTRAVENOUS | Status: AC
  Administered 2022-08-20: 510 mg via INTRAVENOUS
  Filled 2022-08-19: qty 510

## 2022-08-19 MED ORDER — PANTOPRAZOLE SODIUM 40 MG PO TBEC
40.0000 mg | DELAYED_RELEASE_TABLET | Freq: Every day | ORAL | Status: DC
  Administered 2022-08-19 – 2022-08-21 (×3): 40 mg via ORAL
  Filled 2022-08-19 (×3): qty 1

## 2022-08-19 MED ORDER — DIPHENHYDRAMINE-ZINC ACETATE 2-0.1 % EX CREA
TOPICAL_CREAM | Freq: Two times a day (BID) | CUTANEOUS | Status: DC | PRN
  Filled 2022-08-19: qty 28

## 2022-08-19 MED ORDER — DIPHENHYDRAMINE HCL 25 MG PO CAPS
25.0000 mg | ORAL_CAPSULE | Freq: Once | ORAL | Status: AC
  Administered 2022-08-19: 25 mg via ORAL
  Filled 2022-08-19: qty 1

## 2022-08-19 MED ORDER — HYDRALAZINE HCL 20 MG/ML IJ SOLN: 10.0000 mg | Freq: Four times a day (QID) | INTRAMUSCULAR | Status: DC | PRN

## 2022-08-19 MED ORDER — SODIUM CHLORIDE 0.9% IV SOLUTION: Freq: Once | INTRAVENOUS | Status: DC

## 2022-08-19 NOTE — Progress Notes (Addendum)
Progress Note   Patient: Carolyn Silva ZOX:[id number] DOB: 05/12/1950 DOA: 08/17/2022     2 DOS: the patient was seen and examined on 08/19/2022   Brief hospital course: Carolyn Silva was admitted to the hospital with the working diagnosis of pneumonia.   72 yo female with the past medical history of asthma, hypertension, atrial fibrillation, OSA and obesity who presented with dyspnea and cough. Reported worsening symptoms for the last 2 days. Intermittent palpitations, positive PND and peripheral edema. In the ED her blood pressure was 189/89, RR 92, rr 21 and 02 saturation 94%,  Lungs with wheezing bilaterally with no rhonchi or rales, heart with S1 and S2 present and irregularly irregular, abdomen soft and non distended, positive bilateral lower extremity edema.   Na 142, K 3,3 CL 105 bicarbonate 29, glucose 95 bun 7 cr 0,68  Wbc 9.0 hgb 7,5 plt 293   Chest radiograph with cardiomegaly, with no effusions or infiltrates. Prominent vasculature on the right hilar region.   CT chest with no pulmonary embolism. Patchy air space disease in the right upper lobe consistent with pneumonia. Centrally located infiltrate.   EKG 89 bpm, normal axis, normal intervals, atrial fibrillation rhythm with no significant ST segment or T wave changes.   Patient had furosemide in the ED with improvement in her symptoms.  She has been placed on antibiotic therapy.     Assessment and Plan: * Community acquired pneumonia Asthma exacerbation. Symptoms have improved.  02 saturation is 96% on room air,  Patient has been afebrile and no leukocytosis.   Plan to continue bronchodilator therapy. Stop systemic corticosteroids. Continue with inhaled corticosteroids.  Change antibiotic therapy to cefdinir to complete 5 days total.   Paroxysmal atrial fibrillation (HCC) Patient in persistent atrial fibrillation.   Had signs of volume overload on admission. Last echocardiogram from 2021 with preserved LV  systolic function,   Will check echocardiogram this admission. Continue anticoagulation with apixaban. Considering severe iron deficiency anemia  consult Gi for occult GI bleed work up.  Cardioversion has been postponed until anemia work up completed.    Essential hypertension Continue blood pressure control with losartan.   Hypokalemia Renal function stable with serum cr at 0,71, K is 3,9 and serum bicarbonate at 26. Na 139  Plan to continue close follow up renal function and electrolytes.  Holding on further loop diuretic therapy for now.   OSA (obstructive sleep apnea) Continue CPAP at night  Iron deficiency anemia Severe symptomatic iron deficiency anemia.  Iron 13, TIBC 435, transferrin saturation 3 and ferritin 7. Plan to get hgb at 8 or above, transfusing one unit PRBC Will add IV iron tomorrow. Consult GI for workup for occult GI bleed.  Follow up Hgb post PRBC transfusion.   Class 3 obesity (HCC) Calculated BMI is 43.7         Subjective: Patient with improvement in dyspnea, no further wheezing, no palpitations.   Physical Exam: Vitals:   08/19/22 0401 08/19/22 0916 08/19/22 1312 08/19/22 1409  BP: 122/81  (!) 169/104   Pulse: 82  90   Resp: 20  19   Temp: 98.9 F (37.2 C)  97.9 F (36.6 C)   TempSrc:      SpO2: 100% 95% 96% (!) 9%  Weight:      Height:       Neurology awake and alert ENT with positive pallor Cardiovascular with S1 and S2 present, irregularly irregular with no gallops, rubs or murmurs No JVD Trace lower extremity  edema Respiratory with no rales, rhonchi or wheezing Abdomen with no distention  Data Reviewed:    Family Communication: no family at the bedside   Disposition: Status is: Inpatient Remains inpatient appropriate because: PRBC transfusion   Planned Discharge Destination: Home    Author: Coralie Keens, MD 08/19/2022 4:44 PM  For on call review [email or web address].

## 2022-08-19 NOTE — Assessment & Plan Note (Addendum)
Asthma exacerbation; improved/stabilized after receiving IV steroids, nebulizer management and bronchodilator treatments. Symptoms have improved resolved at baseline.Marland Kitchen  02 saturation is 96% on room air,   Patient has been afebrile and no leukocytosis appreciated.  Repeat chest x-ray in 6-8 weeks to follow resolution of lung infiltrates.

## 2022-08-19 NOTE — Care Management Important Message (Signed)
Important Message  Patient Details  Name: Carolyn Silva MRN: [id number] Date of Birth: 1950/09/14   Medicare Important Message Given:  Yes     Corey Harold 08/19/2022, 1:37 PM

## 2022-08-19 NOTE — Progress Notes (Signed)
Respiratory Therapy called for suggestions on how to adjust patient's CPAP mask.

## 2022-08-19 NOTE — Assessment & Plan Note (Signed)
Severe symptomatic iron deficiency anemia.  Iron 13, TIBC 435, transferrin saturation 3 and ferritin 7. Plan to get hgb at 8 or above, transfusing one unit PRBC Will add IV iron tomorrow. Consult GI for workup for occult GI bleed.  Follow up Hgb post PRBC transfusion.

## 2022-08-19 NOTE — Consult Note (Signed)
Gastroenterology Consult   Referring Provider: No ref. provider found Primary Care Physician:  Benita Stabile, MD Primary Gastroenterologist: unassigned (previously Dr. Darrick Penna)  Patient ID: Carolyn Silva; [id number]; 1950/11/17   Admit date: 08/17/2022  LOS: 2 days   Date of Consultation: 08/19/2022  Reason for Consultation:  symptomatic anemia, concern for occult GI bleed.   History of Present Illness   Carolyn Silva is a 72 y.o. year old female with history of asthma, HTN, OSA, Afib on eliquis who presented to the ED with dyspnea and cough as well as palpitations. Recent hospitalization in May for COVID.  Has been following with cardiology for her Afib and was scheduled to undergo cardioversion outpatient on 7/10 which appears to have been cancelled.   Labs 08/19/22: Hgb 7.3, Iron 13, saturation 3%, ferritin 7. B12 and folate normal.   Patient denies any melena, brbpr, abdominal pain, N/V, chest pain, changes in bowel movements. She denies any frequent NSAID use. She has had some dyspnea on exertion for a while since her COVID in May but has also been undergoing evaluation of her afib with cardiology with plan for outpatient cardioversion. She would like to do procedures outpatient if able to avoid spending all weekend in the hospital   Colonoscopy 09/14/2017: - Four 2 to 4 mm polyps in the rectum, at the hepatic flexure and in the ascending colon  - Three 5 to 15 mm polyps in the transverse colon and in the ascending colon.  - 5 mm polyp in the ascending colon.  - MODERATE Diverticulosis in the LEFT colon.  - Internal hemorrhoids. - Path: tubular adenomas - Advised repeat in 3 years.  No prior EGD on file.    Past Medical History:  Diagnosis Date   Asthma    Hypertension    OSA (obstructive sleep apnea)    Paroxysmal atrial fibrillation (HCC)    Paroxysmal atrial flutter [facility])     Past Surgical History:  Procedure Laterality Date   CARDIOVERSION N/A 07/11/2019    Procedure: CARDIOVERSION;  Surgeon: Jonelle Sidle, MD;  Location: [facility] ORS;  Service: Cardiovascular;  Laterality: N/A;   COLONOSCOPY N/A 09/14/2017   Procedure: COLONOSCOPY;  Surgeon: West Bali, MD;  Location: [facility] SUITE;  Service: Endoscopy;  Laterality: N/A;  2:00   TEE WITHOUT CARDIOVERSION N/A 07/11/2019   Procedure: TRANSESOPHAGEAL ECHOCARDIOGRAM (TEE) WITH PROPOFOL;  Surgeon: Jonelle Sidle, MD;  Location: [facility];  Service: Cardiovascular;  Laterality: N/A;   TUBAL LIGATION      Prior to Admission medications   Medication Sig Start Date End Date Taking? Authorizing Provider  albuterol (VENTOLIN HFA) 108 (90 Base) MCG/ACT inhaler Inhale 2 puffs into the lungs every 4 (four) hours as needed for wheezing or shortness of breath. 06/23/22  Yes Johnson, Clanford L, MD  apixaban (ELIQUIS) 5 MG TABS tablet Take 1 tablet (5 mg total) by mouth 2 (two) times daily. PT NEEDS APPT WITH DR MCDOWELL FOR FUTURE REFILLS. 06/23/22  Yes Johnson, Clanford L, MD  ipratropium-albuterol (DUONEB) 0.5-2.5 (3) MG/3ML SOLN Take 3 mLs by nebulization in the morning, at noon, and at bedtime. 06/23/22  Yes Johnson, Clanford L, MD  losartan (COZAAR) 100 MG tablet Take 1 tablet (100 mg total) by mouth daily. 04/12/22 04/07/23 Yes Furth, Cadence H, PA-C  mometasone-formoterol (DULERA) 100-5 MCG/ACT AERO Inhale 2 puffs into the lungs in the morning and at bedtime. 03/29/22  Yes Rolly Salter, MD    Current Facility-Administered Medications  Medication Dose Route Frequency Provider Last Rate Last Admin   0.9 %  sodium chloride infusion (Manually program via Guardrails IV Fluids)   Intravenous Once Arrien, York Ram, MD       acetaminophen (TYLENOL) tablet 650 mg  650 mg Oral Q6H PRN Zierle-Ghosh, Asia B, DO       Or   acetaminophen (TYLENOL) suppository 650 mg  650 mg Rectal Q6H PRN Zierle-Ghosh, Asia B, DO       albuterol (PROVENTIL) (2.5 MG/3ML) 0.083% nebulizer solution 2.5 mg  2.5 mg Nebulization Q4H PRN  Zierle-Ghosh, Asia B, DO       apixaban (ELIQUIS) tablet 5 mg  5 mg Oral BID Zierle-Ghosh, Asia B, DO   5 mg at 08/19/22 0909   ceFEPIme (MAXIPIME) 2 g in sodium chloride 0.9 % 100 mL IVPB  2 g Intravenous Q8H Zierle-Ghosh, Asia B, DO 200 mL/hr at 08/19/22 1159 2 g at 08/19/22 1159   ipratropium-albuterol (DUONEB) 0.5-2.5 (3) MG/3ML nebulizer solution 3 mL  3 mL Nebulization Q6H WA Kc, Ramesh, MD   3 mL at 08/19/22 1409   losartan (COZAAR) tablet 100 mg  100 mg Oral Daily Zierle-Ghosh, Asia B, DO   100 mg at 08/19/22 0909   methylPREDNISolone sodium succinate (SOLU-MEDROL) 125 mg/2 mL injection 125 mg  125 mg Intravenous Daily Zierle-Ghosh, Asia B, DO   125 mg at 08/19/22 0913   mometasone-formoterol (DULERA) 100-5 MCG/ACT inhaler 2 puff  2 puff Inhalation BID Zierle-Ghosh, Asia B, DO   2 puff at 08/19/22 0916   ondansetron (ZOFRAN) tablet 4 mg  4 mg Oral Q6H PRN Zierle-Ghosh, Asia B, DO       Or   ondansetron (ZOFRAN) injection 4 mg  4 mg Intravenous Q6H PRN Zierle-Ghosh, Asia B, DO       oxyCODONE (Oxy IR/ROXICODONE) immediate release tablet 5 mg  5 mg Oral Q4H PRN Zierle-Ghosh, Asia B, DO       vancomycin (VANCOREADY) IVPB 1750 mg/350 mL  1,750 mg Intravenous Q24H Zierle-Ghosh, Asia B, DO 175 mL/hr at 08/18/22 2035 1,750 mg at 08/18/22 2035    Allergies as of 08/17/2022 - Review Complete 08/17/2022  Allergen Reaction Noted   Strawberry c [ascorbic acid] Hives 01/01/2011    Family History  Problem Relation Age of Onset   Diabetes Mother    Cirrhosis Father    Diabetes Brother    Kidney disease Sister    Colon cancer Neg Hx    Colon polyps Neg Hx     Social History   Socioeconomic History   Marital status: Single    Spouse name: Not on file   Number of children: Not on file   Years of education: Not on file   Highest education level: Not on file  Occupational History   Not on file  Tobacco Use   Smoking status: Never   Smokeless tobacco: Never  Vaping Use   Vaping Use:  Never used  Substance and Sexual Activity   Alcohol use: Not Currently    Comment: Occasional   Drug use: No   Sexual activity: Not on file  Other Topics Concern   Not on file  Social History Narrative   Not on file   Social Determinants of Health   Financial Resource Strain: Not on file  Food Insecurity: No Food Insecurity (08/17/2022)   Hunger Vital Sign    Worried About Running Out of Food in the Last Year: Never true    Ran Out of  Food in the Last Year: Never true  Transportation Needs: No Transportation Needs (08/17/2022)   PRAPARE - Administrator, Civil Service (Medical): No    Lack of Transportation (Non-Medical): No  Physical Activity: Not on file  Stress: Not on file  Social Connections: Not on file  Intimate Partner Violence: Not At Risk (08/17/2022)   Humiliation, Afraid, Rape, and Kick questionnaire    Fear of Current or Ex-Partner: No    Emotionally Abused: No    Physically Abused: No    Sexually Abused: No     Review of Systems   Gen: Denies any fever, chills, loss of appetite, change in weight or weight loss CV: Denies chest pain, heart palpitations, syncope, edema  Resp: + dyspnea on exertion, + cough. Denies wheezing, coughing up blood, and pleurisy. GI: see HPI GU : Denies urinary burning, blood in urine, urinary frequency, and urinary incontinence. MS: Denies joint pain, limitation of movement, swelling, cramps, and atrophy.  Derm: Denies rash, itching, dry skin, hives. Psych: Denies depression, anxiety, memory loss, hallucinations, and confusion. Heme: Denies bruising or bleeding Neuro:  Denies any headaches, dizziness, paresthesias, shaking  Physical Exam   Vital Signs in last 24 hours: Temp:  [97.9 F (36.6 C)-98.9 F (37.2 C)] 97.9 F (36.6 C) (07/05 1312) Pulse Rate:  [82-102] 90 (07/05 1312) Resp:  [18-20] 19 (07/05 1312) BP: (122-169)/(81-104) 169/104 (07/05 1312) SpO2:  [9 %-100 %] 9 % (07/05 1409) Last BM Date :  08/18/22  General:   Alert,  Well-developed, well-nourished, pleasant and cooperative in NAD Head:  Normocephalic and atraumatic. Eyes:  Sclera clear, no icterus.   Conjunctiva pink. Ears:  Normal auditory acuity. Lungs:  Clear throughout to auscultation.   No wheezes, crackles, or rhonchi. No acute distress. Heart:  Regular rate and rhythm; no murmurs, clicks, rubs,  or gallops. Abdomen:  Soft, nontender and nondistended. No masses, hepatosplenomegaly or hernias noted. Normal bowel sounds, without guarding, and without rebound.   Rectal: deferred   Extremities:  Without clubbing or edema. Neurologic:  Alert and  oriented x4. Skin:  Intact without significant lesions or rashes. Psych:  Alert and cooperative. Normal mood and affect.  Intake/Output from previous day: 07/04 0701 - 07/05 0700 In: 600 [P.O.:600] Out: 200 [Urine:200] Intake/Output this shift: No intake/output data recorded.  Labs/Studies   Recent Labs Recent Labs    08/17/22 1204 08/18/22 0439 08/18/22 2003 08/19/22 0426  WBC 8.3 9.0  --  10.1  HGB 8.1* 7.5* 7.8* 7.3*  HCT 28.4* 27.0* 27.6* 26.1*  PLT 263 293  --  267   BMET Recent Labs    08/17/22 1204 08/18/22 0439 08/19/22 0426  NA 141 142 139  K 3.1* 3.3* 3.9  CL 105 105 106  CO2 28 29 26   GLUCOSE 89 95 117*  BUN 8 7* 14  CREATININE 0.68 0.68 0.71  CALCIUM 8.4* 8.2* 8.5*   LFT Recent Labs    08/18/22 0439  PROT 6.5  ALBUMIN 3.2*  AST 16  ALT 18  ALKPHOS 82  BILITOT 0.4   PT/INR Recent Labs    08/17/22 1204  LABPROT 16.0*  INR 1.3*   Hepatitis Panel No results for input(s): "HEPBSAG", "HCVAB", "HEPAIGM", "HEPBIGM" in the last 72 hours. C-Diff No results for input(s): "CDIFFTOX" in the last 72 hours.  Radiology/Studies CT Angio Chest PE W/Cm &/Or Wo Cm  Result Date: 08/17/2022 CLINICAL DATA:  Tachycardia and hypoxia, initial encounter EXAM: CT ANGIOGRAPHY CHEST WITH CONTRAST TECHNIQUE:  Multidetector CT imaging of the chest was  performed using the standard protocol during bolus administration of intravenous contrast. Multiplanar CT image reconstructions and MIPs were obtained to evaluate the vascular anatomy. RADIATION DOSE REDUCTION: This exam was performed according to the departmental dose-optimization program which includes automated exposure control, adjustment of the mA and/or kV according to patient size and/or use of iterative reconstruction technique. CONTRAST:  75mL OMNIPAQUE IOHEXOL 350 MG/ML SOLN COMPARISON:  Chest x-ray from earlier in the same day. FINDINGS: Cardiovascular: Atherosclerotic calcifications of the thoracic aorta are noted. No aneurysmal dilatation or dissection is noted. The heart is enlarged in size. Pulmonary artery shows a normal branching pattern. No filling defect to suggest pulmonary embolism is noted. No significant coronary calcifications are noted. Mediastinum/Nodes: Thoracic inlet is within normal limits. No hilar or mediastinal adenopathy is noted. The esophagus as visualized is within normal limits. Lungs/Pleura: Lungs are well aerated bilaterally. Patchy sub solid airspace opacities are noted in the right upper lobe consistent with early infiltrate. Similar findings are noted in the superior segment of the right lower lobe. Upper Abdomen: Visualized upper abdomen is unremarkable. Musculoskeletal: Degenerative changes of the thoracic spine are noted. No rib abnormality is noted. Review of the MIP images confirms the above findings. IMPRESSION: No evidence of pulmonary emboli. Patchy airspace opacities primarily in the right upper lobe consistent with pneumonia. As this was not well seen on prior chest x-ray, noncontrast CT is recommended in 3-4 weeks following trial of antibiotic therapy to ensure resolution and exclude underlying malignancy. Aortic Atherosclerosis (ICD10-I70.0). Electronically Signed   By: Alcide Clever M.D.   On: 08/17/2022 19:19     Assessment   Carolyn Silva is a 72 y.o.  year old female asthma, HTN, OSA, Afib on eliquis who presented to the ED with dyspnea and cough as well as palpitations. Given ongoing drop in hemoglobin GI consulted for further evaluation of anemia.   Microcytic anemia: Hgb 7.3 with evidence of iron deficiency and ongoing dyspnea on exertion. Agree with 1 unit of transfusion and iron infusion. Last colonoscopy in 2019 with multiple tubular adenomas and recommended repeat TCS in 3 years. Currently overdue. Would advise colonoscopy with possible EGD for further evaluation but will need to wait over 48 hours to allow for Eliquis washout. Would also like cardiology to weigh in on ability to perform procedure to ensure no concerns for arrhythmia with procedure. Patient would like to avoid inpatient procedure if possible. Will reassess in the AM after transfusion to determine if procedures should be done inpatient or if they can be done early interval outpatient if no overt GI bleeding noted. Did advise that she would get the soonest ability for procedures if done inpatient.   Plan / Recommendations   Agree with 1u PRBC and iron infusion Hold Eliquis Trend H/H, transfuse for hgb <7 PPI daily FOBT ordered but not yet collected Possible colonoscopy/EGD after Eliquis washout.  To be determined if inpatient or outpatient Cardiac clearance requested - communicated to hospitalist.     08/19/2022, 4:18 PM  Brooke Bonito, MSN, FNP-BC, AGACNP-BC [facility]

## 2022-08-19 NOTE — Progress Notes (Signed)
Patient slept some during the night. Up independently to the restroom. No complaints of pain at this time. Patient slept with CPAP on most of the night. Plan of care ongoing.

## 2022-08-19 NOTE — Assessment & Plan Note (Addendum)
-  Calculated BMI is 43.7  -Calorie diet, portion control and increase physical activity discussed with patient.

## 2022-08-20 ENCOUNTER — Other Ambulatory Visit (HOSPITAL_COMMUNITY): Payer: Self-pay | Admitting: *Deleted

## 2022-08-20 ENCOUNTER — Inpatient Hospital Stay (HOSPITAL_COMMUNITY): Payer: 59

## 2022-08-20 DIAGNOSIS — R0609 Other forms of dyspnea: Secondary | ICD-10-CM | POA: Diagnosis not present

## 2022-08-20 DIAGNOSIS — I10 Essential (primary) hypertension: Secondary | ICD-10-CM | POA: Diagnosis not present

## 2022-08-20 DIAGNOSIS — G4733 Obstructive sleep apnea (adult) (pediatric): Secondary | ICD-10-CM | POA: Diagnosis not present

## 2022-08-20 DIAGNOSIS — E876 Hypokalemia: Secondary | ICD-10-CM | POA: Diagnosis not present

## 2022-08-20 DIAGNOSIS — D508 Other iron deficiency anemias: Secondary | ICD-10-CM | POA: Diagnosis not present

## 2022-08-20 LAB — ECHOCARDIOGRAM COMPLETE
Area-P 1/2: 5.13 cm2
Height: 65 in
S' Lateral: 3.2 cm
Weight: 4208.14 oz

## 2022-08-20 LAB — MAGNESIUM: Magnesium: 2.2 mg/dL (ref 1.7–2.4)

## 2022-08-20 LAB — BASIC METABOLIC PANEL
Anion gap: 9 (ref 5–15)
BUN: 15 mg/dL (ref 8–23)
CO2: 26 mmol/L (ref 22–32)
Calcium: 8.8 mg/dL — ABNORMAL LOW (ref 8.9–10.3)
Chloride: 104 mmol/L (ref 98–111)
Creatinine, Ser: 0.74 mg/dL (ref 0.44–1.00)
GFR, Estimated: >60 mL/min (ref >60)
Glucose, Bld: 109 mg/dL — ABNORMAL HIGH (ref 70–99)
Potassium: 3.6 mmol/L (ref 3.5–5.1)
Sodium: 139 mmol/L (ref 135–145)

## 2022-08-20 LAB — CBC
HCT: 30.3 % — ABNORMAL LOW (ref 36.0–46.0)
Hemoglobin: 8.7 g/dL — ABNORMAL LOW (ref 12.0–15.0)
MCH: 22.1 pg — ABNORMAL LOW (ref 26.0–34.0)
MCHC: 28.7 g/dL — ABNORMAL LOW (ref 30.0–36.0)
MCV: 77.1 fL — ABNORMAL LOW (ref 80.0–100.0)
Platelets: 289 10*3/uL (ref 150–400)
RBC: 3.93 MIL/uL (ref 3.87–5.11)
RDW: 18.1 % — ABNORMAL HIGH (ref 11.5–15.5)
WBC: 11.1 10*3/uL — ABNORMAL HIGH (ref 4.0–10.5)
nRBC: 0 % (ref 0.0–0.2)

## 2022-08-20 LAB — TYPE AND SCREEN
Antibody Screen: NEGATIVE
Unit division: 0

## 2022-08-20 LAB — BPAM RBC: ISSUE DATE / TIME: 202407051821

## 2022-08-20 MED ORDER — DIPHENHYDRAMINE HCL 25 MG PO CAPS
25.0000 mg | ORAL_CAPSULE | Freq: Once | ORAL | Status: AC
  Administered 2022-08-20: 25 mg via ORAL
  Filled 2022-08-20: qty 1

## 2022-08-20 NOTE — Progress Notes (Signed)
Progress Note   Patient: Carolyn Silva ZOX:[id number] DOB: 11/10/1950 DOA: 08/17/2022     3 DOS: the patient was seen and examined on 08/20/2022   Brief hospital course: Carolyn Silva was admitted to the hospital with the working diagnosis of pneumonia.   72 yo female with the past medical history of asthma, hypertension, atrial fibrillation, OSA and obesity who presented with dyspnea and cough. Reported worsening symptoms for the last 2 days. Intermittent palpitations, positive PND and peripheral edema. In the ED her blood pressure was 189/89, RR 92, rr 21 and 02 saturation 94%,  Lungs with wheezing bilaterally with no rhonchi or rales, heart with S1 and S2 present and irregularly irregular, abdomen soft and non distended, positive bilateral lower extremity edema.   Na 142, K 3,3 CL 105 bicarbonate 29, glucose 95 bun 7 cr 0,68  Wbc 9.0 hgb 7,5 plt 293   Chest radiograph with cardiomegaly, with no effusions or infiltrates. Prominent vasculature on the right hilar region.   CT chest with no pulmonary embolism. Patchy air space disease in the right upper lobe consistent with pneumonia. Centrally located infiltrate.   EKG 89 bpm, normal axis, normal intervals, atrial fibrillation rhythm with no significant ST segment or T wave changes.   Patient had furosemide in the ED with improvement in her symptoms.  She has been placed on antibiotic therapy.     Assessment and Plan: * Community acquired pneumonia Asthma exacerbation. Symptoms have improved.  02 saturation is 96% on room air,  Patient has been afebrile and no leukocytosis.   Plan to continue bronchodilator therapy. Stop systemic corticosteroids. Continue with inhaled corticosteroids.  Change antibiotic therapy to cefdinir to complete 5 days total.   Paroxysmal atrial fibrillation (HCC) Patient in persistent atrial fibrillation.   Had signs of volume overload on admission. Last echocardiogram from 2021 with preserved LV  systolic function,   Will check echocardiogram this admission. Continue anticoagulation with apixaban. Considering severe iron deficiency anemia  consult Gi for occult GI bleed work up.  Cardioversion has been postponed until anemia work up completed.    Essential hypertension Continue blood pressure control with losartan.   Hypokalemia Renal function stable with serum cr at 0,71, K is 3,9 and serum bicarbonate at 26. Na 139  Plan to continue close follow up renal function and electrolytes.  Holding on further loop diuretic therapy for now.   OSA (obstructive sleep apnea) Continue CPAP at night  Iron deficiency anemia Severe symptomatic iron deficiency anemia.  Iron 13, TIBC 435, transferrin saturation 3 and ferritin 7. Plan to get hgb at 8 or above, 8.7 after blood transfusion. Continue to follow hemoglobin trend. Consult GI for workup for occult GI bleed.  Follow up Hgb post PRBC transfusion.   Class 3 obesity (HCC) Calculated BMI is 43.7    Subjective: Still experiencing intermittent coughing spells; no chest pain, no nausea, no vomiting, no fever.  Physical Exam: Vitals:   08/20/22 0740 08/20/22 0937 08/20/22 1310 08/20/22 1456  BP:  (!) 148/69 (!) 145/67   Pulse:  95 84   Resp:  20    Temp:  98 F (36.7 C) 98.1 F (36.7 C)   TempSrc:  Oral Oral   SpO2: 97% 97% 99% 93%  Weight:      Height:       General exam: Alert, awake, oriented x 3; no chest pain, no nausea, no vomiting. Respiratory system: Clear to auscultation. Respiratory effort normal.  Good saturation on room air. Cardiovascular  system:RRR. No rubs or gallops; no JVD. Gastrointestinal system: Abdomen is obese, nondistended, soft and nontender. No organomegaly or masses felt. Normal bowel sounds heard. Central nervous system: Alert and oriented. No focal neurological deficits. Extremities: No cyanosis or clubbing; no edema Skin: No petechiae. Psychiatry: Judgement and insight appear normal. Mood &  affect appropriate.   Data Reviewed: CBC: White blood cells 11.1, hemoglobin 8.7 and platelet count 29 K Basic metabolic panel: Sodium 139, potassium 3.6, chloride 104, bicarb 26, BUN 15, creatinine 0.74 and GFR >60   Family Communication: no family at the bedside   Disposition: Status is: Inpatient Remains inpatient appropriate because: PRBC transfusion   Planned Discharge Destination: Home  Author: Vassie Loll, MD 08/20/2022 7:24 PM  For on call review [email or web address].

## 2022-08-20 NOTE — Progress Notes (Signed)
*  PRELIMINARY RESULTS* Echocardiogram 2D Echocardiogram has been performed.  Mathhew Buysse J 08/20/2022, 1:11 PM 

## 2022-08-20 NOTE — Progress Notes (Signed)
Stable over past 24 hours.  Tolerating diet.  No abdominal pain.  Normal bowel function. Hemoglobin 8.7-up from 7.31 unit of packed RBCs.  No evidence of GI bleeding.  Vital signs in last 24 hours: Temp:  [97.3 F (36.3 C)-98 F (36.7 C)] 98 F (36.7 C) (07/06 0937) Pulse Rate:  [79-101] 95 (07/06 0937) Resp:  [16-20] 20 (07/06 0937) BP: (141-181)/(69-104) 148/69 (07/06 0937) SpO2:  [9 %-100 %] 97 % (07/06 0937) Last BM Date : 08/18/22 General:   Alert, talkative pleasant and cooperative in NAD Abdomen:  Soft, nontender and nondistended.  Normal bowel sounds, without guarding, and without rebound.  No mass or organomegaly. Extremities:  Without clubbing or edema.    Intake/Output from previous day: 07/05 0701 - 07/06 0700 In: 886 [P.O.:480; Blood:406] Out: -  Intake/Output this shift: No intake/output data recorded.  Lab Results: Recent Labs    08/18/22 0439 08/18/22 2003 08/19/22 0426 08/20/22 0452  WBC 9.0  --  10.1 11.1*  HGB 7.5* 7.8* 7.3* 8.7*  HCT 27.0* 27.6* 26.1* 30.3*  PLT 293  --  267 289   BMET Recent Labs    08/18/22 0439 08/19/22 0426 08/20/22 0452  NA 142 139 139  K 3.3* 3.9 3.6  CL 105 106 104  CO2 29 26 26   GLUCOSE 95 117* 109*  BUN 7* 14 15  CREATININE 0.68 0.71 0.74  CALCIUM 8.2* 8.5* 8.8*   LFT Recent Labs    08/18/22 0439  PROT 6.5  ALBUMIN 3.2*  AST 16  ALT 18  ALKPHOS 82  BILITOT 0.4   PT/INR Recent Labs    08/17/22 1204  LABPROT 16.0*  INR 1.3*   Hepatitis Panel No results for input(s): "HEPBSAG", "HCVAB", "HEPAIGM", "HEPBIGM" in the last 72 hours. C-Diff No results for input(s): "CDIFFTOX" in the last 72 hours.  Studies/Results: No results found.  Impression:   Very pleasant 72 year old lady with multiple core morbidities including atrial fibrillation on Eliquis presents to the ED with symptomatic profound iron deficiency anemia requiring 1 unit of packed RBCs overnight.  No evidence of GI bleeding.  History of  significant colon polyps; overdue for surveillance.  She is essentially devoid of any GI symptoms.  Her Eliquis has been held.  Recommendations:  I recommended she undergo GI evaluation including updated colonoscopy and possible EGD in the near future.  Patient made it very clear to me she does not want a GI evaluation while she is here nor does she want to come back the first of the week for an outpatient evaluation.  She wants to get her cardioversion done on the 10th and then deal with GI evaluation.  This is not unreasonable as right now she only has iron deficiency anemia and no evidence of GI bleeding.  From a GI standpoint, she can go home and come back after July 10 for GI evaluation.  The interim, track her H and H closely; daily PPI empirically  There is no pressing reason to hold her Eliquis at this point from a GI standpoint.  I would recommend resuming for her upcoming cardioversion but will defer to the hospitalist.  My office will arrange outpatient colonoscopy with possible EGD to follow later in the month.  From a GI standpoint she can be discharged today

## 2022-08-21 DIAGNOSIS — E876 Hypokalemia: Secondary | ICD-10-CM | POA: Diagnosis not present

## 2022-08-21 DIAGNOSIS — D508 Other iron deficiency anemias: Secondary | ICD-10-CM | POA: Diagnosis not present

## 2022-08-21 DIAGNOSIS — K219 Gastro-esophageal reflux disease without esophagitis: Secondary | ICD-10-CM | POA: Diagnosis present

## 2022-08-21 DIAGNOSIS — J189 Pneumonia, unspecified organism: Secondary | ICD-10-CM | POA: Diagnosis not present

## 2022-08-21 DIAGNOSIS — G4733 Obstructive sleep apnea (adult) (pediatric): Secondary | ICD-10-CM | POA: Diagnosis not present

## 2022-08-21 DIAGNOSIS — Z7951 Long term (current) use of inhaled steroids: Secondary | ICD-10-CM

## 2022-08-21 DIAGNOSIS — J449 Chronic obstructive pulmonary disease, unspecified: Secondary | ICD-10-CM | POA: Diagnosis present

## 2022-08-21 LAB — CBC
HCT: 28.4 % — ABNORMAL LOW (ref 36.0–46.0)
Hemoglobin: 8.2 g/dL — ABNORMAL LOW (ref 12.0–15.0)
MCH: 22.2 pg — ABNORMAL LOW (ref 26.0–34.0)
MCHC: 28.9 g/dL — ABNORMAL LOW (ref 30.0–36.0)
MCV: 77 fL — ABNORMAL LOW (ref 80.0–100.0)
Platelets: 269 10*3/uL (ref 150–400)
RBC: 3.69 MIL/uL — ABNORMAL LOW (ref 3.87–5.11)
RDW: 18.1 % — ABNORMAL HIGH (ref 11.5–15.5)
WBC: 8.7 10*3/uL (ref 4.0–10.5)
nRBC: 0.2 % (ref 0.0–0.2)

## 2022-08-21 MED ORDER — CEFDINIR 300 MG PO CAPS: 300.0000 mg | ORAL_CAPSULE | Freq: Two times a day (BID) | ORAL | 0 refills | Status: AC

## 2022-08-21 MED ORDER — PANTOPRAZOLE SODIUM 40 MG PO TBEC: 40.0000 mg | DELAYED_RELEASE_TABLET | Freq: Every day | ORAL | 1 refills | Status: DC

## 2022-08-21 NOTE — Assessment & Plan Note (Signed)
-  Exacerbation appreciated -Continue home bronchodilator management and outpatient follow-up with pulmonologist.

## 2022-08-21 NOTE — Discharge Summary (Signed)
Physician Discharge Summary   Patient: Carolyn Silva MRN: [id number] DOB: 03/17/1950  Admit date:     08/17/2022  Discharge date: 08/21/22  Discharge Physician: Vassie Loll   PCP: Benita Stabile, MD   Recommendations at discharge:  Repeat CBC to follow hemoglobin trend/stability Repeat basic metabolic panel to follow electrolytes and renal function Reassess blood pressure and further adjust antihypertensive as needed Atrial patient follow-up with cardiology and gastroenterology service as instructed/requested. Repeat CBC to follow hemoglobin trend/stability.  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 was admitted to the hospital with the working diagnosis of pneumonia.   72 yo female with the past medical history of asthma, hypertension, atrial fibrillation, OSA and obesity who presented with dyspnea and cough. Reported worsening symptoms for the last 2 days. Intermittent palpitations, positive PND and peripheral edema. In the ED her blood pressure was 189/89, RR 92, rr 21 and 02 saturation 94%,  Lungs with wheezing bilaterally with no rhonchi or rales, heart with S1 and S2 present and irregularly irregular, abdomen soft and non distended, positive bilateral lower extremity edema.   Na 142, K 3,3 CL 105 bicarbonate 29, glucose 95 bun 7 cr 0,68  Wbc 9.0 hgb 7,5 plt 293   Chest radiograph with cardiomegaly, with no effusions or infiltrates. Prominent vasculature on the right hilar region.   CT chest with no pulmonary embolism. Patchy air space disease in the right upper lobe consistent with pneumonia. Centrally located infiltrate.   EKG 89 bpm,  normal axis, normal intervals, atrial fibrillation rhythm with no significant ST segment or T wave changes.   Patient had furosemide in the ED with improvement in her symptoms.  She has been placed on antibiotic therapy.     Assessment and Plan: * Community acquired pneumonia Asthma exacerbation; improved/stabilized after receiving IV steroids, nebulizer management and bronchodilator treatments. Symptoms have improved resolved at baseline.Marland Kitchen  02 saturation is 96% on room air,   Patient has been afebrile and no leukocytosis appreciated.  Repeat chest x-ray in 6-8 weeks to follow resolution of lung infiltrates.  Paroxysmal atrial fibrillation (HCC) Patient in persistent atrial fibrillation.   Had signs of volume overload on admission. Last echocardiogram from 2021 with preserved LV systolic function,   Will check echocardiogram this admission. Continue anticoagulation with apixaban.  Gastroenterology looking to pursued endoscopic and colonoscopy as an outpatient; patient clear to resume the use of Eliquis and pursue cardioversion as previously instructed.   Essential hypertension -Continue home antihypertensive regimen -Heart healthy/low-sodium diet discussed with patient.  Hypokalemia Renal function stable with serum cr at 0,71, K is 3,9 and serum bicarbonate at 26. Na 139  Plan to continue close follow up renal function and electrolytes.  Patient advised to maintain adequate hydration and to follow heart healthy diet.  OSA (obstructive sleep apnea) Continue CPAP at night Weight loss management discussed with patient.  Iron deficiency anemia Severe symptomatic iron deficiency anemia.  Iron 13, TIBC 435, transferrin saturation 3 and ferritin 7. Plan to complete cardioversion as planned and when further is stable pursue endoscopy and colonoscopy as an outpatient.  Continue to follow hemoglobin trend. Consult GI for workup for occult GI bleed.  Follow up Hgb post PRBC  transfusion.   Class 3 obesity (HCC) -Calculated BMI is 43.7  -Calorie diet, portion control and increase physical activity discussed with patient.  Gastroesophageal reflux disease -continue PPI as recommended by GI service.  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  Chronic diastolic heart failure -Continue to follow low-sodium diet -Continue to check weight on daily basis -Maintain adequate hydration -Continue treatment of blood pressure. -Outpatient follow-up with cardiology service recommended. -Mediated by atrial fibrillation.  Consultants: Cardiology service and gastroenterology Procedures performed: See below for x-ray report. Disposition: Home Diet recommendation: Heart healthy/low calorie diet.  DISCHARGE MEDICATION: Allergies as of 08/21/2022       Reactions   Strawberry C [ascorbic Acid] Hives        Medication List     TAKE these medications    albuterol 108 (90 Base) MCG/ACT inhaler Commonly known as: VENTOLIN HFA Inhale 2 puffs into the lungs every 4 (four) hours as needed for wheezing or shortness of breath.   apixaban 5 MG Tabs tablet Commonly known as: Eliquis Take 1 tablet (5 mg total) by mouth 2 (two) times daily. PT NEEDS APPT WITH DR MCDOWELL FOR FUTURE REFILLS.   cefdinir 300 MG capsule Commonly known as: OMNICEF Take 1 capsule (300 mg total) by mouth every 12 (twelve) hours for 4 days.   ipratropium-albuterol 0.5-2.5 (3) MG/3ML Soln Commonly known as: DUONEB Take 3 mLs by nebulization in the morning, at noon, and at bedtime.   losartan 100 MG tablet Commonly known as: COZAAR Take 1 tablet (100 mg total) by mouth daily.   mometasone-formoterol 100-5 MCG/ACT Aero Commonly known as: DULERA Inhale 2 puffs into the lungs in the morning and at bedtime.   pantoprazole 40 MG tablet Commonly known as: PROTONIX Take 1  tablet (40 mg total) by mouth daily. Start taking on: August 22, 2022        Follow-up Information     Benita Stabile, MD .   Specialty: Internal Medicine Contact information: [address] [place] [address] [phone]         Jonelle Sidle, MD .   Specialty: Cardiology Contact information: [address] [place] [address] [phone]                Discharge Exam: Ceasar Mons Weights   08/17/22 1117 08/17/22 2134  Weight: 113.4 kg 119.3 kg   General exam: Alert, awake, oriented x 3; no chest pain, no nausea, no vomiting.  No overt bleeding appreciated.  Feeling ready to go home. Respiratory system: Clear to auscultation. Respiratory effort normal.  Good saturation on room air. Cardiovascular system:RRR. No rubs or gallops; no JVD. Gastrointestinal system: Abdomen is obese, nondistended, soft and nontender. No organomegaly or masses felt. Normal bowel sounds heard. Central nervous system: Alert and oriented. No focal neurological deficits. Extremities: No cyanosis or clubbing; no edema Skin: No petechiae. Psychiatry: Judgement and insight appear normal. Mood & affect appropriate.   Condition at discharge: Stable and improved.  The results of significant diagnostics from this hospitalization (including imaging, microbiology, ancillary and laboratory) are listed below for reference.   Imaging Studies: ECHOCARDIOGRAM COMPLETE  Result Date: 08/20/2022    ECHOCARDIOGRAM REPORT   Patient Name:   Carolyn Silva Date of Exam: 08/20/2022 Medical Rec #:  [id number]         Height:       65.0 in Accession #:  [id number]        Weight:       263.0 lb Date of Birth:  17-Oct-1950          BSA:          2.222 m Patient Age:    71 years          BP:           148/69 mmHg Patient Gender: F                 HR:           74 bpm. [facility] Procedure: 2D Echo, Cardiac Doppler and Color Doppler Indications:    Dyspnea R06.00  History:        Patient has  prior history of Echocardiogram examinations, most                 recent 07/11/2019. Arrythmias:Atrial Fibrillation; Risk                 Factors:Hypertension. OSA (obstructive sleep apnea).  Sonographer:    Celesta Gentile RCS Referring Phys: [id number] MAURICIO DANIEL ARRIEN IMPRESSIONS  1. Left ventricular ejection fraction, by estimation, is 60 to 65%. The left ventricle has normal function. The left ventricle has no regional wall motion abnormalities. There is mild left ventricular hypertrophy. Left ventricular diastolic parameters are indeterminate.  2. Right ventricular systolic function is normal. The right ventricular size is normal.  3. Left atrial size was moderately dilated.  4. Right atrial size was mildly dilated.  5. The mitral valve is normal in structure. No evidence of mitral valve regurgitation. No evidence of mitral stenosis.  6. The aortic valve is tricuspid. There is mild calcification of the aortic valve. There is mild thickening of the aortic valve. Aortic valve regurgitation is not visualized. Aortic valve sclerosis is present, with no evidence of aortic valve stenosis.  7. The inferior vena cava is normal in size with greater than 50% respiratory variability, suggesting right atrial pressure of 3 mmHg. FINDINGS  Left Ventricle: Left ventricular ejection fraction, by estimation, is 60 to 65%. The left ventricle has normal function. The left ventricle has no regional wall motion abnormalities. The left ventricular internal cavity size was normal in size. There is  mild left ventricular hypertrophy. Left ventricular diastolic parameters are indeterminate. Right Ventricle: The right ventricular size is normal. No increase in right ventricular wall thickness. Right ventricular systolic function is normal. Left Atrium: Left atrial size was moderately dilated. Right Atrium: Right atrial size was mildly dilated. Pericardium: There is no evidence of pericardial effusion. Mitral Valve: The mitral valve is  normal in structure. No evidence of mitral valve regurgitation. No evidence of mitral valve stenosis. Tricuspid Valve: The tricuspid valve is normal in structure. Tricuspid valve regurgitation is not demonstrated. No evidence of tricuspid stenosis. Aortic Valve: The aortic valve is tricuspid. There is mild calcification of the aortic valve. There is mild thickening of the aortic valve. Aortic valve regurgitation is not visualized. Aortic valve sclerosis is present, with no evidence of aortic valve stenosis. Pulmonic Valve: The pulmonic valve was normal in structure. Pulmonic valve regurgitation is trivial. No evidence of pulmonic stenosis. Aorta: The aortic root is normal in size and structure. Venous: The inferior vena cava is normal in size with greater than 50% respiratory variability, suggesting right atrial pressure of 3 mmHg. IAS/Shunts: No atrial level shunt detected by color flow Doppler.  LEFT VENTRICLE PLAX 2D LVIDd:  4.70 cm LVIDs:         3.20 cm LV PW:         1.40 cm LV IVS:        1.20 cm LVOT diam:     1.80 cm LV SV:         55 LV SV Index:   25 LVOT Area:     2.54 cm  RIGHT VENTRICLE TAPSE (M-mode): 1.7 cm LEFT ATRIUM              Index        RIGHT ATRIUM           Index LA diam:        4.40 cm  1.98 cm/m   RA Area:     24.80 cm LA Vol (A2C):   66.5 ml  29.92 ml/m  RA Volume:   78.40 ml  35.28 ml/m LA Vol (A4C):   110.0 ml 49.50 ml/m LA Biplane Vol: 91.5 ml  41.17 ml/m  AORTIC VALVE LVOT Vmax:   111.00 cm/s LVOT Vmean:  79.000 cm/s LVOT VTI:    0.216 m  AORTA Ao Root diam: 3.80 cm MITRAL VALVE MV Area (PHT): 5.13 cm     SHUNTS MV Decel Time: 148 msec     Systemic VTI:  0.22 m MV E velocity: 142.00 cm/s  Systemic Diam: 1.80 cm Charlton Haws MD Electronically signed by Charlton Haws MD Signature Date/Time: 08/20/2022/1:16:14 PM    Final    CT Angio Chest PE W/Cm &/Or Wo Cm  Result Date: 08/17/2022 CLINICAL DATA:  Tachycardia and hypoxia, initial encounter EXAM: CT ANGIOGRAPHY CHEST WITH  CONTRAST TECHNIQUE: Multidetector CT imaging of the chest was performed using the standard protocol during bolus administration of intravenous contrast. Multiplanar CT image reconstructions and MIPs were obtained to evaluate the vascular anatomy. RADIATION DOSE REDUCTION: This exam was performed according to the departmental dose-optimization program which includes automated exposure control, adjustment of the mA and/or kV according to patient size and/or use of iterative reconstruction technique. CONTRAST:  75mL OMNIPAQUE IOHEXOL 350 MG/ML SOLN COMPARISON:  Chest x-ray from earlier in the same day. FINDINGS: Cardiovascular: Atherosclerotic calcifications of the thoracic aorta are noted. No aneurysmal dilatation or dissection is noted. The heart is enlarged in size. Pulmonary artery shows a normal branching pattern. No filling defect to suggest pulmonary embolism is noted. No significant coronary calcifications are noted. Mediastinum/Nodes: Thoracic inlet is within normal limits. No hilar or mediastinal adenopathy is noted. The esophagus as visualized is within normal limits. Lungs/Pleura: Lungs are well aerated bilaterally. Patchy sub solid airspace opacities are noted in the right upper lobe consistent with early infiltrate. Similar findings are noted in the superior segment of the right lower lobe. Upper Abdomen: Visualized upper abdomen is unremarkable. Musculoskeletal: Degenerative changes of the thoracic spine are noted. No rib abnormality is noted. Review of the MIP images confirms the above findings. IMPRESSION: No evidence of pulmonary emboli. Patchy airspace opacities primarily in the right upper lobe consistent with pneumonia. As this was not well seen on prior chest x-ray, noncontrast CT is recommended in 3-4 weeks following trial of antibiotic therapy to ensure resolution and exclude underlying malignancy. Aortic Atherosclerosis (ICD10-I70.0). Electronically Signed   By: Alcide Clever M.D.   On:  08/17/2022 19:19   DG Chest Port 1 View  Result Date: 08/17/2022 CLINICAL DATA:  Atrial fibrillation EXAM: PORTABLE CHEST 1 VIEW COMPARISON:  Chest radiograph dated 06/22/2022 FINDINGS: Normal lung volumes. No focal consolidations. No  pleural effusion or pneumothorax. Similar enlarged cardiomediastinal silhouette. No acute osseous abnormality. IMPRESSION: 1.  No focal consolidations. 2. Similar cardiomegaly. Electronically Signed   By: Agustin Cree M.D.   On: 08/17/2022 11:39    Microbiology: Results for orders placed or performed during the hospital encounter of 08/17/22  MRSA Next Gen by PCR, Nasal     Status: None   Collection Time: 08/17/22  4:15 PM   Specimen: Nasal Mucosa; Nasal Swab  Result Value Ref Range Status   MRSA by PCR Next Gen NOT DETECTED NOT DETECTED Final    Comment: (NOTE) The GeneXpert MRSA Assay (FDA approved for NASAL specimens only), is one component of a comprehensive MRSA colonization surveillance program. It is not intended to diagnose MRSA infection nor to guide or monitor treatment for MRSA infections. Test performance is not FDA approved in patients less than 14 years old. Performed at [facility], [address] [place], [place] [address]     Labs: CBC: Recent Labs  Lab 08/17/22 1204 08/18/22 0439 08/18/22 2003 08/19/22 0426 08/20/22 0452 08/21/22 0357  WBC 8.3 9.0  --  10.1 11.1* 8.7  NEUTROABS  --  6.9  --   --   --   --   HGB 8.1* 7.5* 7.8* 7.3* 8.7* 8.2*  HCT 28.4* 27.0* 27.6* 26.1* 30.3* 28.4*  MCV 75.7* 76.9*  --  76.8* 77.1* 77.0*  PLT 263 293  --  267 289 269   Basic Metabolic Panel: Recent Labs  Lab 08/17/22 1204 08/18/22 0439 08/19/22 0426 08/20/22 0452  NA 141 142 139 139  K 3.1* 3.3* 3.9 3.6  CL 105 105 106 104  CO2 28 29 26 26   GLUCOSE 89 95 117* 109*  BUN 8 7* 14 15  CREATININE 0.68 0.68 0.71 0.74  CALCIUM 8.4* 8.2* 8.5* 8.8*  MG  --  2.1  --  2.2   Liver Function Tests: Recent Labs  Lab 08/18/22 0439  AST 16  ALT  18  ALKPHOS 82  BILITOT 0.4  PROT 6.5  ALBUMIN 3.2*   CBG: No results for input(s): "GLUCAP" in the last 168 hours.  Discharge time spent: greater than 30 minutes.  Signed: Vassie Loll, MD [facility] 08/21/2022

## 2022-08-21 NOTE — Assessment & Plan Note (Signed)
-  continue PPI as recommended by GI service.

## 2022-08-23 DIAGNOSIS — J9600 Acute respiratory failure, unspecified whether with hypoxia or hypercapnia: Secondary | ICD-10-CM | POA: Diagnosis not present

## 2022-08-23 DIAGNOSIS — I480 Paroxysmal atrial fibrillation: Secondary | ICD-10-CM

## 2022-08-24 ENCOUNTER — Ambulatory Visit (HOSPITAL_COMMUNITY): Admission: RE | Admit: 2022-08-24 | Payer: 59 | Source: Home / Self Care | Admitting: Cardiology

## 2022-08-24 ENCOUNTER — Encounter (HOSPITAL_COMMUNITY): Admission: RE | Payer: Self-pay | Source: Home / Self Care

## 2022-08-24 DIAGNOSIS — I480 Paroxysmal atrial fibrillation: Secondary | ICD-10-CM

## 2022-08-24 SURGERY — CARDIOVERSION
Anesthesia: Monitor Anesthesia Care

## 2022-08-29 DIAGNOSIS — R809 Proteinuria, unspecified: Secondary | ICD-10-CM | POA: Diagnosis not present

## 2022-08-29 DIAGNOSIS — E785 Hyperlipidemia, unspecified: Secondary | ICD-10-CM | POA: Diagnosis not present

## 2022-08-29 DIAGNOSIS — R7303 Prediabetes: Secondary | ICD-10-CM | POA: Diagnosis not present

## 2022-09-05 DIAGNOSIS — R809 Proteinuria, unspecified: Secondary | ICD-10-CM | POA: Diagnosis not present

## 2022-09-05 DIAGNOSIS — K219 Gastro-esophageal reflux disease without esophagitis: Secondary | ICD-10-CM | POA: Diagnosis not present

## 2022-09-05 DIAGNOSIS — R252 Cramp and spasm: Secondary | ICD-10-CM | POA: Diagnosis not present

## 2022-09-05 DIAGNOSIS — G4733 Obstructive sleep apnea (adult) (pediatric): Secondary | ICD-10-CM | POA: Diagnosis not present

## 2022-09-05 DIAGNOSIS — I4891 Unspecified atrial fibrillation: Secondary | ICD-10-CM | POA: Diagnosis not present

## 2022-09-05 DIAGNOSIS — E785 Hyperlipidemia, unspecified: Secondary | ICD-10-CM | POA: Diagnosis not present

## 2022-09-05 DIAGNOSIS — D649 Anemia, unspecified: Secondary | ICD-10-CM | POA: Diagnosis not present

## 2022-09-05 DIAGNOSIS — R7303 Prediabetes: Secondary | ICD-10-CM | POA: Diagnosis not present

## 2022-09-05 DIAGNOSIS — Z0001 Encounter for general adult medical examination with abnormal findings: Secondary | ICD-10-CM | POA: Diagnosis not present

## 2022-09-05 DIAGNOSIS — I10 Essential (primary) hypertension: Secondary | ICD-10-CM | POA: Diagnosis not present

## 2022-09-20 DIAGNOSIS — J449 Chronic obstructive pulmonary disease, unspecified: Secondary | ICD-10-CM | POA: Diagnosis not present

## 2022-09-21 ENCOUNTER — Ambulatory Visit: Payer: 59 | Attending: Student | Admitting: Student

## 2022-09-21 ENCOUNTER — Encounter: Payer: Self-pay | Admitting: *Deleted

## 2022-09-21 ENCOUNTER — Encounter: Payer: Self-pay | Admitting: Student

## 2022-09-21 VITALS — BP 122/72 | HR 82 | Ht 65.0 in | Wt 260.0 lb

## 2022-09-21 DIAGNOSIS — D509 Iron deficiency anemia, unspecified: Secondary | ICD-10-CM | POA: Diagnosis not present

## 2022-09-21 DIAGNOSIS — G4733 Obstructive sleep apnea (adult) (pediatric): Secondary | ICD-10-CM

## 2022-09-21 DIAGNOSIS — I4819 Other persistent atrial fibrillation: Secondary | ICD-10-CM | POA: Diagnosis not present

## 2022-09-21 DIAGNOSIS — I10 Essential (primary) hypertension: Secondary | ICD-10-CM | POA: Diagnosis not present

## 2022-09-21 MED ORDER — FERROUS SULFATE 325 (65 FE) MG PO TABS: 325.0000 mg | ORAL_TABLET | Freq: Every day | ORAL | 3 refills | Status: AC

## 2022-09-21 MED ORDER — APIXABAN 5 MG PO TABS: 5.0000 mg | ORAL_TABLET | Freq: Two times a day (BID) | ORAL | 11 refills | Status: DC

## 2022-09-21 NOTE — Patient Instructions (Signed)
Medication Instructions:  Your physician recommends that you continue on your current medications as directed. Please refer to the Current Medication list given to you today.  Start Iron 325 mg Daily   *If you need a refill on your cardiac medications before your next appointment, please call your pharmacy*   Lab Work: NONE   If you have labs (blood work) drawn today and your tests are completely normal, you will receive your results only by: MyChart Message (if you have MyChart) OR A paper copy in the mail If you have any lab test that is abnormal or we need to change your treatment, we will call you to review the results.   Testing/Procedures: Your physician has recommended that you have a Cardioversion (DCCV). Electrical Cardioversion uses a jolt of electricity to your heart either through paddles or wired patches attached to your chest. This is a controlled, usually prescheduled, procedure. Defibrillation is done under light anesthesia in the hospital, and you usually go home the day of the procedure. This is done to get your heart back into a normal rhythm. You are not awake for the procedure. Please see the instruction sheet given to you today.    Follow-Up: At [facility], you and your health needs are our priority.  As part of our continuing mission to provide you with exceptional heart care, we have created designated Provider Care Teams.  These Care Teams include your primary Cardiologist (physician) and Advanced Practice Providers (APPs -  Physician Assistants and Nurse Practitioners) who all work together to provide you with the care you need, when you need it.  We recommend signing up for the patient portal called "MyChart".  Sign up information is provided on this After Visit Summary.  MyChart is used to connect with patients for Virtual Visits (Telemedicine).  Patients are able to view lab/test results, encounter notes, upcoming appointments, etc.  Non-urgent  messages can be sent to your provider as well.   To learn more about what you can do with MyChart, go to [email or web address].    Your next appointment:   4 -6 week(s)  Provider:   You may see Nona Dell, MD or one of the following Advanced Practice Providers on your designated Care Team:   [name], PA-C  Jacolyn Reedy, PA-C     Other Instructions Thank you for choosing [facility]!

## 2022-09-21 NOTE — Progress Notes (Signed)
Cardiology Office Note    Date:  09/21/2022  ID:  Carolyn Silva, DOB 1950/06/23, MRN [id number] Cardiologist: Nona Dell, MD    History of Present Illness:    Carolyn Silva is a 72 y.o. female  with past medical history of persistent atrial fibrillation/flutter (s/p DCCVx2 in 06/2019), nocturnal pauses (occurred prior to CPAP use), OSA, HTN and asthma who presents to the office today to discuss cardioversion.  She was examined by myself in 07/2022 and had recently been diagnosed with recurrent atrial fibrillation with RVR and Dr. Diona Browner had recommended a DCCV if this was persistent. At the time of her visit, she reported feeling well and denied any recent chest pain or palpitations. She had not been on AV nodal blocking agents given her history of nocturnal pauses and the options of a DCCV were reviewed and she wished to proceed with this.  In the interim, she was admitted to [facility] from 7/3 - 08/21/2022 for evaluation of worsening dyspnea and coughing for the past 2 days. She was found to have community-acquired pneumonia and treated with antibiotics and steroids. Was also found to have worsening anemia as hemoglobin had previously been at 8.9 and had declined to 8.1 on admission and as low as 7.3 on 08/19/2022. GI was consulted and was recommended to transfuse 1 unit pRBC's and to consider colonoscopy or EGD as an inpatient or outpatient. She was discharged home as she wished to have this as an outpatient. Ferritin was low at 7 but it does not appear she was started on iron supplementation.  In talking with the patient today, she reports "feeling great" since her recent hospitalization. Says that her breathing has significantly improved and she denies any dyspnea on exertion, orthopnea or PND. She does use a CPAP at night. No recent chest pain or palpitations. She did have follow-up labs by her PCP on 08/29/2022 and her hemoglobin had improved to 10.1. She denies any melena, hematochezia  or hematuria.  Reports she did not hear back from GI in regards to arranging a follow-up visit. Today is her 72st birthday!  Studies Reviewed:   EKG: EKG is ordered today and demonstrates:   EKG Interpretation Date/Time:  Wednesday September 21 2022 15:24:36 EDT Ventricular Rate:  82 PR Interval:    QRS Duration:  82 QT Interval:  382 QTC Calculation: 446 R Axis:   16  Text Interpretation: Atrial fibrillation No acute changes Confirmed by [name] ([id number]) on 09/21/2022 3:28:52 PM       Echocardiogram: 08/2022 IMPRESSIONS     1. Left ventricular ejection fraction, by estimation, is 60 to 65%. The  left ventricle has normal function. The left ventricle has no regional  wall motion abnormalities. There is mild left ventricular hypertrophy.  Left ventricular diastolic parameters  are indeterminate.   2. Right ventricular systolic function is normal. The right ventricular  size is normal.   3. Left atrial size was moderately dilated.   4. Right atrial size was mildly dilated.   5. The mitral valve is normal in structure. No evidence of mitral valve  regurgitation. No evidence of mitral stenosis.   6. The aortic valve is tricuspid. There is mild calcification of the  aortic valve. There is mild thickening of the aortic valve. Aortic valve  regurgitation is not visualized. Aortic valve sclerosis is present, with  no evidence of aortic valve stenosis.   7. The inferior vena cava is normal in size with greater than 50%  respiratory variability, suggesting right atrial pressure of 3 mmHg.    Risk Assessment/Calculations:    CHA2DS2-VASc Score = 3   This indicates a 3.2% annual risk of stroke. The patient's score is based upon: CHF History: 0 HTN History: 1 Diabetes History: 0 Stroke History: 0 Vascular Disease History: 0 Age Score: 1 Gender Score: 1    Physical Exam:   VS:  BP 122/72   Pulse 82   Ht 5\' 5"  (1.651 m)   Wt 260 lb (117.9 kg)   SpO2 97%   BMI  43.27 kg/m    Wt Readings from Last 3 Encounters:  09/21/22 260 lb (117.9 kg)  08/17/22 263 lb 0.1 oz (119.3 kg)  07/26/22 254 lb 3.2 oz (115.3 kg)     GEN: Well nourished, well developed female appearing in no acute distress NECK: No JVD; No carotid bruits CARDIAC: Irregularly irregular, no murmurs, rubs, gallops RESPIRATORY:  Clear to auscultation without rales, wheezing or rhonchi  ABDOMEN: Appears non-distended. No obvious abdominal masses. EXTREMITIES: No clubbing or cyanosis. Trace lower extremity edema.  Distal pedal pulses are 2+ bilaterally.   Assessment and Plan:   1. Persistent Atrial Fibrillation/Flutter - She denies any recent palpitations and her heart rate is well-controlled in the 80's during today's visit. She has not been on AV nodal blocking agents given prior nocturnal bradycardia. Given that her hemoglobin had improved to 10.1 by recent labs and no reports of active bleeding, we again reviewed a DCCV and she is in agreement for proceeding with this. If this is scheduled for after 09/29/2022, she will need a repeat CBC and BMET. Labs by review of [facility] DXA were obtained on 08/29/2022 and showed Na+ 143, K+ 4.7, hemoglobin 10.1 and platelets 270 K.  - Continue Eliquis 5 mg twice daily for anticoagulation which is the appropriate dose at this time given her age, weight and renal function.  Shared Decision Making/Informed Consent{ The risks (stroke, cardiac arrhythmias rarely resulting in the need for a temporary or permanent pacemaker, skin irritation or burns and complications associated with conscious sedation including aspiration, arrhythmia, respiratory failure and death), benefits (restoration of normal sinus rhythm) and alternatives of a direct current cardioversion were explained in detail to Ms. Plaza and she agrees to proceed.   2. HTN - BP is well-controlled at 122/72 during today's visit. Continue current medical therapy with Losartan 100 mg daily.  3.  OSA - Continued compliance with CPAP encouraged.  4. Anemia - She did have follow-up labs by her PCP on 08/29/2022 and hemoglobin had improved to 10.1 with platelets at 270K.  She denies any evidence of active bleeding as discussed above. Given that her ferritin was low at 7 during her recent admission, I did recommend that she start iron supplementation. Will also send a message to GI today to help with arranging follow-up.   Signed, Ellsworth Lennox, PA-C

## 2022-09-22 NOTE — Patient Instructions (Signed)
Carolyn Silva  09/22/2022     @PREFPERIOPPHARMACY @   Your procedure is scheduled on  09/27/2022.   Report to [facility] at  0700  A.M.   Call this number if you have problems the morning of surgery:  [phone]  If you experience any cold or flu symptoms such as cough, fever, chills, shortness of breath, etc. between now and your scheduled surgery, please notify us at the above number.   Remember:  Do not eat or drink after midnight.        DO NOT miss any doses of your eliquis.      Use your nebulizer and you inhaler before you come and bring your rescue inhaler with you.     Take these medicines the morning of surgery with A SIP OF WATER                                       pantoprazole.    Do not wear jewelry, make-up or nail polish, including gel polish,  artificial nails, or any other type of covering on natural nails (fingers and  toes).  Do not wear lotions, powders, or perfumes, or deodorant.  Do not shave 48 hours prior to surgery.  Men may shave face and neck.  Do not bring valuables to the hospital.  [facility] is not responsible for any belongings or valuables.  Contacts, dentures or bridgework may not be worn into surgery.  Leave your suitcase in the car.  After surgery it may be brought to your room.  For patients admitted to the hospital, discharge time will be determined by your treatment team.  Patients discharged the day of surgery will not be allowed to drive home and must have someone with them for 24 hours.    Special instructions:   DO NOT smoke tobacco or vape for 24 hours before your procedure.  Please read over the following fact sheets that you were given. Anesthesia Post-op Instructions and Care and Recovery After Surgery      Electrical Cardioversion Electrical cardioversion is the delivery of a jolt of electricity to restore a normal rhythm to the heart. A rhythm that is too fast or is not regular (arrhythmia) keeps the  heart from pumping blood well. There is also another type of cardioversion called a chemical (pharmacologic) cardioversion. This is when your health care provider gives you one or more medicines to bring back your regular heart rhythm. Electrical cardioversion is done as a scheduled procedure for arrhythmiasthat are not life-threatening. Electrical cardioversion may also be done in an emergency for sudden life-threatening arrhythmias. Tell a health care provider about: Any allergies you have. All medicines you are taking, including vitamins, herbs, eye drops, creams, and over-the-counter medicines. Any problems you or family members have had with sedatives or anesthesia. Any bleeding problems you have. Any surgeries you have had, including a pacemaker, defibrillator, or other implanted device. Any medical conditions you have. Whether you are pregnant or may be pregnant. What are the risks? Your provider will talk with you about risks. These include: Allergic reactions to medicines. Irritation to the skin on your chest or back where the sticky pads (electrodes) or paddles were put during electrical cardioversion. A blood clot that breaks free and travels to other parts of your body, such as your brain. Return of a worse abnormal heart rhythm that  will need to be treated with medicines, a pacemaker, or an implantable cardioverter defibrillator (ICD). What happens before the procedure? Medicines Your provider may give you: Blood-thinning medicines (anticoagulants) so your blood does not clot as easily. If your provider gives you this medicine, you may need to take it for 4 weeks before the procedure. Medicines to help stabilize your heart rate and rhythm. Ask your provider about: Changing or stopping your regular medicines. These include any diabetes medicines or blood thinners you take. Taking medicines such as aspirin and ibuprofen. These medicines can thin your blood. Do not take them unless  your provider tells you to. Taking over-the-counter medicines, vitamins, herbs, and supplements. General instructions Follow instructions from your provider about what you may eat and drink. Do not put any lotions, powders, or ointments on your chest and back for 24 hours before the procedure. They can cause problems with the electrodes or paddles used to deliver electricity to your heart. Do not wear jewelry as this can interfere with delivering electricity to your heart. If you will be going home right after the procedure, plan to have a responsible adult: Take you home from the hospital or clinic. You will not be allowed to drive. Care for you for the time you are told. Tests You may have an exam or testing. This may include: Blood labs. A transesophageal echocardiogram (TEE). What happens during the procedure?     An IV will be inserted into one of your veins. You will be given a sedative. This helps you relax. Electrodes or metal paddles will be placed on your chest. They may be placed in one of these ways: One placed on your right chest, the other on the left ribs. One placed on your chest and the other on your back. An electrical shock will be delivered. The shock briefly stops (resets) your heart rhythm. Your provider will check to see if your heart rhythm is now normal. Some people need only one shock. Some need more to restore a normal heart rhythm. The procedure may vary among providers and hospitals. What happens after the procedure? Your blood pressure, heart rate, breathing rate, and blood oxygen level will be monitored until you leave the hospital or clinic. Your heart rhythm will be watched to make sure it does not change. This information is not intended to replace advice given to you by your health care provider. Make sure you discuss any questions you have with your health care provider. Document Revised: 09/23/2021 Document Reviewed: 09/23/2021 Elsevier Patient  Education  2024 [employer] Monitored Anesthesia Care, Care After The following information offers guidance on how to care for yourself after your procedure. Your health care provider may also give you more specific instructions. If you have problems or questions, contact your health care provider. What can I expect after the procedure? After the procedure, it is common to have: Tiredness. Little or no memory about what happened during or after the procedure. Impaired judgment when it comes to making decisions. Nausea or vomiting. Some trouble with balance. Follow these instructions at home: For the time period you were told by your health care provider:  Rest. Do not participate in activities where you could fall or become injured. Do not drive or use machinery. Do not drink alcohol. Do not take sleeping pills or medicines that cause drowsiness. Do not make important decisions or sign legal documents. Do not take care of children on your own. Medicines Take over-the-counter and prescription medicines only as  told by your health care provider. If you were prescribed antibiotics, take them as told by your health care provider. Do not stop using the antibiotic even if you start to feel better. Eating and drinking Follow instructions from your health care provider about what you may eat and drink. Drink enough fluid to keep your urine pale yellow. If you vomit: Drink clear fluids slowly and in small amounts as you are able. Clear fluids include water, ice chips, low-calorie sports drinks, and fruit juice that has water added to it (diluted fruit juice). Eat light and bland foods in small amounts as you are able. These foods include bananas, applesauce, rice, lean meats, toast, and crackers. General instructions  Have a responsible adult stay with you for the time you are told. It is important to have someone help care for you until you are awake and alert. If you have sleep apnea,  surgery and some medicines can increase your risk for breathing problems. Follow instructions from your health care provider about wearing your sleep device: When you are sleeping. This includes during daytime naps. While taking prescription pain medicines, sleeping medicines, or medicines that make you drowsy. Do not use any products that contain nicotine or tobacco. These products include cigarettes, chewing tobacco, and vaping devices, such as e-cigarettes. If you need help quitting, ask your health care provider. Contact a health care provider if: You feel nauseous or vomit every time you eat or drink. You feel light-headed. You are still sleepy or having trouble with balance after 24 hours. You get a rash. You have a fever. You have redness or swelling around the IV site. Get help right away if: You have trouble breathing. You have new confusion after you get home. These symptoms may be an emergency. Get help right away. Call 911. Do not wait to see if the symptoms will go away. Do not drive yourself to the hospital. This information is not intended to replace advice given to you by your health care provider. Make sure you discuss any questions you have with your health care provider. Document Revised: 06/28/2021 Document Reviewed: 06/28/2021 Elsevier Patient Education  2024 [employer].

## 2022-09-23 ENCOUNTER — Encounter (HOSPITAL_COMMUNITY)
Admission: RE | Admit: 2022-09-23 | Discharge: 2022-09-23 | Disposition: A | Discharge status: Home / Self Care | Payer: 59 | Source: Ambulatory Visit | Attending: Cardiology | Admitting: Cardiology

## 2022-09-23 VITALS — BP 122/72 | HR 82 | Temp 97.8°F | Resp 18 | Ht 65.0 in | Wt 260.0 lb

## 2022-09-23 DIAGNOSIS — Z01812 Encounter for preprocedural laboratory examination: Secondary | ICD-10-CM | POA: Insufficient documentation

## 2022-09-23 DIAGNOSIS — E876 Hypokalemia: Secondary | ICD-10-CM | POA: Diagnosis not present

## 2022-09-23 DIAGNOSIS — D508 Other iron deficiency anemias: Secondary | ICD-10-CM | POA: Diagnosis not present

## 2022-09-23 DIAGNOSIS — J9600 Acute respiratory failure, unspecified whether with hypoxia or hypercapnia: Secondary | ICD-10-CM | POA: Diagnosis not present

## 2022-09-23 LAB — CBC WITH DIFFERENTIAL/PLATELET
Abs Immature Granulocytes: 0.02 10*3/uL (ref 0.00–0.07)
Basophils Absolute: 0 10*3/uL (ref 0.0–0.1)
Basophils Relative: 1 %
Eosinophils Absolute: 0.3 10*3/uL (ref 0.0–0.5)
Eosinophils Relative: 4 %
HCT: 34.4 % — ABNORMAL LOW (ref 36.0–46.0)
Hemoglobin: 10.4 g/dL — ABNORMAL LOW (ref 12.0–15.0)
Immature Granulocytes: 0 %
Lymphocytes Relative: 20 %
Lymphs Abs: 1.3 10*3/uL (ref 0.7–4.0)
MCH: 24.2 pg — ABNORMAL LOW (ref 26.0–34.0)
MCHC: 30.2 g/dL (ref 30.0–36.0)
MCV: 80 fL (ref 80.0–100.0)
Monocytes Absolute: 0.4 10*3/uL (ref 0.1–1.0)
Monocytes Relative: 7 %
Neutro Abs: 4.3 10*3/uL (ref 1.7–7.7)
Neutrophils Relative %: 68 %
Platelets: 239 10*3/uL (ref 150–400)
RBC: 4.3 MIL/uL (ref 3.87–5.11)
RDW: 22.2 % — ABNORMAL HIGH (ref 11.5–15.5)
WBC: 6.3 10*3/uL (ref 4.0–10.5)
nRBC: 0 % (ref 0.0–0.2)

## 2022-09-23 LAB — BASIC METABOLIC PANEL
Anion gap: 9 (ref 5–15)
BUN: 10 mg/dL (ref 8–23)
CO2: 23 mmol/L (ref 22–32)
Calcium: 8.6 mg/dL — ABNORMAL LOW (ref 8.9–10.3)
Chloride: 105 mmol/L (ref 98–111)
Creatinine, Ser: 0.78 mg/dL (ref 0.44–1.00)
GFR, Estimated: >60 mL/min (ref >60)
Glucose, Bld: 98 mg/dL (ref 70–99)
Potassium: 3.5 mmol/L (ref 3.5–5.1)
Sodium: 137 mmol/L (ref 135–145)

## 2022-09-27 ENCOUNTER — Ambulatory Visit (HOSPITAL_COMMUNITY): Payer: Self-pay | Admitting: Anesthesiology

## 2022-09-27 ENCOUNTER — Encounter (HOSPITAL_COMMUNITY): Payer: Self-pay | Admitting: Cardiology

## 2022-09-27 ENCOUNTER — Other Ambulatory Visit: Payer: Self-pay

## 2022-09-27 ENCOUNTER — Encounter (HOSPITAL_COMMUNITY)
Admission: RE | Disposition: A | Discharge status: Home / Self Care | Payer: Self-pay | Source: Home / Self Care | Attending: Cardiology

## 2022-09-27 ENCOUNTER — Ambulatory Visit (HOSPITAL_COMMUNITY)
Admission: RE | Admit: 2022-09-27 | Discharge: 2022-09-27 | Disposition: A | Discharge status: Home / Self Care | Payer: 59 | Attending: Cardiology | Admitting: Cardiology

## 2022-09-27 ENCOUNTER — Ambulatory Visit (HOSPITAL_BASED_OUTPATIENT_CLINIC_OR_DEPARTMENT_OTHER): Payer: 59 | Admitting: Anesthesiology

## 2022-09-27 DIAGNOSIS — J4489 Other specified chronic obstructive pulmonary disease: Secondary | ICD-10-CM | POA: Insufficient documentation

## 2022-09-27 DIAGNOSIS — G4730 Sleep apnea, unspecified: Secondary | ICD-10-CM | POA: Diagnosis not present

## 2022-09-27 DIAGNOSIS — J440 Chronic obstructive pulmonary disease with acute lower respiratory infection: Secondary | ICD-10-CM | POA: Diagnosis not present

## 2022-09-27 DIAGNOSIS — I4819 Other persistent atrial fibrillation: Secondary | ICD-10-CM | POA: Insufficient documentation

## 2022-09-27 DIAGNOSIS — Z7901 Long term (current) use of anticoagulants: Secondary | ICD-10-CM | POA: Diagnosis not present

## 2022-09-27 DIAGNOSIS — I10 Essential (primary) hypertension: Secondary | ICD-10-CM | POA: Insufficient documentation

## 2022-09-27 DIAGNOSIS — I4892 Unspecified atrial flutter: Secondary | ICD-10-CM | POA: Diagnosis not present

## 2022-09-27 DIAGNOSIS — I480 Paroxysmal atrial fibrillation: Secondary | ICD-10-CM

## 2022-09-27 DIAGNOSIS — G4733 Obstructive sleep apnea (adult) (pediatric): Secondary | ICD-10-CM | POA: Insufficient documentation

## 2022-09-27 DIAGNOSIS — D649 Anemia, unspecified: Secondary | ICD-10-CM | POA: Insufficient documentation

## 2022-09-27 DIAGNOSIS — Z79899 Other long term (current) drug therapy: Secondary | ICD-10-CM | POA: Diagnosis not present

## 2022-09-27 HISTORY — PX: CARDIOVERSION: SHX1299

## 2022-09-27 SURGERY — CARDIOVERSION
Anesthesia: General

## 2022-09-27 MED ORDER — ORAL CARE MOUTH RINSE: 15.0000 mL | Freq: Once | OROMUCOSAL | Status: AC

## 2022-09-27 MED ORDER — LACTATED RINGERS IV SOLN: INTRAVENOUS | Status: DC

## 2022-09-27 MED ORDER — PROPOFOL 10 MG/ML IV BOLUS
INTRAVENOUS | Status: DC | PRN
  Administered 2022-09-27: 100 mg via INTRAVENOUS

## 2022-09-27 MED ORDER — CHLORHEXIDINE GLUCONATE 0.12 % MT SOLN
15.0000 mL | Freq: Once | OROMUCOSAL | Status: AC
  Administered 2022-09-27: 15 mL via OROMUCOSAL

## 2022-09-27 NOTE — Anesthesia Preprocedure Evaluation (Addendum)
Anesthesia Evaluation  Patient identified by MRN, date of birth, ID band Patient awake    Reviewed: Allergy & Precautions, H&P , NPO status , Patient's Chart, lab work & pertinent test results  History of Anesthesia Complications Negative for: history of anesthetic complications  Airway Mallampati: II  TM Distance: >3 FB Neck ROM: Full    Dental  (+) Edentulous Upper, Edentulous Lower   Pulmonary shortness of breath and with exertion, asthma , sleep apnea and Continuous Positive Airway Pressure Ventilation , pneumonia, resolved, COPD,  COPD inhaler   Pulmonary exam normal breath sounds clear to auscultation       Cardiovascular Exercise Tolerance: Poor METS: < 3 Mets hypertension, Pt. on medications + DOE  + dysrhythmias Atrial Fibrillation  Rhythm:Irregular Rate:Normal     Neuro/Psych negative neurological ROS  negative psych ROS   GI/Hepatic Neg liver ROS,GERD  Medicated and Controlled,,  Endo/Other    Morbid obesity  Renal/GU negative Renal ROS  negative genitourinary   Musculoskeletal negative musculoskeletal ROS (+)    Abdominal   Peds negative pediatric ROS (+)  Hematology  (+) Blood dyscrasia, anemia   Anesthesia Other Findings   Reproductive/Obstetrics negative OB ROS                             Anesthesia Physical Anesthesia Plan  ASA: 3  Anesthesia Plan: General   Post-op Pain Management: Minimal or no pain anticipated   Induction: Intravenous  PONV Risk Score and Plan: 0 and Propofol infusion  Airway Management Planned: Nasal Cannula and Natural Airway  Additional Equipment:   Intra-op Plan:   Post-operative Plan:   Informed Consent: I have reviewed the patients History and Physical, chart, labs and discussed the procedure including the risks, benefits and alternatives for the proposed anesthesia with the patient or authorized representative who has indicated  his/her understanding and acceptance.     Dental advisory given  Plan Discussed with: CRNA and Surgeon  Anesthesia Plan Comments:        Anesthesia Quick Evaluation

## 2022-09-27 NOTE — Transfer of Care (Signed)
Immediate Anesthesia Transfer of Care Note  Patient: Carolyn Silva  Procedure(s) Performed: CARDIOVERSION  Patient Location: PACU  Anesthesia Type:General  Level of Consciousness: awake and patient cooperative  Airway & Oxygen Therapy: Patient Spontanous Breathing and Patient connected to nasal cannula oxygen  Post-op Assessment: Report given to RN and Post -op Vital signs reviewed and stable  Post vital signs: Reviewed and stable  Last Vitals:  Vitals Value Taken Time  BP 147/133 1045  09/27/22  Temp 98.4 1045  09/27/22  Pulse 85 1045  06/27/22  Resp 16 1045  09/27/22   SpO2 100 1045  09/27/22    Last Pain:  Vitals:   09/27/22 0854  TempSrc: Oral  PainSc: 0-No pain         Complications: No notable events documented.

## 2022-09-27 NOTE — CV Procedure (Signed)
CV Procedure Note  Procedure: Electrical cardioversion Physician: Dr Dina Rich MD Indication: Persistent afib   Patient was brought to the procedure suite after appropriate consent was obtained. I confirmed with patient she has not missed any doses of eliquis within the last 3 weeks. Defib pads placed in the anterior and posterior positions. Sedation was achieved with the assistance of anesthesiology, for details please refer to their documentation. Succesfully converted to NSR in the 80s with a single synchronized 200j shock. Cardiopulmonary monitoring performed throughout the procedure, she tolerated well without complications.  Dina Rich MD

## 2022-09-27 NOTE — Progress Notes (Signed)
Electrical Cardioversion Procedure Note GLADIES UNGAR [id number] 04/09/1950  Procedure: Electrical Cardioversion Indications:  Atrial Fibrillation  Procedure Details Consent: Risks of procedure as well as the alternatives and risks of each were explained to the (patient/caregiver).  Consent for procedure obtained. Time Out: Verified patient identification, verified procedure, site/side was marked, verified correct patient position, special equipment/implants available, medications/allergies/relevent history reviewed, required imaging and test results available.  Performed  Patient placed on cardiac monitor, pulse oximetry, supplemental oxygen as necessary.  Sedation given: Benzodiazepines Pacer pads placed anterior and posterior chest.  Cardioverted 1 time(s).  Cardioverted at 200J.  Evaluation Findings: Post procedure EKG shows: NSR Complications: None Patient did tolerate procedure well.   Ramiro Harvest 09/27/2022, 11:23 AM

## 2022-09-27 NOTE — Anesthesia Postprocedure Evaluation (Signed)
Anesthesia Post Note  Patient: Carolyn Silva  Procedure(s) Performed: CARDIOVERSION  Patient location during evaluation: Phase II Anesthesia Type: General Level of consciousness: awake and alert and oriented Pain management: pain level controlled Vital Signs Assessment: post-procedure vital signs reviewed and stable Respiratory status: spontaneous breathing, nonlabored ventilation and respiratory function stable Cardiovascular status: blood pressure returned to baseline and stable Postop Assessment: no apparent nausea or vomiting Anesthetic complications: no  No notable events documented.   Last Vitals:  Vitals:   09/27/22 1130 09/27/22 1143  BP: 138/83 128/68  Pulse: 75   Resp: 17 20  Temp:    SpO2: 100% 100%    Last Pain:  Vitals:   09/27/22 1143  TempSrc:   PainSc: 0-No pain                  C 

## 2022-09-27 NOTE — H&P (Signed)
Procedure H&P  Patient presents for outpatient elective electrical cardioversion in the setting of persistent afib. She is on eliquis for stroke prevention, she is self rate controlled. Will plan for cardioversion today with the assistance of anestheiology. For full history please refer to referenced clinic note below   Cardiology Office Note     Date:  09/21/2022  ID:  Carolyn Silva, DOB [birth year], MRN [id number] Cardiologist: Nona Dell, MD     History of Present Illness:     Carolyn Silva is a 72 y.o. female  with past medical history of persistent atrial fibrillation/flutter (s/p DCCVx2 in 06/2019), nocturnal pauses (occurred prior to CPAP use), OSA, HTN and asthma who presents to the office today to discuss cardioversion.   She was examined by myself in 07/2022 and had recently been diagnosed with recurrent atrial fibrillation with RVR and Dr. Diona Browner had recommended a DCCV if this was persistent. At the time of her visit, she reported feeling well and denied any recent chest pain or palpitations. She had not been on AV nodal blocking agents given her history of nocturnal pauses and the options of a DCCV were reviewed and she wished to proceed with this.   In the interim, she was admitted to [facility] from 7/3 - 08/21/2022 for evaluation of worsening dyspnea and coughing for the past 2 days. She was found to have community-acquired pneumonia and treated with antibiotics and steroids. Was also found to have worsening anemia as hemoglobin had previously been at 8.9 and had declined to 8.1 on admission and as low as 7.3 on 08/19/2022. GI was consulted and was recommended to transfuse 1 unit pRBC's and to consider colonoscopy or EGD as an inpatient or outpatient. She was discharged home as she wished to have this as an outpatient. Ferritin was low at 7 but it does not appear she was started on iron supplementation.   In talking with the patient today, she reports "feeling great" since her  recent hospitalization. Says that her breathing has significantly improved and she denies any dyspnea on exertion, orthopnea or PND. She does use a CPAP at night. No recent chest pain or palpitations. She did have follow-up labs by her PCP on 08/29/2022 and her hemoglobin had improved to 10.1. She denies any melena, hematochezia or hematuria.  Reports she did not hear back from GI in regards to arranging a follow-up visit. Today is her 72st birthday!   Studies Reviewed:    EKG: EKG is ordered today and demonstrates:     EKG Interpretation Date/Time:                      Wednesday September 21 2022 15:24:36 EDT Ventricular Rate:   82 PR Interval:                        QRS Duration:                    82 QT Interval:                      382 QTC Calculation:446 R Axis:                         16   Text Interpretation:Atrial fibrillation No acute changes Confirmed by [name] ([id number]) on 09/21/2022 3:28:52 PM           Echocardiogram: 08/2022 IMPRESSIONS  1. Left ventricular ejection fraction, by estimation, is 60 to 65%. The  left ventricle has normal function. The left ventricle has no regional  wall motion abnormalities. There is mild left ventricular hypertrophy.  Left ventricular diastolic parameters  are indeterminate.   2. Right ventricular systolic function is normal. The right ventricular  size is normal.   3. Left atrial size was moderately dilated.   4. Right atrial size was mildly dilated.   5. The mitral valve is normal in structure. No evidence of mitral valve  regurgitation. No evidence of mitral stenosis.   6. The aortic valve is tricuspid. There is mild calcification of the  aortic valve. There is mild thickening of the aortic valve. Aortic valve  regurgitation is not visualized. Aortic valve sclerosis is present, with  no evidence of aortic valve stenosis.   7. The inferior vena cava is normal in size with greater than 50%  respiratory variability,  suggesting right atrial pressure of 3 mmHg.      Risk Assessment/Calculations:      CHA2DS2-VASc Score = 3   This indicates a 3.2% annual risk of stroke. The patient's score is based upon: CHF History: 0 HTN History: 1 Diabetes History: 0 Stroke History: 0 Vascular Disease History: 0 Age Score: 1 Gender Score: 1     Physical Exam:    VS:  BP 122/72   Pulse 82   Ht 5\' 5"  (1.651 m)   Wt 260 lb (117.9 kg)   SpO2 97%   BMI 43.27 kg/m       Wt Readings from Last 3 Encounters:  09/21/22 260 lb (117.9 kg)  08/17/22 263 lb 0.1 oz (119.3 kg)  07/26/22 254 lb 3.2 oz (115.3 kg)      GEN: Well nourished, well developed female appearing in no acute distress NECK: No JVD; No carotid bruits CARDIAC: Irregularly irregular, no murmurs, rubs, gallops RESPIRATORY:  Clear to auscultation without rales, wheezing or rhonchi  ABDOMEN: Appears non-distended. No obvious abdominal masses. EXTREMITIES: No clubbing or cyanosis. Trace lower extremity edema.  Distal pedal pulses are 2+ bilaterally.     Assessment and Plan:    1. Persistent Atrial Fibrillation/Flutter - She denies any recent palpitations and her heart rate is well-controlled in the 80's during today's visit. She has not been on AV nodal blocking agents given prior nocturnal bradycardia. Given that her hemoglobin had improved to 10.1 by recent labs and no reports of active bleeding, we again reviewed a DCCV and she is in agreement for proceeding with this. If this is scheduled for after 09/29/2022, she will need a repeat CBC and BMET. Labs by review of [facility] DXA were obtained on 08/29/2022 and showed Na+ 143, K+ 4.7, hemoglobin 10.1 and platelets 270 K.  - Continue Eliquis 5 mg twice daily for anticoagulation which is the appropriate dose at this time given her age, weight and renal function.   Shared Decision Making/Informed Consent{ The risks (stroke, cardiac arrhythmias rarely resulting in the need for a temporary or permanent  pacemaker, skin irritation or burns and complications associated with conscious sedation including aspiration, arrhythmia, respiratory failure and death), benefits (restoration of normal sinus rhythm) and alternatives of a direct current cardioversion were explained in detail to Ms. Larmer and she agrees to proceed.    2. HTN - BP is well-controlled at 122/72 during today's visit. Continue current medical therapy with Losartan 100 mg daily.   3. OSA - Continued compliance with CPAP encouraged.   4. Anemia -  She did have follow-up labs by her PCP on 08/29/2022 and hemoglobin had improved to 10.1 with platelets at 270K.  She denies any evidence of active bleeding as discussed above. Given that her ferritin was low at 7 during her recent admission, I did recommend that she start iron supplementation. Will also send a message to GI today to help with arranging follow-up.     Signed, Ellsworth Lennox, PA-C

## 2022-09-27 NOTE — Progress Notes (Signed)
EKG done

## 2022-09-27 NOTE — Addendum Note (Signed)
Addendum  created 09/27/22 1159 by Franco Nones, CRNA   Flowsheet accepted

## 2022-09-27 NOTE — Anesthesia Procedure Notes (Signed)
Date/Time: 09/27/2022 10:22 AM  Performed by: Franco Nones, CRNAPre-anesthesia Checklist: Patient identified, Emergency Drugs available, Suction available, Timeout performed and Patient being monitored Patient Re-evaluated:Patient Re-evaluated prior to induction Oxygen Delivery Method: Nasal Cannula

## 2022-09-28 ENCOUNTER — Encounter (HOSPITAL_COMMUNITY): Payer: Self-pay | Admitting: Cardiology

## 2022-10-12 ENCOUNTER — Ambulatory Visit: Payer: 59 | Admitting: Gastroenterology

## 2022-10-21 DIAGNOSIS — J449 Chronic obstructive pulmonary disease, unspecified: Secondary | ICD-10-CM | POA: Diagnosis not present

## 2022-10-24 DIAGNOSIS — J9600 Acute respiratory failure, unspecified whether with hypoxia or hypercapnia: Secondary | ICD-10-CM | POA: Diagnosis not present

## 2022-11-20 DIAGNOSIS — J449 Chronic obstructive pulmonary disease, unspecified: Secondary | ICD-10-CM | POA: Diagnosis not present

## 2022-11-21 ENCOUNTER — Other Ambulatory Visit: Payer: Self-pay | Admitting: Medical

## 2022-11-23 DIAGNOSIS — J9600 Acute respiratory failure, unspecified whether with hypoxia or hypercapnia: Secondary | ICD-10-CM | POA: Diagnosis not present

## 2022-11-25 ENCOUNTER — Ambulatory Visit: Payer: 59 | Attending: Student | Admitting: Student

## 2022-11-25 ENCOUNTER — Encounter: Payer: Self-pay | Admitting: Student

## 2022-11-25 VITALS — BP 136/78 | HR 66 | Ht 64.0 in | Wt 258.0 lb

## 2022-11-25 DIAGNOSIS — Z7901 Long term (current) use of anticoagulants: Secondary | ICD-10-CM | POA: Diagnosis not present

## 2022-11-25 DIAGNOSIS — D509 Iron deficiency anemia, unspecified: Secondary | ICD-10-CM

## 2022-11-25 DIAGNOSIS — I4819 Other persistent atrial fibrillation: Secondary | ICD-10-CM | POA: Diagnosis not present

## 2022-11-25 DIAGNOSIS — G4733 Obstructive sleep apnea (adult) (pediatric): Secondary | ICD-10-CM

## 2022-11-25 DIAGNOSIS — I10 Essential (primary) hypertension: Secondary | ICD-10-CM | POA: Diagnosis not present

## 2022-11-25 NOTE — Patient Instructions (Signed)
Medication Instructions:  Your physician recommends that you continue on your current medications as directed. Please refer to the Current Medication list given to you today.   Labwork: None today  Testing/Procedures: None today  Follow-Up: 6 months  Any Other Special Instructions Will Be Listed Below (If Applicable).  If you need a refill on your cardiac medications before your next appointment, please call your pharmacy.  

## 2022-11-25 NOTE — Progress Notes (Signed)
Cardiology Office Note    Date:  11/25/2022  ID:  Carolyn Silva, DOB 11/26/1950, MRN [id number] Cardiologist: Nona Dell, MD    History of Present Illness:    Carolyn Silva is a 71 y.o. female with past medical history of persistent atrial fibrillation/flutter (s/p DCCVx2 in 06/2019), nocturnal pauses (occurred prior to CPAP use), OSA, HTN and asthma who presents to the office today for follow-up from her recent cardioversion.  She was examined by myself in 09/2022 and had recently been admitted for worsening dyspnea and coughing and was found to have community-acquired pneumonia. Was also anemic on admission and received 1 unit pRBC's. Ferritin was also low and she was started on iron supplementation. At the time of her follow-up visit, she reported "feeling great" and denied any recent symptoms. She was still in atrial fibrillation and given that her hemoglobin had improved to 10.1 by recent labs, DCCV was rescheduled as this had previously been postponed. She underwent DCCV by Dr. Wyline Mood on 09/27/2022 and converted to normal sinus rhythm with a single synchronized 200 J shock.  In talking with the patient today, she reports overall doing well since her recent cardioversion.  Reports her shortness of breath actually improved after this as she did not know her arrhythmia was the culprit. She denies any chest pain or palpitations. No specific orthopnea, PND or pitting edema. She does use her CPAP on a nightly basis. Reports only consuming 1 cup of coffee a day and no other caffeine intake. Reports occasional alcohol use a few times a month but no binge drinking. Remains on Eliquis for anticoagulation with no reports of melena, hematochezia or hematuria. She is scheduled for follow-up labs with her PCP later this month.  Studies Reviewed:   EKG: EKG is ordered today and demonstrates:   EKG Interpretation Date/Time:  Friday November 25 2022 12:57:32 EDT Ventricular Rate:  66 PR  Interval:  200 QRS Duration:  84 QT Interval:  446 QTC Calculation: 467 R Axis:   27  Text Interpretation: Normal sinus rhythm Septal infarct Confirmed by [name] ([id number]) on 11/25/2022 1:00:01 PM       Echocardiogram: 08/2022 IMPRESSIONS     1. Left ventricular ejection fraction, by estimation, is 60 to 65%. The  left ventricle has normal function. The left ventricle has no regional  wall motion abnormalities. There is mild left ventricular hypertrophy.  Left ventricular diastolic parameters  are indeterminate.   2. Right ventricular systolic function is normal. The right ventricular  size is normal.   3. Left atrial size was moderately dilated.   4. Right atrial size was mildly dilated.   5. The mitral valve is normal in structure. No evidence of mitral valve  regurgitation. No evidence of mitral stenosis.   6. The aortic valve is tricuspid. There is mild calcification of the  aortic valve. There is mild thickening of the aortic valve. Aortic valve  regurgitation is not visualized. Aortic valve sclerosis is present, with  no evidence of aortic valve stenosis.   7. The inferior vena cava is normal in size with greater than 50%  respiratory variability, suggesting right atrial pressure of 3 mmHg.   Risk Assessment/Calculations:    CHA2DS2-VASc Score = 3   This indicates a 3.2% annual risk of stroke. The patient's score is based upon: CHF History: 0 HTN History: 1 Diabetes History: 0 Stroke History: 0 Vascular Disease History: 0 Age Score: 1 Gender Score: 1     Physical Exam:  VS:  BP 136/78   Pulse 66   Ht 5\' 4"  (1.626 m)   Wt 258 lb (117 kg)   SpO2 97%   BMI 44.29 kg/m    Wt Readings from Last 3 Encounters:  11/25/22 258 lb (117 kg)  09/27/22 259 lb 14.8 oz (117.9 kg)  09/23/22 260 lb (117.9 kg)     GEN: Well nourished, well developed female appearing in no acute distress NECK: No JVD; No carotid bruits CARDIAC: RRR, no murmurs, rubs,  gallops RESPIRATORY:  Clear to auscultation without rales, wheezing or rhonchi  ABDOMEN: Appears non-distended. No obvious abdominal masses. EXTREMITIES: No clubbing or cyanosis. No pitting edema.  Distal pedal pulses are 2+ bilaterally.   Assessment and Plan:   1. Persistent Atrial Fibrillation and Flutter/Use of Long-term Anticoagulation - She is maintaining normal sinus rhythm following recent cardioversion and has not been on AV nodal blocking agents given prior nocturnal pauses by monitoring in 08/2019. - No reports of active bleeding. Continue Eliquis 5 mg twice daily for anticoagulation which is the appropriate dose given her age, weight and renal function.  2. HTN - BP was initially recorded at 158/70, rechecked and improved to 136/78. Continue current medical therapy with Losartan 100 mg daily.  3. OSA - Continued compliance with CPAP encouraged.   4. Anemia - Her hemoglobin had improved to 10.4 when checked on 09/23/2022. She denies any evidence of active bleeding and is scheduled for follow-up labs with her PCP later this moth. Remains on iron supplementation.   Signed, Ellsworth Lennox, PA-C

## 2022-12-06 ENCOUNTER — Other Ambulatory Visit (HOSPITAL_COMMUNITY): Payer: Self-pay | Admitting: Internal Medicine

## 2022-12-06 DIAGNOSIS — Z1231 Encounter for screening mammogram for malignant neoplasm of breast: Secondary | ICD-10-CM

## 2022-12-09 DIAGNOSIS — R7303 Prediabetes: Secondary | ICD-10-CM | POA: Diagnosis not present

## 2022-12-09 DIAGNOSIS — E785 Hyperlipidemia, unspecified: Secondary | ICD-10-CM | POA: Diagnosis not present

## 2022-12-09 DIAGNOSIS — R809 Proteinuria, unspecified: Secondary | ICD-10-CM | POA: Diagnosis not present

## 2022-12-15 DIAGNOSIS — D649 Anemia, unspecified: Secondary | ICD-10-CM | POA: Diagnosis not present

## 2022-12-15 DIAGNOSIS — R062 Wheezing: Secondary | ICD-10-CM | POA: Diagnosis not present

## 2022-12-15 DIAGNOSIS — E870 Hyperosmolality and hypernatremia: Secondary | ICD-10-CM | POA: Diagnosis not present

## 2022-12-15 DIAGNOSIS — I10 Essential (primary) hypertension: Secondary | ICD-10-CM | POA: Diagnosis not present

## 2022-12-15 DIAGNOSIS — I4891 Unspecified atrial fibrillation: Secondary | ICD-10-CM | POA: Diagnosis not present

## 2022-12-15 DIAGNOSIS — R809 Proteinuria, unspecified: Secondary | ICD-10-CM | POA: Diagnosis not present

## 2022-12-15 DIAGNOSIS — E785 Hyperlipidemia, unspecified: Secondary | ICD-10-CM | POA: Diagnosis not present

## 2022-12-15 DIAGNOSIS — R7303 Prediabetes: Secondary | ICD-10-CM | POA: Diagnosis not present

## 2022-12-15 DIAGNOSIS — K219 Gastro-esophageal reflux disease without esophagitis: Secondary | ICD-10-CM | POA: Diagnosis not present

## 2022-12-15 DIAGNOSIS — G4733 Obstructive sleep apnea (adult) (pediatric): Secondary | ICD-10-CM | POA: Diagnosis not present

## 2022-12-19 ENCOUNTER — Ambulatory Visit (HOSPITAL_COMMUNITY)
Admission: RE | Admit: 2022-12-19 | Discharge: 2022-12-19 | Disposition: A | Discharge status: Home / Self Care | Payer: 59 | Source: Ambulatory Visit | Attending: Internal Medicine | Admitting: Internal Medicine

## 2022-12-19 DIAGNOSIS — Z1231 Encounter for screening mammogram for malignant neoplasm of breast: Secondary | ICD-10-CM | POA: Diagnosis not present

## 2022-12-21 DIAGNOSIS — J449 Chronic obstructive pulmonary disease, unspecified: Secondary | ICD-10-CM | POA: Diagnosis not present

## 2022-12-24 DIAGNOSIS — J9600 Acute respiratory failure, unspecified whether with hypoxia or hypercapnia: Secondary | ICD-10-CM | POA: Diagnosis not present

## 2023-01-20 DIAGNOSIS — J449 Chronic obstructive pulmonary disease, unspecified: Secondary | ICD-10-CM | POA: Diagnosis not present

## 2023-01-23 DIAGNOSIS — J9600 Acute respiratory failure, unspecified whether with hypoxia or hypercapnia: Secondary | ICD-10-CM | POA: Diagnosis not present

## 2023-02-18 ENCOUNTER — Other Ambulatory Visit: Payer: Self-pay

## 2023-02-18 ENCOUNTER — Emergency Department (HOSPITAL_COMMUNITY)
Admission: EM | Admit: 2023-02-18 | Discharge: 2023-02-18 | Disposition: A | Discharge status: Home / Self Care | Payer: 59 | Attending: Emergency Medicine | Admitting: Emergency Medicine

## 2023-02-18 ENCOUNTER — Emergency Department (HOSPITAL_COMMUNITY): Payer: 59

## 2023-02-18 ENCOUNTER — Encounter (HOSPITAL_COMMUNITY): Payer: Self-pay | Admitting: *Deleted

## 2023-02-18 DIAGNOSIS — I517 Cardiomegaly: Secondary | ICD-10-CM | POA: Diagnosis not present

## 2023-02-18 DIAGNOSIS — D649 Anemia, unspecified: Secondary | ICD-10-CM | POA: Diagnosis not present

## 2023-02-18 DIAGNOSIS — J40 Bronchitis, not specified as acute or chronic: Secondary | ICD-10-CM | POA: Diagnosis not present

## 2023-02-18 DIAGNOSIS — Z7901 Long term (current) use of anticoagulants: Secondary | ICD-10-CM | POA: Insufficient documentation

## 2023-02-18 DIAGNOSIS — I10 Essential (primary) hypertension: Secondary | ICD-10-CM | POA: Insufficient documentation

## 2023-02-18 DIAGNOSIS — J984 Other disorders of lung: Secondary | ICD-10-CM | POA: Diagnosis not present

## 2023-02-18 DIAGNOSIS — J209 Acute bronchitis, unspecified: Secondary | ICD-10-CM | POA: Diagnosis not present

## 2023-02-18 DIAGNOSIS — I480 Paroxysmal atrial fibrillation: Secondary | ICD-10-CM | POA: Insufficient documentation

## 2023-02-18 DIAGNOSIS — R0602 Shortness of breath: Secondary | ICD-10-CM | POA: Diagnosis not present

## 2023-02-18 DIAGNOSIS — R2243 Localized swelling, mass and lump, lower limb, bilateral: Secondary | ICD-10-CM | POA: Diagnosis not present

## 2023-02-18 DIAGNOSIS — J9811 Atelectasis: Secondary | ICD-10-CM | POA: Diagnosis not present

## 2023-02-18 DIAGNOSIS — Z20822 Contact with and (suspected) exposure to covid-19: Secondary | ICD-10-CM | POA: Insufficient documentation

## 2023-02-18 DIAGNOSIS — J9801 Acute bronchospasm: Secondary | ICD-10-CM | POA: Diagnosis not present

## 2023-02-18 LAB — CBC
HCT: 25.7 % — ABNORMAL LOW (ref 36.0–46.0)
Hemoglobin: 7.4 g/dL — ABNORMAL LOW (ref 12.0–15.0)
MCH: 23.1 pg — ABNORMAL LOW (ref 26.0–34.0)
MCHC: 28.8 g/dL — ABNORMAL LOW (ref 30.0–36.0)
MCV: 80.3 fL (ref 80.0–100.0)
Platelets: 292 10*3/uL (ref 150–400)
RBC: 3.2 MIL/uL — ABNORMAL LOW (ref 3.87–5.11)
RDW: 16.4 % — ABNORMAL HIGH (ref 11.5–15.5)
WBC: 9.4 10*3/uL (ref 4.0–10.5)
nRBC: 0 % (ref 0.0–0.2)

## 2023-02-18 LAB — RESP PANEL BY RT-PCR (RSV, FLU A&B, COVID)  RVPGX2
Influenza A by PCR: NEGATIVE
Influenza B by PCR: NEGATIVE
Resp Syncytial Virus by PCR: NEGATIVE
SARS Coronavirus 2 by RT PCR: NEGATIVE

## 2023-02-18 LAB — BASIC METABOLIC PANEL
Anion gap: 8 (ref 5–15)
BUN: 9 mg/dL (ref 8–23)
CO2: 27 mmol/L (ref 22–32)
Calcium: 8.5 mg/dL — ABNORMAL LOW (ref 8.9–10.3)
Chloride: 104 mmol/L (ref 98–111)
Creatinine, Ser: 0.71 mg/dL (ref 0.44–1.00)
GFR, Estimated: >60 mL/min (ref >60)
Glucose, Bld: 95 mg/dL (ref 70–99)
Potassium: 3.3 mmol/L — ABNORMAL LOW (ref 3.5–5.1)
Sodium: 139 mmol/L (ref 135–145)

## 2023-02-18 LAB — POC OCCULT BLOOD, ED: Fecal Occult Bld: NEGATIVE

## 2023-02-18 LAB — MAGNESIUM: Magnesium: 2 mg/dL (ref 1.7–2.4)

## 2023-02-18 LAB — BRAIN NATRIURETIC PEPTIDE: B Natriuretic Peptide: 373 pg/mL — ABNORMAL HIGH (ref 0.0–100.0)

## 2023-02-18 MED ORDER — IPRATROPIUM-ALBUTEROL 0.5-2.5 (3) MG/3ML IN SOLN
3.0000 mL | Freq: Once | RESPIRATORY_TRACT | Status: AC
  Administered 2023-02-18: 3 mL via RESPIRATORY_TRACT
  Filled 2023-02-18: qty 3

## 2023-02-18 MED ORDER — PREDNISONE 50 MG PO TABS: 50.0000 mg | ORAL_TABLET | Freq: Every day | ORAL | 0 refills | Status: DC

## 2023-02-18 MED ORDER — IPRATROPIUM-ALBUTEROL 0.5-2.5 (3) MG/3ML IN SOLN
3.0000 mL | Freq: Once | RESPIRATORY_TRACT | Status: DC
  Filled 2023-02-18: qty 3

## 2023-02-18 MED ORDER — DILTIAZEM HCL 25 MG/5ML IV SOLN
20.0000 mg | Freq: Once | INTRAVENOUS | Status: AC
  Administered 2023-02-18: 20 mg via INTRAVENOUS
  Filled 2023-02-18: qty 5

## 2023-02-18 MED ORDER — ALBUTEROL SULFATE (2.5 MG/3ML) 0.083% IN NEBU
2.5000 mg | INHALATION_SOLUTION | Freq: Once | RESPIRATORY_TRACT | Status: DC
  Filled 2023-02-18: qty 3

## 2023-02-18 MED ORDER — BENZONATATE 100 MG PO CAPS: 100.0000 mg | ORAL_CAPSULE | Freq: Three times a day (TID) | ORAL | 0 refills | Status: DC

## 2023-02-18 MED ORDER — DOXYCYCLINE HYCLATE 100 MG PO TABS: 100.0000 mg | ORAL_TABLET | Freq: Two times a day (BID) | ORAL | 0 refills | Status: DC

## 2023-02-18 NOTE — ED Notes (Signed)
 Pt stated she "Ain't going nowhere with the way she feels." MD notified and gave verbal order for a douneb.

## 2023-02-18 NOTE — ED Provider Notes (Signed)
 [place] EMERGENCY DEPARTMENT AT [facility] Provider Note   CSN: [id number] Arrival date & time: 02/18/23  1129     History  Chief Complaint  Patient presents with   Shortness of Breath    Carolyn Silva is a 73 y.o. female.   Shortness of Breath    Patient has a history of asthma, hypertension, paroxysmal atrial fibrillation, obstructive sleep apnea.  She presents to the ED for evaluation of shortness of breath.  Patient states she started having symptoms several days ago.  She has been having trouble with cough, feeling feverish and congestion.  She has been breaking up mucus.  She has not noticed any significant leg swelling.  No chest pain  Home Medications Prior to Admission medications   Medication Sig Start Date End Date Taking? Authorizing Provider  benzonatate  (TESSALON ) 100 MG capsule Take 1 capsule (100 mg total) by mouth every 8 (eight) hours. 02/18/23  Yes Randol Simmonds, MD  doxycycline  (VIBRA -TABS) 100 MG tablet Take 1 tablet (100 mg total) by mouth 2 (two) times daily. 02/18/23  Yes Randol Simmonds, MD  predniSONE  (DELTASONE ) 50 MG tablet Take 1 tablet (50 mg total) by mouth daily. 02/18/23  Yes Randol Simmonds, MD  albuterol  (VENTOLIN  HFA) 108 [id number] Base) MCG/ACT inhaler Inhale 2 puffs into the lungs every 4 (four) hours as needed for wheezing or shortness of breath. 06/23/22   Johnson, Clanford L, MD  apixaban  (ELIQUIS ) 5 MG TABS tablet Take 1 tablet (5 mg total) by mouth 2 (two) times daily. 09/21/22   Johnson Laymon HERO, PA-C  ferrous sulfate  325 (65 FE) MG tablet Take 1 tablet (325 mg total) by mouth daily with breakfast. 09/21/22   Johnson, Laymon HERO, PA-C  ipratropium-albuterol  (DUONEB) 0.5-2.5 (3) MG/3ML SOLN Take 3 mLs by nebulization in the morning, at noon, and at bedtime. 06/23/22   Johnson, Clanford L, MD  losartan  (COZAAR ) 100 MG tablet TAKE 1 TABLET BY MOUTH ONCE  DAILY 11/23/22   Debera Jayson MATSU, MD  mometasone -formoterol  (DULERA ) 100-5 MCG/ACT AERO Inhale 2  puffs into the lungs in the morning and at bedtime. 03/29/22   Tobie Yetta HERO, MD  pantoprazole  (PROTONIX ) 40 MG tablet Take 1 tablet (40 mg total) by mouth daily. 08/22/22   Ricky Fines, MD      Allergies    Strawberry c [ascorbic acid]    Review of Systems   Review of Systems  Respiratory:  Positive for shortness of breath.     Physical Exam Updated Vital Signs BP (!) 139/99   Pulse 75   Temp 99.4 F (37.4 C) (Oral)   Resp (!) 22   Ht 1.626 m (5' 4)   Wt 117.9 kg   SpO2 98%   BMI 44.63 kg/m  Physical Exam Vitals and nursing note reviewed.  Constitutional:      Appearance: She is well-developed. She is not diaphoretic.  HENT:     Head: Normocephalic and atraumatic.     Right Ear: External ear normal.     Left Ear: External ear normal.  Eyes:     General: No scleral icterus.       Right eye: No discharge.        Left eye: No discharge.     Conjunctiva/sclera: Conjunctivae normal.  Neck:     Trachea: No tracheal deviation.  Cardiovascular:     Rate and Rhythm: Normal rate. Rhythm irregular.  Pulmonary:     Effort: Pulmonary effort is normal. No respiratory distress.  Breath sounds: No stridor. Rales present. No wheezing.  Abdominal:     General: Bowel sounds are normal. There is no distension.     Palpations: Abdomen is soft.     Tenderness: There is no abdominal tenderness. There is no guarding or rebound.  Musculoskeletal:        General: No tenderness or deformity.     Cervical back: Neck supple.     Right lower leg: Edema present.     Left lower leg: Edema present.     Comments: Mild edema noted bilateral lower extremities  Skin:    General: Skin is warm and dry.     Findings: No rash.  Neurological:     General: No focal deficit present.     Mental Status: She is alert.     Cranial Nerves: No cranial nerve deficit, dysarthria or facial asymmetry.     Sensory: No sensory deficit.     Motor: No abnormal muscle tone or seizure activity.      Coordination: Coordination normal.  Psychiatric:        Mood and Affect: Mood normal.     ED Results / Procedures / Treatments   Labs (all labs ordered are listed, but only abnormal results are displayed) Labs Reviewed  BRAIN NATRIURETIC PEPTIDE - Abnormal; Notable for the following components:      Result Value   B Natriuretic Peptide 373.0 (*)    All other components within normal limits  CBC - Abnormal; Notable for the following components:   RBC 3.20 (*)    Hemoglobin 7.4 (*)    HCT 25.7 (*)    MCH 23.1 (*)    MCHC 28.8 (*)    RDW 16.4 (*)    All other components within normal limits  BASIC METABOLIC PANEL - Abnormal; Notable for the following components:   Potassium 3.3 (*)    Calcium 8.5 (*)    All other components within normal limits  RESP PANEL BY RT-PCR (RSV, FLU A&B, COVID)  RVPGX2  MAGNESIUM   POC OCCULT BLOOD, ED    EKG EKG Interpretation Date/Time:  Saturday February 18 2023 11:46:30 EST Ventricular Rate:  96 PR Interval:    QRS Duration:  76 QT Interval:  348 QTC Calculation: 439 R Axis:   25  Text Interpretation: Atrial fibrillation Anterior infarct (cited on or before 27-Sep-2022) Abnormal ECG When compared with ECG of 25-Nov-2022 12:57, Atrial fibrillation has replaced Sinus rhythm Questionable change in initial forces of Anteroseptal leads Nonspecific T wave abnormality now evident in Lateral leads Confirmed by Randol Simmonds [phone]) on 02/18/2023 11:59:34 AM  Radiology DG Chest Portable 1 View Result Date: 02/18/2023 CLINICAL DATA:  Dyspnea, shortness of breath. EXAM: PORTABLE CHEST 1 VIEW COMPARISON:  Chest radiograph dated 08/17/2022. FINDINGS: The heart is enlarged. Vascular calcifications are seen in the aortic arch. There is mild left greater than right bibasilar atelectasis/airspace disease. A left pleural effusion may contribute. No pneumothorax. Degenerative changes are seen in the spine. IMPRESSION: Mild left greater than right bibasilar  atelectasis/airspace disease. A left pleural effusion may contribute. Electronically Signed   By: Norman Hopper M.D.   On: 02/18/2023 12:45    Procedures Procedures    Medications Ordered in ED Medications  diltiazem  (CARDIZEM ) injection 20 mg (20 mg Intravenous Given 02/18/23 1333)  ipratropium-albuterol  (DUONEB) 0.5-2.5 (3) MG/3ML nebulizer solution 3 mL (3 mLs Nebulization Given 02/18/23 1330)    ED Course/ Medical Decision Making/ A&P Clinical Course as of 02/18/23 1519  Sat  Feb 18, 2023  1348 Brain natriuretic peptide (order ONLY if patient c/o SOB)(!) BNP slightly increased compared to previous values.  metabolic panel normal [JK]  1348 CBC(!) Anemia noted, increased compared to previous values [JK]  1348 Chest x-ray shows atelectasis/airspace disease [JK]    Clinical Course User Index [JK] Randol Simmonds, MD                                 Medical Decision Making Frontal diagnosis includes but not limited to pneumonia, CHF, COPD, pneumonia, pneumothorax  Problems Addressed: Anemia, unspecified type: chronic illness or injury with exacerbation, progression, or side effects of treatment Bronchitis with bronchospasm: acute illness or injury that poses a threat to life or bodily functions  Amount and/or Complexity of Data Reviewed Labs: ordered. Decision-making details documented in ED Course. Radiology: ordered and independent interpretation performed.  Risk Prescription drug management.   Patient presented to the ED for evaluation of shortness of breath.  Chest x-ray shows bilateral atelectasis versus airspace disease.  Overall suspect this is related to atelectasis.  Patient had notable wheezing on exam.  She improved with a breathing treatment.  Doubt CHF based on her x-ray and laboratory findings.  No evidence of COVID or influenza.  Patient does have A-fib and she was mildly tachycardic initially but I do not feel that her atrial fibrillation was causing her breathing  symptoms.  Patient responded to treatment.  She is feeling better.  She does not have an oxygen  requirement.  Will discharge home with course of steroids antibiotics for bronchitis and close outpatient follow-up.  Patient noted to be more anemic than previously.  She is Hemoccult negative.  Will have her follow-up with her PCP to have that rechecked        Final Clinical Impression(s) / ED Diagnoses Final diagnoses:  Bronchitis with bronchospasm  Anemia, unspecified type    Rx / DC Orders ED Discharge Orders          Ordered    predniSONE  (DELTASONE ) 50 MG tablet  Daily        02/18/23 1517    doxycycline  (VIBRA -TABS) 100 MG tablet  2 times daily        02/18/23 1517    benzonatate  (TESSALON ) 100 MG capsule  Every 8 hours        02/18/23 1517              Randol Simmonds, MD 02/18/23 [id number]

## 2023-02-18 NOTE — ED Triage Notes (Signed)
 Pt with SOB since Tuesday, denies any CP.  Hx of Afib, currently on blood thinner per pt.

## 2023-02-18 NOTE — ED Notes (Signed)
 Unable to obtain iv access. [place] capable PMD notified.

## 2023-02-18 NOTE — ED Notes (Signed)
 While waiting for breathing treatment, pt removed own IV and left the building.

## 2023-02-18 NOTE — Discharge Instructions (Signed)
 Take the medications as prescribed to help with your wheezing and cough.  Follow-up with your doctor to make sure you are improving and to recheck your anemia

## 2023-02-20 DIAGNOSIS — J449 Chronic obstructive pulmonary disease, unspecified: Secondary | ICD-10-CM | POA: Diagnosis not present

## 2023-02-23 DIAGNOSIS — J9600 Acute respiratory failure, unspecified whether with hypoxia or hypercapnia: Secondary | ICD-10-CM | POA: Diagnosis not present

## 2023-02-24 ENCOUNTER — Telehealth: Payer: Self-pay

## 2023-02-24 NOTE — Progress Notes (Signed)
 Transition Care Management Unsuccessful Follow-up Telephone Call  Date of discharge and from where:  02/18/2023 [facility]  Attempts:  1st Attempt  Reason for unsuccessful TCM follow-up call:  No answer/busy  Carolyn Silva [facility]  [facility], [facility] Resource Care Guide Direct Dial: [phone]  Website: [email or web address]

## 2023-02-27 ENCOUNTER — Telehealth: Payer: Self-pay

## 2023-02-27 NOTE — Progress Notes (Signed)
 Transition Care Management Unsuccessful Follow-up Telephone Call  Date of discharge and from where:  02/18/2023 [facility]  Attempts:  2nd Attempt  Reason for unsuccessful TCM follow-up call:  No answer/busy  Carolyn Silva [facility]  [facility], [facility] Resource Care Guide Direct Dial: [phone]  Website: [email or web address]

## 2023-03-02 DIAGNOSIS — J45909 Unspecified asthma, uncomplicated: Secondary | ICD-10-CM | POA: Diagnosis not present

## 2023-03-02 DIAGNOSIS — Z79899 Other long term (current) drug therapy: Secondary | ICD-10-CM | POA: Diagnosis not present

## 2023-03-02 DIAGNOSIS — D649 Anemia, unspecified: Secondary | ICD-10-CM | POA: Diagnosis not present

## 2023-03-02 DIAGNOSIS — Z7689 Persons encountering health services in other specified circumstances: Secondary | ICD-10-CM | POA: Diagnosis not present

## 2023-03-02 DIAGNOSIS — K219 Gastro-esophageal reflux disease without esophagitis: Secondary | ICD-10-CM | POA: Diagnosis not present

## 2023-03-02 DIAGNOSIS — R7989 Other specified abnormal findings of blood chemistry: Secondary | ICD-10-CM | POA: Diagnosis not present

## 2023-03-09 ENCOUNTER — Telehealth: Payer: Self-pay

## 2023-03-09 ENCOUNTER — Other Ambulatory Visit: Payer: Self-pay

## 2023-03-09 DIAGNOSIS — R790 Abnormal level of blood mineral: Secondary | ICD-10-CM | POA: Insufficient documentation

## 2023-03-09 NOTE — Telephone Encounter (Addendum)
Auth Submission: NO AUTH NEEDED Site of care: Site of care: [facility] Payer: [employer] medicare Medication & CPT/J Code(s) submitted: Feraheme (ferumoxytol) [id number] Route of submission (phone, fax, portal): portal Phone # Fax # Auth type: Buy/Bill PB Units/visits requested: 510mg , x2doses Reference number: [id number] Approval from: 03/09/23 to 04/14/23

## 2023-03-17 ENCOUNTER — Ambulatory Visit: Payer: 59

## 2023-03-23 DIAGNOSIS — J449 Chronic obstructive pulmonary disease, unspecified: Secondary | ICD-10-CM | POA: Diagnosis not present

## 2023-03-26 DIAGNOSIS — J9600 Acute respiratory failure, unspecified whether with hypoxia or hypercapnia: Secondary | ICD-10-CM | POA: Diagnosis not present

## 2023-03-28 ENCOUNTER — Encounter: Payer: 59 | Attending: Nurse Practitioner | Admitting: Internal Medicine

## 2023-03-28 VITALS — BP 148/76 | HR 83 | Temp 98.3°F | Resp 16

## 2023-03-28 DIAGNOSIS — R790 Abnormal level of blood mineral: Secondary | ICD-10-CM | POA: Diagnosis not present

## 2023-03-28 MED ORDER — ACETAMINOPHEN 325 MG PO TABS
650.0000 mg | ORAL_TABLET | Freq: Once | ORAL | Status: AC
  Administered 2023-03-28: 650 mg via ORAL

## 2023-03-28 MED ORDER — SODIUM CHLORIDE 0.9 % IV SOLN
510.0000 mg | Freq: Once | INTRAVENOUS | Status: AC
  Administered 2023-03-28: 510 mg via INTRAVENOUS
  Filled 2023-03-28: qty 17

## 2023-03-28 MED ORDER — DIPHENHYDRAMINE HCL 25 MG PO CAPS
25.0000 mg | ORAL_CAPSULE | Freq: Once | ORAL | Status: AC
  Administered 2023-03-28: 25 mg via ORAL

## 2023-03-28 NOTE — Progress Notes (Signed)
Diagnosis: Iron Deficiency Anemia  Provider:   Micael Hampshire, FNP  Procedure: IV Infusion  IV Type: Peripheral, IV Location: L Hand  Feraheme (Ferumoxytol), Dose: 510 mg  Infusion Start Time: 0950  Infusion Stop Time: 1006  Post Infusion IV Care: Observation period completed  Discharge: Condition: Good, Destination: Home . AVS Provided  Performed by:  Cleotilde Neer, LPN

## 2023-04-04 ENCOUNTER — Encounter: Payer: 59 | Admitting: Emergency Medicine

## 2023-04-04 VITALS — BP 146/80 | HR 76 | Temp 98.4°F | Resp 20

## 2023-04-04 DIAGNOSIS — R790 Abnormal level of blood mineral: Secondary | ICD-10-CM

## 2023-04-04 MED ORDER — ACETAMINOPHEN 325 MG PO TABS
650.0000 mg | ORAL_TABLET | Freq: Once | ORAL | Status: AC
  Administered 2023-04-04: 650 mg via ORAL

## 2023-04-04 MED ORDER — SODIUM CHLORIDE 0.9 % IV SOLN
510.0000 mg | Freq: Once | INTRAVENOUS | Status: AC
  Administered 2023-04-04: 510 mg via INTRAVENOUS
  Filled 2023-04-04: qty 17

## 2023-04-04 MED ORDER — DIPHENHYDRAMINE HCL 25 MG PO CAPS
25.0000 mg | ORAL_CAPSULE | Freq: Once | ORAL | Status: AC
Start: 2023-04-04 — End: 2023-04-04
  Administered 2023-04-04: 25 mg via ORAL

## 2023-04-04 NOTE — Progress Notes (Signed)
Diagnosis: Iron Deficiency Anemia  Provider:   Micael Hampshire FNP  Procedure: IV Infusion  IV Type: Peripheral, IV Location: L Hand  Feraheme (Ferumoxytol), Dose: 510 mg  Infusion Start Time: 0957  Infusion Stop Time: 1014  Post Infusion IV Care: Observation period completed and Peripheral IV Discontinued  Discharge: Condition: Good, Destination: Home . AVS Provided  Performed by:  Arrie Senate, RN

## 2023-04-20 DIAGNOSIS — J449 Chronic obstructive pulmonary disease, unspecified: Secondary | ICD-10-CM | POA: Diagnosis not present

## 2023-04-23 DIAGNOSIS — J9600 Acute respiratory failure, unspecified whether with hypoxia or hypercapnia: Secondary | ICD-10-CM | POA: Diagnosis not present

## 2023-04-24 DIAGNOSIS — H25813 Combined forms of age-related cataract, bilateral: Secondary | ICD-10-CM | POA: Diagnosis not present

## 2023-05-18 ENCOUNTER — Encounter: Payer: Self-pay | Admitting: *Deleted

## 2023-05-24 ENCOUNTER — Ambulatory Visit: Payer: 59 | Attending: Student | Admitting: Student

## 2023-05-24 DIAGNOSIS — J9600 Acute respiratory failure, unspecified whether with hypoxia or hypercapnia: Secondary | ICD-10-CM | POA: Diagnosis not present

## 2023-05-24 NOTE — Progress Notes (Deleted)
 Cardiology Office Note    Date:  05/24/2023  ID:  CEYLIN DREIBELBIS, DOB December 02, 1950, MRN [id number] Cardiologist: Nona Dell, MD    History of Present Illness:    Carolyn Silva is a 73 y.o. female with past medical history of persistent atrial fibrillation/flutter (s/p DCCVx2 in 06/2019, recurrent atrial fibrillation 09/2022 in the setting of CAP and underwent DCCV in 09/2022), nocturnal pauses (occurred prior to CPAP use and previously evaluated by EP with no additional workup at that time), OSA, HTN and asthma who presents to the office today for 81-month follow-up.  She was examined by myself in 11/2022 following her cardioversion she had experienced improvement in her respiratory status given return to normal sinus rhythm.  No changes were made to her cardiac medications and she was continued on losartan 100 mg daily and Eliquis 5 mg twice daily for anticoagulation.  She was no longer on AV nodal blocking agents given prior nocturnal pauses.  In the interim, she was evaluated in the ED in 02/2023 for shortness of breath and treated for bronchitis with bronchospasm.  EKG at that time showed rate controlled atrial fibrillation, heart rate 96 and no changes were made in her cardiac medications.  ROS: ***  Studies Reviewed:   EKG: EKG is*** ordered today and demonstrates ***   EKG Interpretation Date/Time:    Ventricular Rate:    PR Interval:    QRS Duration:    QT Interval:    QTC Calculation:   R Axis:      Text Interpretation:         Echocardiogram: 08/2022 IMPRESSIONS     1. Left ventricular ejection fraction, by estimation, is 60 to 65%. The  left ventricle has normal function. The left ventricle has no regional  wall motion abnormalities. There is mild left ventricular hypertrophy.  Left ventricular diastolic parameters  are indeterminate.   2. Right ventricular systolic function is normal. The right ventricular  size is normal.   3. Left atrial size was  moderately dilated.   4. Right atrial size was mildly dilated.   5. The mitral valve is normal in structure. No evidence of mitral valve  regurgitation. No evidence of mitral stenosis.   6. The aortic valve is tricuspid. There is mild calcification of the  aortic valve. There is mild thickening of the aortic valve. Aortic valve  regurgitation is not visualized. Aortic valve sclerosis is present, with  no evidence of aortic valve stenosis.   7. The inferior vena cava is normal in size with greater than 50%  respiratory variability, suggesting right atrial pressure of 3 mmHg.    Risk Assessment/Calculations:   {Does this patient have ATRIAL FIBRILLATION?:[phone]} No BP recorded.  {Refresh Note OR Click here to enter BP  :1}***         Physical Exam:   VS:  There were no vitals taken for this visit.   Wt Readings from Last 3 Encounters:  02/18/23 260 lb (117.9 kg)  11/25/22 258 lb (117 kg)  09/27/22 259 lb 14.8 oz (117.9 kg)     GEN: Well nourished, well developed in no acute distress NECK: No JVD; No carotid bruits CARDIAC: ***RRR, no murmurs, rubs, gallops RESPIRATORY:  Clear to auscultation without rales, wheezing or rhonchi  ABDOMEN: Appears non-distended. No obvious abdominal masses. EXTREMITIES: No clubbing or cyanosis. No edema.  Distal pedal pulses are 2+ bilaterally.   Assessment and Plan:   1. Paroxysmal Atrial Fibrillation/Use of Long-term Anticoagulation - ***  2. Sinus Pauses - Prior monitor in 08/2019 showed nocturnal pauses up to 7.4 seconds and she was previously evaluated at EP was sleep study recommended and she is now on CPAP. ***  3. OSA - ***  4. HTN - ***   Signed, Ellsworth Lennox, PA-C

## 2023-05-29 NOTE — Progress Notes (Unsigned)
 GI Office Note    Referring Provider: Benita Stabile, MD Primary Care Physician:  Benita Stabile, MD Primary Gastroenterologist: Hennie Duos. Marletta Lor, DO  Date:  05/30/2023  ID:  Carolyn Silva, DOB 09/28/1950, MRN [id number]   Chief Complaint   Chief Complaint  Patient presents with   Anemia    Anemia    History of Present Illness  Carolyn Silva is a 73 y.o. female with a history of asthma, A-fib on Eliquis s/p cardioversion, HTN, OSA presenting today for evaluation of anemia need to schedule EGD and colonoscopy.   Colonoscopy 09/14/2017: - Four 2 to 4 mm polyps in the rectum, at the hepatic flexure and in the ascending colon  - Three 5 to 15 mm polyps in the transverse colon and in the ascending colon.  - 5 mm polyp in the ascending colon.  - MODERATE Diverticulosis in the LEFT colon.  - Internal hemorrhoids. - Path: tubular adenomas - Advised repeat in 3 years.  No prior EGD on file.  Last seen inpatient July 2024.  Presented with labs of hemoglobin 7.3, iron 13, saturation 3%, and ferritin 7 with normal B12 and folate.  Was on anticoagulation for A-fib and was scheduled to undergo cardioversion upcoming but procedure was canceled given hospitalization.  She received a unit of blood and her Eliquis was held.  She was advised to follow-up outpatient for EGD and colonoscopy for further evaluation of anemia after pursuing cardioversion and the rest of her cardiac workup and treatment.  Her Eliquis was resumed.  She stated to Dr. Jena Gauss that she did not want inpatient GI evaluation therefore she was discharged home and advised to follow-up with cardiology and eventually proceed back to GI for evaluation.     Latest Ref Rng & Units 02/18/2023   12:37 PM 09/23/2022    8:26 AM 08/21/2022    3:57 AM  CBC  WBC 4.0 - 10.5 K/uL 9.4  6.3  8.7   Hemoglobin 12.0 - 15.0 g/dL 7.4  56.2  8.2   Hematocrit 36.0 - 46.0 % 25.7  34.4  28.4   Platelets 150 - 400 K/uL 292  239  269     Iron/TIBC/Ferritin/ %Sat    Component Value Date/Time   IRON 13 (L) 08/19/2022 0426   TIBC 435 08/19/2022 0426   FERRITIN 7 (L) 08/19/2022 0426   IRONPCTSAT 3 (L) 08/19/2022 0426   Per review of chart she ultimately did undergo cardioversion in August 2024.  Last office visit with cardiology dated 11/25/2022.,  Being compliant with CPAP.  Improvement in BP.  She was maintaining normal sinus rhythm following cardioversion without being on AV nodal blocking agent.  She was advised to continue Eliquis 5 mg twice  daily.  Received repeat referral for GI evaluation for every 3-year screening colonoscopy and now with new anemia.  Her last visit dated 03/02/2023 she presented for an ED follow-up where she was prescribed doxycycline and prednisone for upper respiratory symptoms.  She reportedly had been to the ED for shortness of breath.  She reportedly was anemic at that time with hemoglobin 7.4 but with negative fecal occult testing.  Given need for surveillance colonoscopy and her anemia, referral was placed.  She was advised to take iron with vitamin C at home daily..  Noted to be using home remedies such as baking soda for treatment of reflux.  She was advised to use famotidine twice a day as needed for reflux.  Today:  Started tacking  iron otc for about 2 months. No melena or brpbr. Denies chest pain or shortness of breath. She has had some increase here recently due to pollen - sometimes once a day sometimes twice. Not effecting activities of daily living. Did have 2 iron infusions in February. Did have fatigue prior to all this. Significantly improved now.   Denies nausea, vomiting, dysphagia, lack of appetite, early satiety.  Does have acid reflux and takes this twice daily. This controls symptoms well.   Stopped drinking long time ago,quit 2015. May drink one drink at holidays.  Takes aleve once daily for while. She has been taking this for pain and itching. Sometimes when taking her  medications she itches and sometimes does not.   No bleeding episodes since being on Eliquis.  Denies constipation or diarrhea. Sometimes goes twice daily.  Wt Readings from Last 3 Encounters:  05/30/23 256 lb 3.2 oz (116.2 kg)  02/18/23 260 lb (117.9 kg)  11/25/22 258 lb (117 kg)    Current Outpatient Medications  Medication Sig Dispense Refill   albuterol (VENTOLIN HFA) 108 (90 Base) MCG/ACT inhaler Inhale 2 puffs into the lungs every 4 (four) hours as needed for wheezing or shortness of breath. 18 g 2   apixaban (ELIQUIS) 5 MG TABS tablet Take 1 tablet (5 mg total) by mouth 2 (two) times daily. 60 tablet 11   famotidine (PEPCID) 20 MG tablet Take 20 mg by mouth 2 (two) times daily as needed.     ferrous sulfate 325 (65 FE) MG tablet Take 1 tablet (325 mg total) by mouth daily with breakfast. 90 tablet 3   ipratropium-albuterol (DUONEB) 0.5-2.5 (3) MG/3ML SOLN Take 3 mLs by nebulization in the morning, at noon, and at bedtime. 360 mL 0   doxycycline (VIBRA-TABS) 100 MG tablet Take 1 tablet (100 mg total) by mouth 2 (two) times daily. (Patient not taking: Reported on 05/30/2023) 14 tablet 0   predniSONE (DELTASONE) 50 MG tablet Take 1 tablet (50 mg total) by mouth daily. (Patient not taking: Reported on 05/30/2023) 5 tablet 0   No current facility-administered medications for this visit.    Past Medical History:  Diagnosis Date   Asthma    Hypertension    OSA (obstructive sleep apnea)    Paroxysmal atrial fibrillation (HCC)    Paroxysmal atrial flutter [facility])     Past Surgical History:  Procedure Laterality Date   CARDIOVERSION N/A 07/11/2019   Procedure: CARDIOVERSION;  Surgeon: Gerard Knight, MD;  Location: [facility];  Service: Cardiovascular;  Laterality: N/A;   CARDIOVERSION N/A 09/27/2022   Procedure: CARDIOVERSION;  Surgeon: Laurann Pollock, MD;  Location: [facility];  Service: Endoscopy;  Laterality: N/A;   COLONOSCOPY N/A 09/14/2017   Procedure: COLONOSCOPY;  Surgeon:  Alyce Jubilee, MD;  Location: [facility] SU[facility];  Service: Endoscopy;  Laterality: N/A;  2:00   TEE WITHOUT CARDIOVERSION N/A 07/11/2019   Procedure: TRANSESOPHAGEAL ECHOCARDIOGRAM (TEE) WITH PROPOFOL;  Surgeon: Gerard Knight, MD;  Location: [facility];  Service: Cardiovascular;  Laterality: N/A;   TUBAL LIGATION      Family History  Problem Relation Age of Onset   Diabetes Mother    Cirrhosis Father    Diabetes Brother    Kidney disease Sister    Colon cancer Neg Hx    Colon polyps Neg Hx     Allergies as of 05/30/2023 - Review Complete 05/30/2023  Allergen Reaction Noted   Strawberry c [ascorbic acid] Hives 01/01/2011    Social  History   Socioeconomic History   Marital status: Single    Spouse name: Not on file   Number of children: Not on file   Years of education: Not on file   Highest education level: Not on file  Occupational History   Not on file  Tobacco Use   Smoking status: Never   Smokeless tobacco: Never  Vaping Use   Vaping status: Never Used  Substance and Sexual Activity   Alcohol use: Not Currently    Comment: Occasional   Drug use: No   Sexual activity: Not on file  Other Topics Concern   Not on file  Social History Narrative   Not on file   Social Drivers of Health   Financial Resource Strain: Not on file  Food Insecurity: No Food Insecurity (08/17/2022)   Hunger Vital Sign    Worried About Running Out of Food in the Last Year: Never true    Ran Out of Food in the Last Year: Never true  Transportation Needs: No Transportation Needs (08/17/2022)   PRAPARE - Administrator, Civil Service (Medical): No    Lack of Transportation (Non-Medical): No  Physical Activity: Not on file  Stress: Not on file  Social Connections: Not on file     Review of Systems   Gen: Denies fever, chills, anorexia. Denies fatigue, weakness, weight loss.  CV: Denies chest pain, palpitations, syncope, peripheral edema, and claudication. Resp: Denies dyspnea  at rest, cough, wheezing, coughing up blood, and pleurisy. GI: See HPI Derm: Denies rash, itching, dry skin Psych: Denies depression, anxiety, memory loss, confusion. No homicidal or suicidal ideation.  Heme: Denies bruising, bleeding, and enlarged lymph nodes.  Physical Exam   BP 137/83 (BP Location: Right Arm, Patient Position: Sitting, Cuff Size: Large)   Pulse 86   Temp 97.6 F (36.4 C) (Temporal)   Ht 5\' 5"  (1.651 m)   Wt 256 lb 3.2 oz (116.2 kg)   BMI 42.63 kg/m   General:   Alert and oriented. No distress noted. Pleasant and cooperative.  Head:  Normocephalic and atraumatic. Eyes:  Conjuctiva clear without scleral icterus. Mouth:  Oral mucosa pink and moist. Good dentition. No lesions. Lungs:  Clear to auscultation bilaterally. No wheezes, rales, or rhonchi. No distress.  Heart: Irregular, rate controlled Abdomen:  +BS, soft, non-tender and non-distended. No rebound or guarding. No HSM or masses noted. Rectal: deferred Msk:  Symmetrical without gross deformities. Normal posture. Extremities:  Without edema. Neurologic:  Alert and  oriented x4 Psych:  Alert and cooperative. Normal mood and affect.  Assessment  Carolyn Silva is a 73 y.o. female with a history of asthma, A-fib on Eliquis s/p cardioversion, HTN, OSA presenting today for evaluation of anemia need to schedule EGD and colonoscopy.  Iron deficiency anemia:  - Previous hospitalization in 2024 with anemia, iron 13, saturation 3%, ferritin 7 with hemoglobin 7.3 -Recommended EGD and colonoscopy with plan for outpatient given she was undergoing evaluation of A-fib and scheduled cardioversion - Last colonoscopy in August 2019, currently overdue for surveillance -No prior EGD -Currently taking Aleve almost daily.  Advised to stop this -Mild shortness of breath likely secondary to allergies as well as asthma - Currently without any overt signs of GI bleeding, no melena or BRBPR - Initially did well with rise in  hemoglobin post last hospitalization however in January had recurrence of anemia with hemoglobin down to 7.4 - Received IV iron infusion x 2 in February and has been  on oral iron daily as well.  Fatigue improved after cardioversion and iron infusions - Given no prior EGD, we will pursue this to evaluate for cause of anemia as well as update colonoscopy - Will continue daily iron for now.  Will hold off on PPI now given no overt bleeding.  GERD:  - Reflux well-controlled with famotidine 20 mg twice daily -Denies breakthrough symptoms currently -Possibly dietary and weight related, working toward weight loss -GERD diet reinforced -Symptoms also likely secondary to NSAID use, encouraged cessation -History of alcohol use, quit in 2015, now only social use for holidays  History of colon polyps:  - Last colonoscopy in 2019 with removal of 8 polyps in total, pathology revealed to be tubular adenomas.  Recommended 3-year repeat. - Currently overdue, is agreeable to schedule procedure. - No alarm symptoms present at this time.  PLAN   CBC, iron panel Continue famotidine GERD diet Continue iron daily Advised to stop NSAIDs Proceed with upper endoscopy and colonoscopy with propofol by Dr. Mordechai April in near future: the risks, benefits, and alternatives have been discussed with the patient in detail. The patient states understanding and desires to proceed. ASA 3 Clearance to hold Eliquis for 2 days prior Hold iron for 1 week Follow-up after procedures   Julian Obey, MSN, FNP-BC, AGACNP-BC [facility]

## 2023-05-30 ENCOUNTER — Telehealth: Payer: Self-pay | Admitting: *Deleted

## 2023-05-30 ENCOUNTER — Ambulatory Visit (INDEPENDENT_AMBULATORY_CARE_PROVIDER_SITE_OTHER): Admitting: Gastroenterology

## 2023-05-30 ENCOUNTER — Other Ambulatory Visit: Payer: Self-pay | Admitting: Student

## 2023-05-30 ENCOUNTER — Encounter: Payer: Self-pay | Admitting: Gastroenterology

## 2023-05-30 VITALS — BP 137/83 | HR 86 | Temp 97.6°F | Ht 65.0 in | Wt 256.2 lb

## 2023-05-30 DIAGNOSIS — K219 Gastro-esophageal reflux disease without esophagitis: Secondary | ICD-10-CM

## 2023-05-30 DIAGNOSIS — Z860101 Personal history of adenomatous and serrated colon polyps: Secondary | ICD-10-CM | POA: Diagnosis not present

## 2023-05-30 DIAGNOSIS — Z860100 Personal history of colon polyps, unspecified: Secondary | ICD-10-CM

## 2023-05-30 DIAGNOSIS — D509 Iron deficiency anemia, unspecified: Secondary | ICD-10-CM

## 2023-05-30 DIAGNOSIS — I4819 Other persistent atrial fibrillation: Secondary | ICD-10-CM

## 2023-05-30 NOTE — Telephone Encounter (Signed)
  Request for patient to stop medication prior to procedure or is needing cleareance  05/30/23  Carolyn Silva 02/24/1950  What type of surgery is being performed? COLONOSCOPY/EGD  When is surgery scheduled? TBD  What type of clearance is required (medical or pharmacy to hold medication or both? MEDICATION  Are there any medications that need to be held prior to surgery and how long? ELIQUIS X 2 DAYS PRIOR  Name of physician performing surgery?  Dr. Goble Last [facility] at [facility] Phone: [phone] Fax: [phone]  Anethesia type (none, local, MAC, general)? MAC

## 2023-05-30 NOTE — Patient Instructions (Addendum)
 We will get you scheduled for colonoscopy and upper endoscopy in the near future with Dr. Mordechai April.  We will reach out to cardiology to hold her Eliquis for 2 days prior.  Please continue taking iron once daily.  Please continue taking your famotidine 20 mg twice daily.  Follow a GERD diet:  Avoid fried, fatty, greasy, spicy, citrus foods. Avoid caffeine and carbonated beverages. Avoid chocolate. Try eating 4-6 small meals a day rather than 3 large meals. Do not eat within 3 hours of laying down. Prop head of bed up on wood or bricks to create a 6 inch incline.  Please have blood work completed at [facility].  We will call you with results once they have been received. Please allow 3-5 business days for review. 2 locations for [facility] in [place]:              1. [address], [place]              2. [address] [place]   I will see you in the office for follow-up after your procedures.  If any further recommendations are needed we will reach out to you after receiving your blood work.  It was a pleasure to see you today. I want to create trusting relationships with patients. If you receive a survey regarding your visit,  I greatly appreciate you taking time to fill this out on paper or through your MyChart. I value your feedback.  Julian Obey, MSN, FNP-BC, AGACNP-BC [facility]

## 2023-05-31 NOTE — Telephone Encounter (Signed)
 Patient with diagnosis of atrial fibrillation on Eliquis for anticoagulation.    What type of surgery is being performed? COLONOSCOPY/EGD   When is surgery scheduled? TBD   CHA2DS2-VASc Score = 3   This indicates a 3.2% annual risk of stroke. The patient's score is based upon: CHF History: 0 HTN History: 1 Diabetes History: 0 Stroke History: 0 Vascular Disease History: 0 Age Score: 1 Gender Score: 1  CrCl 133 Platelet count 292  Per office protocol, patient can hold Eliquis for 2 days prior to procedure.   Patient will not need bridging with Lovenox (enoxaparin) around procedure.  **This guidance is not considered finalized until pre-operative APP has relayed final recommendations.**

## 2023-05-31 NOTE — Telephone Encounter (Signed)
 Prescription refill request for Eliquis received. Indication:afib Last office visit:10/24 Scr:0.71  1/25 Age: 73 Weight:116.2  kg  Prescription refilled

## 2023-05-31 NOTE — Telephone Encounter (Signed)
   Patient Name: Carolyn Silva  DOB: 02-09-1951 MRN: [id number]  Primary Cardiologist: Teddie Favre, MD  Clinical pharmacists have reviewed the patient's past medical history, labs, and current medications as part of preoperative protocol coverage. The following recommendations have been made:   Per office protocol, patient can hold Eliquis for 2 days prior to procedure.   Patient will not need bridging with Lovenox (enoxaparin) around procedure.  I will route this recommendation to the requesting party via Epic fax function and remove from pre-op pool.  Please call with questions.  Francene Ing, Retha Cast, NP 05/31/2023, 10:57 AM

## 2023-06-01 DIAGNOSIS — D509 Iron deficiency anemia, unspecified: Secondary | ICD-10-CM | POA: Diagnosis not present

## 2023-06-01 NOTE — Telephone Encounter (Signed)
 LMOVM to call back to schedule TCS/EGD with Dr. Mordechai April, ASA 3 Stop eliquis x 2 days prior, hold iron x 1 week

## 2023-06-02 LAB — IRON,TIBC AND FERRITIN PANEL
Ferritin: 128 ng/mL (ref 15–150)
Iron Saturation: 25 % (ref 15–55)
Iron: 78 ug/dL (ref 27–139)
Total Iron Binding Capacity: 306 ug/dL (ref 250–450)
UIBC: 228 ug/dL (ref 118–369)

## 2023-06-02 LAB — CBC
Hematocrit: 42.6 % (ref 34.0–46.6)
Hemoglobin: 13.7 g/dL (ref 11.1–15.9)
MCH: 28.9 pg (ref 26.6–33.0)
MCHC: 32.2 g/dL (ref 31.5–35.7)
MCV: 90 fL (ref 79–97)
Platelets: 213 10*3/uL (ref 150–450)
RBC: 4.74 x10E6/uL (ref 3.77–5.28)
RDW: 17.2 % — ABNORMAL HIGH (ref 11.7–15.4)
WBC: 7.4 10*3/uL (ref 3.4–10.8)

## 2023-06-05 DIAGNOSIS — H25813 Combined forms of age-related cataract, bilateral: Secondary | ICD-10-CM | POA: Diagnosis not present

## 2023-06-12 NOTE — Telephone Encounter (Signed)
 LMTCB. Letter mailed ?

## 2023-06-13 ENCOUNTER — Encounter: Payer: Self-pay | Admitting: *Deleted

## 2023-06-14 DIAGNOSIS — R809 Proteinuria, unspecified: Secondary | ICD-10-CM | POA: Diagnosis not present

## 2023-06-14 DIAGNOSIS — R7303 Prediabetes: Secondary | ICD-10-CM | POA: Diagnosis not present

## 2023-06-14 DIAGNOSIS — E785 Hyperlipidemia, unspecified: Secondary | ICD-10-CM | POA: Diagnosis not present

## 2023-06-15 ENCOUNTER — Other Ambulatory Visit: Payer: Self-pay

## 2023-06-15 ENCOUNTER — Encounter (HOSPITAL_COMMUNITY): Payer: Self-pay

## 2023-06-15 ENCOUNTER — Emergency Department (HOSPITAL_COMMUNITY)

## 2023-06-15 ENCOUNTER — Emergency Department (HOSPITAL_COMMUNITY)
Admission: EM | Admit: 2023-06-15 | Discharge: 2023-06-15 | Disposition: A | Discharge status: Home / Self Care | Attending: Emergency Medicine | Admitting: Emergency Medicine

## 2023-06-15 DIAGNOSIS — R059 Cough, unspecified: Secondary | ICD-10-CM | POA: Diagnosis not present

## 2023-06-15 DIAGNOSIS — R062 Wheezing: Secondary | ICD-10-CM | POA: Diagnosis not present

## 2023-06-15 DIAGNOSIS — J45909 Unspecified asthma, uncomplicated: Secondary | ICD-10-CM | POA: Diagnosis not present

## 2023-06-15 DIAGNOSIS — Z7901 Long term (current) use of anticoagulants: Secondary | ICD-10-CM | POA: Insufficient documentation

## 2023-06-15 DIAGNOSIS — R0602 Shortness of breath: Secondary | ICD-10-CM | POA: Diagnosis not present

## 2023-06-15 DIAGNOSIS — I517 Cardiomegaly: Secondary | ICD-10-CM | POA: Diagnosis not present

## 2023-06-15 DIAGNOSIS — J45901 Unspecified asthma with (acute) exacerbation: Secondary | ICD-10-CM

## 2023-06-15 DIAGNOSIS — R0689 Other abnormalities of breathing: Secondary | ICD-10-CM | POA: Diagnosis not present

## 2023-06-15 LAB — CBC WITH DIFFERENTIAL/PLATELET
Abs Immature Granulocytes: 0.02 10*3/uL (ref 0.00–0.07)
Basophils Absolute: 0 10*3/uL (ref 0.0–0.1)
Basophils Relative: 0 %
Eosinophils Absolute: 0.5 10*3/uL (ref 0.0–0.5)
Eosinophils Relative: 6 %
HCT: 41.2 % (ref 36.0–46.0)
Hemoglobin: 13.3 g/dL (ref 12.0–15.0)
Immature Granulocytes: 0 %
Lymphocytes Relative: 13 %
Lymphs Abs: 1.2 10*3/uL (ref 0.7–4.0)
MCH: 29.4 pg (ref 26.0–34.0)
MCHC: 32.3 g/dL (ref 30.0–36.0)
MCV: 91.2 fL (ref 80.0–100.0)
Monocytes Absolute: 0.8 10*3/uL (ref 0.1–1.0)
Monocytes Relative: 8 %
Neutro Abs: 6.5 10*3/uL (ref 1.7–7.7)
Neutrophils Relative %: 73 %
Platelets: 205 10*3/uL (ref 150–400)
RBC: 4.52 MIL/uL (ref 3.87–5.11)
RDW: 15.7 % — ABNORMAL HIGH (ref 11.5–15.5)
WBC: 9 10*3/uL (ref 4.0–10.5)
nRBC: 0 % (ref 0.0–0.2)

## 2023-06-15 LAB — COMPREHENSIVE METABOLIC PANEL WITH GFR
ALT: 19 U/L (ref 0–44)
AST: 23 U/L (ref 15–41)
Albumin: 3.9 g/dL (ref 3.5–5.0)
Alkaline Phosphatase: 111 U/L (ref 38–126)
Anion gap: 11 (ref 5–15)
BUN: 9 mg/dL (ref 8–23)
CO2: 25 mmol/L (ref 22–32)
Calcium: 9.4 mg/dL (ref 8.9–10.3)
Chloride: 101 mmol/L (ref 98–111)
Creatinine, Ser: 0.62 mg/dL (ref 0.44–1.00)
GFR, Estimated: >60 mL/min (ref >60)
Glucose, Bld: 119 mg/dL — ABNORMAL HIGH (ref 70–99)
Potassium: 3.6 mmol/L (ref 3.5–5.1)
Sodium: 137 mmol/L (ref 135–145)
Total Bilirubin: 0.4 mg/dL (ref 0.0–1.2)
Total Protein: 7.6 g/dL (ref 6.5–8.1)

## 2023-06-15 LAB — RESP PANEL BY RT-PCR (RSV, FLU A&B, COVID)  RVPGX2
Influenza A by PCR: NEGATIVE
Influenza B by PCR: NEGATIVE
Resp Syncytial Virus by PCR: NEGATIVE
SARS Coronavirus 2 by RT PCR: NEGATIVE

## 2023-06-15 LAB — BRAIN NATRIURETIC PEPTIDE: B Natriuretic Peptide: 118 pg/mL — ABNORMAL HIGH (ref 0.0–100.0)

## 2023-06-15 MED ORDER — MAGNESIUM SULFATE 2 GM/50ML IV SOLN
2.0000 g | Freq: Once | INTRAVENOUS | Status: AC
Start: 2023-06-15 — End: 2023-06-15
  Administered 2023-06-15: 2 g via INTRAVENOUS
  Filled 2023-06-15: qty 50

## 2023-06-15 MED ORDER — ALBUTEROL SULFATE (2.5 MG/3ML) 0.083% IN NEBU
5.0000 mg | INHALATION_SOLUTION | Freq: Once | RESPIRATORY_TRACT | Status: AC
  Administered 2023-06-15: 5 mg via RESPIRATORY_TRACT
  Filled 2023-06-15: qty 6

## 2023-06-15 MED ORDER — IPRATROPIUM-ALBUTEROL 0.5-2.5 (3) MG/3ML IN SOLN: 3.0000 mL | RESPIRATORY_TRACT | 0 refills | Status: AC | PRN

## 2023-06-15 MED ORDER — PREDNISONE 20 MG PO TABS: 40.0000 mg | ORAL_TABLET | Freq: Every day | ORAL | 0 refills | Status: AC

## 2023-06-15 MED ORDER — IPRATROPIUM-ALBUTEROL 0.5-2.5 (3) MG/3ML IN SOLN
6.0000 mL | Freq: Once | RESPIRATORY_TRACT | Status: AC
  Administered 2023-06-15: 6 mL via RESPIRATORY_TRACT
  Filled 2023-06-15: qty 6

## 2023-06-15 MED ORDER — ALBUTEROL SULFATE (2.5 MG/3ML) 0.083% IN NEBU: 2.5000 mg | INHALATION_SOLUTION | Freq: Once | RESPIRATORY_TRACT | Status: DC

## 2023-06-15 MED ORDER — METHYLPREDNISOLONE SODIUM SUCC 125 MG IJ SOLR
125.0000 mg | Freq: Once | INTRAMUSCULAR | Status: AC
  Administered 2023-06-15: 125 mg via INTRAVENOUS
  Filled 2023-06-15: qty 2

## 2023-06-15 NOTE — ED Triage Notes (Signed)
 Pt arrived via POV c/o SOB and difficulty breathing since yesterday. Pt reports trying breathing treatments at home w/o relief. Pt presents with strong productive cough and is tachypnic.

## 2023-06-15 NOTE — ED Notes (Signed)
 Pt maintained O2 of 90 to 91% while ambulating in hallway.

## 2023-06-15 NOTE — Discharge Instructions (Addendum)
 It was a pleasure taking care of you today.  You were evaluated in the ER today for shortness of breath and wheezing.  You were treated with breathing treatments, steroids and magnesium  and have had improvement of your symptoms.  Since your oxygen  is not dropping too low when you walk and you are not significantly out of breath with walking anymore you are being discharged with breathing treatments and steroids.  Follow-up with your lung doctor in the next couple of days.  Come back to the ER if you have new or worsening symptoms.

## 2023-06-15 NOTE — ED Provider Notes (Signed)
 [place] EMERGENCY DEPARTMENT AT [facility] Provider Note   CSN: [id number] Arrival date & time: 06/15/23  1403     History  Chief Complaint  Patient presents with   Shortness of Breath    Carolyn Silva is a 73 y.o. female.  He has history of asthma, paroxysmal A-fib on Eliquis , OSA, obesity and anemia she presents to the ER today for evaluation of cough and wheezing that started 2 days ago.  She has been using her albuterol  nebulizer treatments without relief.  She states she does have an inhaler at home, she is unsure of the name but her niece has told her that she should take this every day not only when she is having symptoms that she did not realize.  She denies fever or chills, no sputum production, no chest pain.   Shortness of Breath      Home Medications Prior to Admission medications   Medication Sig Start Date End Date Taking? Authorizing Provider  albuterol  (VENTOLIN  HFA) 108 (90 Base) MCG/ACT inhaler Inhale 2 puffs into the lungs every 4 (four) hours as needed for wheezing or shortness of breath. 06/23/22   Johnson, Clanford L, MD  doxycycline  (VIBRA -TABS) 100 MG tablet Take 1 tablet (100 mg total) by mouth 2 (two) times daily. Patient not taking: Reported on 05/30/2023 02/18/23   Trish Furl, MD  ELIQUIS  5 MG TABS tablet TAKE 1 TABLET BY MOUTH TWICE  DAILY 05/31/23   Strader, Dimple Francis, PA-C  famotidine (PEPCID) 20 MG tablet Take 20 mg by mouth 2 (two) times daily as needed.    [provider]  ferrous sulfate  325 (65 FE) MG tablet Take 1 tablet (325 mg total) by mouth daily with breakfast. 09/21/22   Finis Hugger, Dimple Francis, PA-C  ipratropium-albuterol  (DUONEB) 0.5-2.5 (3) MG/3ML SOLN Take 3 mLs by nebulization in the morning, at noon, and at bedtime. 06/23/22   Johnson, Clanford L, MD  predniSONE  (DELTASONE ) 50 MG tablet Take 1 tablet (50 mg total) by mouth daily. Patient not taking: Reported on 05/30/2023 02/18/23   Trish Furl, MD      Allergies     Strawberry c [ascorbic acid]    Review of Systems   Review of Systems  Respiratory:  Positive for shortness of breath.     Physical Exam Updated Vital Signs BP (!) 168/101   Pulse (!) 106   Temp 97.9 F (36.6 C)   Resp (!) 26   Ht 5\' 5"  (1.651 m)   Wt 116.2 kg   SpO2 94%   BMI 42.63 kg/m  Physical Exam Vitals and nursing note reviewed.  Constitutional:      General: She is not in acute distress.    Appearance: She is well-developed.  HENT:     Head: Normocephalic and atraumatic.  Eyes:     Conjunctiva/sclera: Conjunctivae normal.  Cardiovascular:     Rate and Rhythm: Normal rate and regular rhythm.     Heart sounds: No murmur heard. Pulmonary:     Effort: Pulmonary effort is normal. No respiratory distress.     Breath sounds: Examination of the right-upper field reveals wheezing. Examination of the left-upper field reveals wheezing. Examination of the right-middle field reveals wheezing. Examination of the left-middle field reveals wheezing. Examination of the right-lower field reveals wheezing. Examination of the left-lower field reveals wheezing. Wheezing present.  Abdominal:     Palpations: Abdomen is soft.     Tenderness: There is no abdominal tenderness.  Musculoskeletal:  General: No swelling.     Cervical back: Neck supple.     Right lower leg: No tenderness. No edema.     Left lower leg: No tenderness. No edema.  Skin:    General: Skin is warm and dry.     Capillary Refill: Capillary refill takes less than 2 seconds.  Neurological:     General: No focal deficit present.     Mental Status: She is alert and oriented to person, place, and time.  Psychiatric:        Mood and Affect: Mood normal.     ED Results / Procedures / Treatments   Labs (all labs ordered are listed, but only abnormal results are displayed) Labs Reviewed  CBC WITH DIFFERENTIAL/PLATELET - Abnormal; Notable for the following components:      Result Value   RDW 15.7 (*)    All  other components within normal limits  COMPREHENSIVE METABOLIC PANEL WITH GFR - Abnormal; Notable for the following components:   Glucose, Bld 119 (*)    All other components within normal limits  BRAIN NATRIURETIC PEPTIDE - Abnormal; Notable for the following components:   B Natriuretic Peptide 118.0 (*)    All other components within normal limits  RESP PANEL BY RT-PCR (RSV, FLU A&B, COVID)  [id number]    EKG None  Radiology DG Chest 2 View Result Date: 06/15/2023 CLINICAL DATA:  Shortness of breath. Difficulty breathing since yesterday. EXAM: CHEST - 2 VIEW COMPARISON:  02/18/2023 FINDINGS: Mild cardiac enlargement. No vascular congestion, edema, or consolidation. No pleural effusion or pneumothorax. Mediastinal contours appear intact. Degenerative changes in the spine. IMPRESSION: Cardiac enlargement.  No evidence of active pulmonary disease. Electronically Signed   By: Boyce Byes M.D.   On: 06/15/2023 15:32    Procedures Procedures    Medications Ordered in ED Medications  ipratropium-albuterol  (DUONEB) 0.5-2.5 (3) MG/3ML nebulizer solution 6 mL (has no administration in time range)  methylPREDNISolone  sodium succinate (SOLU-MEDROL ) 125 mg/2 mL injection 125 mg (has no administration in time range)    ED Course/ Medical Decision Making/ A&P                                 Medical Decision Making This patient presents to the ED for concern of SOB and wheezing, this involves an extensive number of treatment options, and is a complaint that carries with it a high risk of complications and morbidity.  The differential diagnosis includes asthma exacerbation, COPD, pneumonia, PE, bronchitis, CHF, ACS, other   Co morbidities that complicate the patient evaluation : Asthma, A-fib   Additional history obtained:  Additional history obtained from EMR External records from outside source obtained and reviewed including prior notes and labs   Lab Tests:  I Ordered, and  personally interpreted labs.  The pertinent results include:    BC-no leukocytosis or anemia CMP normal BNP 118 Respiratory panel-negative for COVID, influenza and RSV  Imaging Studies ordered:  I ordered imaging studies including x-ray chest which shows mild cardiomegaly no pulmonary edema or infiltrates I independently visualized and interpreted imaging within scope of identifying emergent findings  I agree with the radiologist interpretation   Cardiac Monitoring: / EKG:  The patient was maintained on a cardiac monitor.  I personally viewed and interpreted the cardiac monitored which showed an underlying rhythm of: A-fib, no ischemic changes, rate of 96, EKG    Problem List / ED Course /  Critical interventions / Medication management   Shortness of breath and wheezing likely due to asthma exacerbation-started 2 days ago.  Patient with mildly increased work of breathing on my evaluation, saturations between 90 to 94% on room air.  She feels worse with walking.  Will give steroids and nebulizer treatment and magnesium , plan to reevaluate.  On re-evaluation, patient still saturating between 90 and 93%, feels improved but still wheezing, will give more albuterol  and recheck.  Stable patient if she desaturates on ambulation she will need to stay and she understands.  Patient is now feeling much better, still has mild wheezing but much improved, she was ambulated with pulse ox, did not drop below 90% and did not have significant dyspnea or difficulty with ambulation.  She is requesting discharge at this time, she is daily out of the bed and looks well.  She is established with Dr. Matilde Son for pulmonology and states she is going to follow-up closely.  She was given strict return precautions.  Discharged with prednisone  and to continue DuoNebs at home.  I have reviewed the patients home medicines and have made adjustments as needed   Social Determinants of Health:  Lives  independently   Test / Admission - Considered:  Considered need for admission, offered admission patient declines, not requiring oxygen , will discharge home.    Amount and/or Complexity of Data Reviewed Labs: ordered. Radiology: ordered.  Risk Prescription drug management.           Final Clinical Impression(s) / ED Diagnoses Final diagnoses:  None    Rx / DC Orders ED Discharge Orders     None         Aimee Houseman, PA-C 06/15/23 1907    Merdis Stalling, MD 06/15/23 2003

## 2023-06-19 DIAGNOSIS — I10 Essential (primary) hypertension: Secondary | ICD-10-CM | POA: Diagnosis not present

## 2023-06-19 DIAGNOSIS — R7303 Prediabetes: Secondary | ICD-10-CM | POA: Diagnosis not present

## 2023-06-19 DIAGNOSIS — Z0000 Encounter for general adult medical examination without abnormal findings: Secondary | ICD-10-CM | POA: Diagnosis not present

## 2023-06-19 DIAGNOSIS — Z23 Encounter for immunization: Secondary | ICD-10-CM | POA: Diagnosis not present

## 2023-06-19 DIAGNOSIS — E785 Hyperlipidemia, unspecified: Secondary | ICD-10-CM | POA: Diagnosis not present

## 2023-06-19 DIAGNOSIS — D649 Anemia, unspecified: Secondary | ICD-10-CM | POA: Diagnosis not present

## 2023-06-19 DIAGNOSIS — G4733 Obstructive sleep apnea (adult) (pediatric): Secondary | ICD-10-CM | POA: Diagnosis not present

## 2023-06-19 DIAGNOSIS — I4891 Unspecified atrial fibrillation: Secondary | ICD-10-CM | POA: Diagnosis not present

## 2023-06-19 DIAGNOSIS — R809 Proteinuria, unspecified: Secondary | ICD-10-CM | POA: Diagnosis not present

## 2023-06-19 DIAGNOSIS — K219 Gastro-esophageal reflux disease without esophagitis: Secondary | ICD-10-CM | POA: Diagnosis not present

## 2023-06-23 DIAGNOSIS — J9600 Acute respiratory failure, unspecified whether with hypoxia or hypercapnia: Secondary | ICD-10-CM | POA: Diagnosis not present

## 2023-06-26 ENCOUNTER — Ambulatory Visit (INDEPENDENT_AMBULATORY_CARE_PROVIDER_SITE_OTHER): Admitting: Primary Care

## 2023-06-26 VITALS — BP 134/78 | HR 98 | Ht 65.0 in | Wt 258.0 lb

## 2023-06-26 DIAGNOSIS — Z7901 Long term (current) use of anticoagulants: Secondary | ICD-10-CM

## 2023-06-26 DIAGNOSIS — I4891 Unspecified atrial fibrillation: Secondary | ICD-10-CM

## 2023-06-26 DIAGNOSIS — Z7951 Long term (current) use of inhaled steroids: Secondary | ICD-10-CM | POA: Diagnosis not present

## 2023-06-26 DIAGNOSIS — J45909 Unspecified asthma, uncomplicated: Secondary | ICD-10-CM

## 2023-06-26 DIAGNOSIS — J449 Chronic obstructive pulmonary disease, unspecified: Secondary | ICD-10-CM

## 2023-06-26 DIAGNOSIS — G4733 Obstructive sleep apnea (adult) (pediatric): Secondary | ICD-10-CM | POA: Diagnosis not present

## 2023-06-26 NOTE — Progress Notes (Signed)
 @Patient  ID: Doc Freed, female    DOB: 1950-07-11, 73 y.o.   MRN: [id number]  Chief Complaint  Patient presents with   Sleep Apnea    Referring provider: Omie Bickers, MD  HPI: 73 year old female, never smoked. PMH significant for HTN, afib, COPD, CAP, GERD, obesity, iron deficiency anemia, prolonged QT interval.   Admitted to [facility] in May 2021 for acute on chronic hypercarbic respiratory failure  Moderate obstructive sleep apnea with an AHI of 28.4 and SpO2 low of 72%. - She did best with Bipap 19/15 cm H2O  06/26/2023- Interim hx  Discussed the use of AI scribe software for clinical note transcription with the patient, who gave verbal consent to proceed.  History of Present Illness   MADALIN QUILLIN is a 73 year old female with sleep apnea who presents for follow-up regarding her BiPAP use.  She has a history of moderate to severe sleep apnea diagnosed in 2021, for which she was prescribed a NIV machine. She is not currently using the NIV machine, which was replaced with a new one earlier this year. The previous machine was taken away, and she is unsure of the name of the person who provided the new machine.  She has concerns about using the NIV machine due to issues with oxygen  supply and financial constraints. The oxygen  component of the machine was not functioning properly, and she has not had the funds to address this issue. She plans to resolve the financial aspect by June to reinstate the oxygen  supply.  Despite not using the NIV, she reports sleeping well, stating 'I sleep so good' and better than when using the machine. Her sleep schedule varies, typically going to bed between 10 and 11 PM, and she wakes up around 5:30 or 6 AM. She occasionally wakes up once at night to use the restroom but falls back asleep easily. She sometimes naps during the day, especially while watching TV, but denies falling asleep during activities like driving or talking on the  phone.  She has experienced significant weight loss from 281 pounds at the time of her sleep study in 2021 to 256 pounds currently. She attributes some of this weight loss to a hospital visit where she weighed 298 pounds.  She was last admitted to the hospital in July 2024 for pneumonia but has not been admitted since. No current shortness of breath during the day and does not use oxygen  during the day.     Allergies  Allergen Reactions   Strawberry C [Ascorbic Acid] Hives    Immunization History  Administered Date(s) Administered   Moderna Sars-Covid-2 Vaccination 05/03/2019, 06/04/2019    Past Medical History:  Diagnosis Date   Asthma    Hypertension    OSA (obstructive sleep apnea)    Paroxysmal atrial fibrillation (HCC)    Paroxysmal atrial flutter (HCC)     Tobacco History: Social History   Tobacco Use  Smoking Status Never  Smokeless Tobacco Never   Counseling given: Not Answered   Outpatient Medications Prior to Visit  Medication Sig Dispense Refill   albuterol  (PROVENTIL ) (2.5 MG/3ML) 0.083% nebulizer solution Take 3 mLs by nebulization 3 (three) times daily as needed for wheezing or shortness of breath.     albuterol  (VENTOLIN  HFA) 108 (90 Base) MCG/ACT inhaler Inhale 2 puffs into the lungs every 4 (four) hours as needed for wheezing or shortness of breath. 18 g 2   Ascorbic Acid (VITAMIN C) 100 MG tablet Take  100 mg by mouth daily.     DULERA  100-5 MCG/ACT AERO Inhale 2 puffs into the lungs 2 (two) times daily.     ELIQUIS  5 MG TABS tablet TAKE 1 TABLET BY MOUTH TWICE  DAILY 200 tablet 2   famotidine (PEPCID) 20 MG tablet Take 20 mg by mouth 2 (two) times daily as needed.     ferrous sulfate  325 (65 FE) MG tablet Take 1 tablet (325 mg total) by mouth daily with breakfast. 90 tablet 3   ipratropium-albuterol  (DUONEB) 0.5-2.5 (3) MG/3ML SOLN Take 3 mLs by nebulization in the morning, at noon, and at bedtime. 360 mL 0   ipratropium-albuterol  (DUONEB) 0.5-2.5 (3)  MG/3ML SOLN Take 3 mLs by nebulization every 4 (four) hours as needed (for wheezing). 360 mL 0   Iron Combinations (IRON COMPLEX PO) Take by mouth.     losartan  (COZAAR ) 100 MG tablet Take 100 mg by mouth daily.     pantoprazole  (PROTONIX ) 20 MG tablet Take 20 mg by mouth daily.     doxycycline  (VIBRA -TABS) 100 MG tablet Take 100 mg by mouth 2 (two) times daily. (Patient not taking: Reported on 06/26/2023)     doxycycline  (VIBRA -TABS) 100 MG tablet Take 1 tablet (100 mg total) by mouth 2 (two) times daily. (Patient not taking: Reported on 05/30/2023) 14 tablet 0   No facility-administered medications prior to visit.   Review of Systems  Review of Systems  Constitutional: Negative.  Negative for fatigue.  HENT: Negative.    Respiratory: Negative.  Negative for shortness of breath.   Cardiovascular: Negative.  Negative for palpitations.   Physical Exam  BP 134/78 (BP Location: Left Arm)   Pulse 98   Ht 5\' 5"  (1.651 m)   Wt 258 lb (117 kg)   SpO2 98% Comment: RA  BMI 42.93 kg/m  Physical Exam Constitutional:      General: She is not in acute distress.    Appearance: Normal appearance. She is obese. She is not ill-appearing.  HENT:     Head: Normocephalic and atraumatic.     Mouth/Throat:     Mouth: Mucous membranes are moist.     Pharynx: Oropharynx is clear.  Cardiovascular:     Rate and Rhythm: Normal rate. Rhythm irregular.  Pulmonary:     Effort: Pulmonary effort is normal.     Breath sounds: Normal breath sounds. No wheezing or rhonchi.  Musculoskeletal:        General: Normal range of motion.  Skin:    General: Skin is warm and dry.  Neurological:     General: No focal deficit present.     Mental Status: She is alert and oriented to person, place, and time. Mental status is at baseline.  Psychiatric:        Mood and Affect: Mood normal.        Behavior: Behavior normal.        Thought Content: Thought content normal.        Judgment: Judgment normal.      Lab  Results:  CBC    Component Value Date/Time   WBC 9.0 06/15/2023 1436   RBC 4.52 06/15/2023 1436   HGB 13.3 06/15/2023 1436   HGB 13.7 06/01/2023 0903   HCT 41.2 06/15/2023 1436   HCT 42.6 06/01/2023 0903   PLT 205 06/15/2023 1436   PLT 213 06/01/2023 0903   MCV 91.2 06/15/2023 1436   MCV 90 06/01/2023 0903   MCH 29.4 06/15/2023 1436   MCHC  32.3 06/15/2023 1436   RDW 15.7 (H) 06/15/2023 1436   RDW 17.2 (H) 06/01/2023 0903   LYMPHSABS 1.2 06/15/2023 1436   MONOABS 0.8 06/15/2023 1436   EOSABS 0.5 06/15/2023 1436   BASOSABS 0.0 06/15/2023 1436    BMET    Component Value Date/Time   NA 137 06/15/2023 1436   K 3.6 06/15/2023 1436   CL 101 06/15/2023 1436   CO2 25 06/15/2023 1436   GLUCOSE 119 (H) 06/15/2023 1436   BUN 9 06/15/2023 1436   CREATININE 0.62 06/15/2023 1436   CALCIUM 9.4 06/15/2023 1436   GFRNONAA >60 06/15/2023 1436   GFRAA >60 07/28/2019 0848    BNP    Component Value Date/Time   BNP 118.0 (H) 06/15/2023 1436    ProBNP No results found for: "PROBNP"  Imaging: DG Chest 2 View Result Date: 06/15/2023 CLINICAL DATA:  Shortness of breath. Difficulty breathing since yesterday. EXAM: CHEST - 2 VIEW COMPARISON:  02/18/2023 FINDINGS: Mild cardiac enlargement. No vascular congestion, edema, or consolidation. No pleural effusion or pneumothorax. Mediastinal contours appear intact. Degenerative changes in the spine. IMPRESSION: Cardiac enlargement.  No evidence of active pulmonary disease. Electronically Signed   By: Boyce Byes M.D.   On: 06/15/2023 15:32     Assessment & Plan:   No problem-specific Assessment & Plan notes found for this encounter.   Assessment and Plan    Obstructive Sleep Apnea Moderate to severe obstructive sleep apnea diagnosed in 2021. Currently not using BiPAP due to concerns about oxygen  supply and machine functionality. Reports sleeping well without the machine, with no significant daytime sleepiness or snoring-related  awakenings. Weight has decreased from 281 lbs to 256 lbs since last sleep study, potentially impacting apnea severity. Uncertain if current noninvasive ventilator is necessary. - Check nocturnal oxygen  saturation. - Check BMET to assess for CO2 retention. - Conduct walk test for exertional oxygen  saturation. - Determine current machine type and supplier contact information. - Reassess need for noninvasive ventilator based on test results.  Afib - Continue Eliquis  5mg  twice daily   Asthma Seen in ED on 06/15/23 for asthma exacerbation. Treated with Albuterol  nebulizer. RSV, influenza and covid were negative. CXR showed mild cardiomegaly without pulmonary edema.  - Continue Dulera  100mcg two puffs twice daily and Ipratropium-albuterol  q 6 hours for breakthrough symptoms    Antonio Baumgarten, NP 06/26/2023

## 2023-06-26 NOTE — Patient Instructions (Addendum)
-  OBSTRUCTIVE SLEEP APNEA: Obstructive sleep apnea is a condition where your breathing stops and starts repeatedly during sleep due to blocked airways. We will check your nocturnal oxygen  levels, perform a blood test for CO2 retention, and conduct a walk test to see how your oxygen  levels change with activity. We will also determine the type of machine you have and get the contact information for the supplier. Based on these results, we will reassess whether you need to continue using the BiPAP machine.  INSTRUCTIONS: Please follow up with the recommended tests: nocturnal oxygen  saturation, blood test for CO2 retention, and a walk test for exertional oxygen  saturation. We will contact you with the results and discuss the next steps regarding your BiPAP machine.  Do overnight oximetry test while sleeping normally without oxygen  or device  Please let me know the name of your machine and contact person who brought you machine  Follow-up 3 months with [facility] NP or sooner if needed

## 2023-06-27 ENCOUNTER — Telehealth: Payer: Self-pay

## 2023-06-27 LAB — BASIC METABOLIC PANEL WITH GFR
BUN/Creatinine Ratio: 13 (ref 12–28)
BUN: 10 mg/dL (ref 8–27)
CO2: 23 mmol/L (ref 20–29)
Calcium: 9.4 mg/dL (ref 8.7–10.3)
Chloride: 103 mmol/L (ref 96–106)
Creatinine, Ser: 0.76 mg/dL (ref 0.57–1.00)
Glucose: 109 mg/dL — ABNORMAL HIGH (ref 70–99)
Potassium: 4 mmol/L (ref 3.5–5.2)
Sodium: 143 mmol/L (ref 134–144)
eGFR: 84 mL/min/{1.73_m2} (ref >59)

## 2023-06-27 NOTE — Telephone Encounter (Signed)
 Neither phone number seems to go to any currently open business.  An internet search for "Name of CPAP machine is: ZOCSN" shows: ZOCSN: This likely refers to a specific product line or brand name within [employer]'s offerings, particularly related to CPAP cleaning and sanitization.   There does not appear to be a CPAP machine with this acronym or name.   Per [employer] she should have a V-Home NIV.    [employer] to fax compliance/therapy report. Will hold message pending fax.

## 2023-06-27 NOTE — Telephone Encounter (Signed)
 Copied from CRM [id number]. Topic: Clinical - Medical Advice >> Jun 27, 2023 10:13 AM Isabell A wrote: Reason for CRM: Patient was told to call back with the following information:  [facility] [phone]   Fragrance: Always & forever >> Jun 27, 2023 10:23 AM Margarette Shawl wrote: Pt contacted clinic back, she realized she provided incorrect information for previous message. Information below was requested by provider  Name of CPAP machine is: ZOCSN  Contact number for company that provided machine:  # [phone]

## 2023-07-03 NOTE — Telephone Encounter (Signed)
 Fax received and routed to provider for review. Nothing further needed at this time.

## 2023-07-03 NOTE — Telephone Encounter (Signed)
 Fax report not received.  Contacted [employer] again. They are sending compliance/therapy report for April/May.

## 2023-07-18 ENCOUNTER — Encounter (HOSPITAL_COMMUNITY): Admission: RE | Admit: 2023-07-18 | Source: Ambulatory Visit

## 2023-07-24 ENCOUNTER — Ambulatory Visit (HOSPITAL_COMMUNITY): Admit: 2023-07-24 | Admitting: Ophthalmology

## 2023-07-24 ENCOUNTER — Encounter (HOSPITAL_COMMUNITY): Payer: Self-pay

## 2023-07-24 DIAGNOSIS — J9600 Acute respiratory failure, unspecified whether with hypoxia or hypercapnia: Secondary | ICD-10-CM | POA: Diagnosis not present

## 2023-07-24 SURGERY — PHACOEMULSIFICATION, CATARACT, WITH IOL INSERTION
Anesthesia: Monitor Anesthesia Care | Laterality: Right

## 2023-08-02 ENCOUNTER — Other Ambulatory Visit (HOSPITAL_COMMUNITY)

## 2023-08-07 ENCOUNTER — Ambulatory Visit (HOSPITAL_COMMUNITY): Admit: 2023-08-07 | Admitting: Ophthalmology

## 2023-08-07 SURGERY — PHACOEMULSIFICATION, CATARACT, WITH IOL INSERTION
Anesthesia: Monitor Anesthesia Care | Laterality: Left

## 2023-08-10 ENCOUNTER — Other Ambulatory Visit: Payer: Self-pay | Admitting: Cardiology

## 2023-08-23 DIAGNOSIS — J9600 Acute respiratory failure, unspecified whether with hypoxia or hypercapnia: Secondary | ICD-10-CM | POA: Diagnosis not present

## 2023-08-24 ENCOUNTER — Ambulatory Visit: Attending: Cardiology | Admitting: Cardiology

## 2023-08-24 ENCOUNTER — Encounter: Payer: Self-pay | Admitting: Cardiology

## 2023-08-24 VITALS — BP 140/80 | HR 90 | Ht 65.0 in | Wt 259.0 lb

## 2023-08-24 DIAGNOSIS — I10 Essential (primary) hypertension: Secondary | ICD-10-CM | POA: Diagnosis not present

## 2023-08-24 DIAGNOSIS — G4733 Obstructive sleep apnea (adult) (pediatric): Secondary | ICD-10-CM

## 2023-08-24 DIAGNOSIS — I4819 Other persistent atrial fibrillation: Secondary | ICD-10-CM

## 2023-08-24 NOTE — Patient Instructions (Signed)
 Medication Instructions:  Your physician recommends that you continue on your current medications as directed. Please refer to the Current Medication list given to you today.   Labwork: None today  Testing/Procedures: None today  Follow-Up: 6 months  Any Other Special Instructions Will Be Listed Below (If Applicable).  If you need a refill on your cardiac medications before your next appointment, please call your pharmacy.

## 2023-08-24 NOTE — Progress Notes (Signed)
    Cardiology Office Note  Date: 08/24/2023   ID: Carolyn Silva, DOB 12/02/1950, MRN [id number]  History of Present Illness: Carolyn Silva is a 73 y.o. female last seen in October 2024 by Ms. Strader PA-C, I reviewed her note.  Records indicate ER evaluation in May with suspected asthma exacerbation, she was incidentally noted to be in atrial fibrillation at that time by follow-up ECG.  She is here today for follow-up, states that she feels quite well, does not notice any sense of palpitations and reports NYHA class II dyspnea.  We went over her medications.  She remains on Eliquis  5 mg twice daily for stroke prophylaxis, not on any AV nodal blockers.  She is in atrial fibrillation based on examination today, although heart rate controlled at baseline.  We discussed treatment options and plan at this point is to maintain strategy of heart rate control and anticoagulation without pursuing further cardioversion attempts.  In that case she would require antiarrhythmic therapy.  Physical Exam: VS:  BP (!) 140/80 (BP Location: Left Arm, Patient Position: Sitting, Cuff Size: Normal)   Pulse 90   Ht 5' 5 (1.651 m)   Wt 259 lb (117.5 kg)   SpO2 97%   BMI 43.10 kg/m , BMI Body mass index is 43.1 kg/m.  Wt Readings from Last 3 Encounters:  08/24/23 259 lb (117.5 kg)  06/26/23 258 lb (117 kg)  06/15/23 256 lb 2.8 oz (116.2 kg)    General: Patient appears comfortable at rest. HEENT: Conjunctiva and lids normal. Neck: Supple, no elevated JVP or carotid bruits. Lungs: Decreased breath sounds, nonlabored breathing at rest. Cardiac: Irregularly irregular without gallop. Extremities: No pitting edema.  ECG:  An ECG dated 06/15/2023 was personally reviewed today and demonstrated:  Rate controlled atrial fibrillation, rule out old inferior infarct pattern.  Labwork: 02/18/2023: Magnesium  2.0 06/15/2023: ALT 19; AST 23; B Natriuretic Peptide 118.0; Hemoglobin 13.3; Platelets 205 06/26/2023: BUN  10; Creatinine, Ser 0.76; Potassium 4.0; Sodium 143   Other Studies Reviewed Today:  No interval cardiac testing for review today.  Assessment and Plan:  1.  Paroxysmal to persistent atrial fibrillation and flutter.  CHA2DS2-VASc score is 3.  Most recently status post elective DCCV in August 2024.  Her most recent ECG in May shows a return to atrial fibrillation.  Echocardiogram in July 2024 revealed LVEF 60 to 65% with moderate left atrial enlargement.  She is symptomatically stable, unaware of any palpitations and reports NYHA class II dyspnea.  As discussed above, plan will be to maintain strategy of heart rate control and anticoagulation at this point.  Continue Eliquis  5 mg twice daily for stroke prophylaxis.  She has not required AV nodal blockers for heart rate control.   2.  OSA on CPAP.   3.  Primary hypertension.  Continue Cozaar  100 mg daily.   4.  History of nocturnal sinus pauses, not clearly symptomatic.  Disposition:  Follow up 6 months.  Signed, Jayson JUDITHANN Sierras, M.D., F.A.C.C. [facility]

## 2023-09-23 DIAGNOSIS — J9600 Acute respiratory failure, unspecified whether with hypoxia or hypercapnia: Secondary | ICD-10-CM | POA: Diagnosis not present

## 2023-10-13 DIAGNOSIS — E785 Hyperlipidemia, unspecified: Secondary | ICD-10-CM | POA: Diagnosis not present

## 2023-10-13 DIAGNOSIS — R809 Proteinuria, unspecified: Secondary | ICD-10-CM | POA: Diagnosis not present

## 2023-10-13 DIAGNOSIS — R7303 Prediabetes: Secondary | ICD-10-CM | POA: Diagnosis not present

## 2023-10-13 DIAGNOSIS — D649 Anemia, unspecified: Secondary | ICD-10-CM | POA: Diagnosis not present

## 2023-10-19 DIAGNOSIS — K219 Gastro-esophageal reflux disease without esophagitis: Secondary | ICD-10-CM | POA: Diagnosis not present

## 2023-10-19 DIAGNOSIS — D649 Anemia, unspecified: Secondary | ICD-10-CM | POA: Diagnosis not present

## 2023-10-19 DIAGNOSIS — E785 Hyperlipidemia, unspecified: Secondary | ICD-10-CM | POA: Diagnosis not present

## 2023-10-19 DIAGNOSIS — I4891 Unspecified atrial fibrillation: Secondary | ICD-10-CM | POA: Diagnosis not present

## 2023-10-19 DIAGNOSIS — G4733 Obstructive sleep apnea (adult) (pediatric): Secondary | ICD-10-CM | POA: Diagnosis not present

## 2023-10-19 DIAGNOSIS — I10 Essential (primary) hypertension: Secondary | ICD-10-CM | POA: Diagnosis not present

## 2023-10-19 DIAGNOSIS — E870 Hyperosmolality and hypernatremia: Secondary | ICD-10-CM | POA: Diagnosis not present

## 2023-10-19 DIAGNOSIS — Z23 Encounter for immunization: Secondary | ICD-10-CM | POA: Diagnosis not present

## 2023-10-19 DIAGNOSIS — R809 Proteinuria, unspecified: Secondary | ICD-10-CM | POA: Diagnosis not present

## 2023-10-19 DIAGNOSIS — R7303 Prediabetes: Secondary | ICD-10-CM | POA: Diagnosis not present

## 2023-10-24 DIAGNOSIS — J9600 Acute respiratory failure, unspecified whether with hypoxia or hypercapnia: Secondary | ICD-10-CM | POA: Diagnosis not present

## 2023-11-03 ENCOUNTER — Other Ambulatory Visit (HOSPITAL_COMMUNITY): Payer: Self-pay | Admitting: Nurse Practitioner

## 2023-11-03 DIAGNOSIS — Z1231 Encounter for screening mammogram for malignant neoplasm of breast: Secondary | ICD-10-CM

## 2024-01-08 ENCOUNTER — Ambulatory Visit (HOSPITAL_COMMUNITY)
Admission: RE | Admit: 2024-01-08 | Discharge: 2024-01-08 | Disposition: A | Discharge status: Home / Self Care | Source: Ambulatory Visit | Attending: Nurse Practitioner | Admitting: Nurse Practitioner

## 2024-01-08 DIAGNOSIS — Z1231 Encounter for screening mammogram for malignant neoplasm of breast: Secondary | ICD-10-CM | POA: Diagnosis present

## 2024-02-04 ENCOUNTER — Other Ambulatory Visit: Payer: Self-pay | Admitting: Student

## 2024-02-04 DIAGNOSIS — I4819 Other persistent atrial fibrillation: Secondary | ICD-10-CM

## 2024-02-05 NOTE — Telephone Encounter (Signed)
 Pt last saw Dr Debera 08/24/23, last labs 06/26/23 Creat 0.76, age 73, weight 117.5kg, based on specified criteria pt is on appropriate dosage of Eliquis  5mg  BID for afib.  Will refill rx.

## 2024-03-01 ENCOUNTER — Encounter: Payer: Self-pay | Admitting: Cardiology

## 2024-03-01 ENCOUNTER — Ambulatory Visit: Attending: Cardiology | Admitting: Cardiology

## 2024-03-01 VITALS — BP 136/78 | HR 89 | Ht 65.0 in | Wt 264.4 lb

## 2024-03-01 DIAGNOSIS — I10 Essential (primary) hypertension: Secondary | ICD-10-CM

## 2024-03-01 DIAGNOSIS — G4733 Obstructive sleep apnea (adult) (pediatric): Secondary | ICD-10-CM | POA: Diagnosis not present

## 2024-03-01 DIAGNOSIS — I4821 Permanent atrial fibrillation: Secondary | ICD-10-CM | POA: Diagnosis not present

## 2024-03-01 NOTE — Progress Notes (Signed)
"  ° ° °  Cardiology Office Note  Date: 03/01/2024   ID: Carolyn Silva, DOB 1950-08-26, MRN [id number]  History of Present Illness: Carolyn Silva is a 74 y.o. female last seen in July 2025.  She is here for a routine visit.  States that she has been doing very well, no palpitations or unusual shortness of breath with typical activities, no dizziness or syncope.  We went over her medications.  She reports no spontaneous bleeding problems on Eliquis .  She has not required any AV nodal blockers for heart rate control of atrial fibrillation.  I reviewed he interval lab work which is noted below.  Physical Exam: VS:  BP 136/78   Pulse 89   Ht 5' 5 (1.651 m)   Wt 264 lb 6.4 oz (119.9 kg)   SpO2 96%   BMI 44.00 kg/m , BMI Body mass index is 44 kg/m.  Wt Readings from Last 3 Encounters:  03/01/24 264 lb 6.4 oz (119.9 kg)  08/24/23 259 lb (117.5 kg)  06/26/23 258 lb (117 kg)    General: Patient appears comfortable at rest. HEENT: Conjunctiva and lids normal. Neck: Supple, no elevated JVP or carotid bruits. Lungs: Clear to auscultation, nonlabored breathing at rest. Cardiac: Irregularly irregular, no S3 or significant systolic murmur, no pericardial rub.  ECG:  An ECG dated 06/15/2023 was personally reviewed today and demonstrated:  Rate controlled atrial fibrillation, rule out old inferior infarct pattern.  Labwork: 06/15/2023: ALT 19; AST 23; B Natriuretic Peptide 118.0; Hemoglobin 13.3; Platelets 205 06/26/2023: BUN 10; Creatinine, Ser 0.76; Potassium 4.0; Sodium 143  August 2025: Cholesterol 154, triglycerides 83, HDL 51, LDL 87, hemoglobin 13.6, platelets 227, BUN 12, creatinine 0.82, potassium 4.1, AST 17, ALT 14  Other Studies Reviewed Today:  No interval cardiac testing for review today.  Assessment and Plan:  1.  Permanent atrial fibrillation.  CHA2DS2-VASc score is 3.  She is asymptomatic, had recurrence after initial successful cardioversion and heart rate controlled in the  absence of any AV nodal blockers.  Plan to continue Eliquis  5 mg twice daily for stroke prophylaxis.   2.  OSA on CPAP.  He reports compliance.   3.  Primary hypertension.  Continue Cozaar  100 mg daily.   4.  History of nocturnal sinus pauses, not clearly symptomatic.  Disposition:  Follow up 6 months.  Signed, Jayson JUDITHANN Sierras, M.D., F.A.C.C. [facility] "

## 2024-03-01 NOTE — Patient Instructions (Signed)
 Medication Instructions:   Your physician recommends that you continue on your current medications as directed. Please refer to the Current Medication list given to you today.   Labwork: None today  Testing/Procedures: None today  Follow-Up: 6 months Dr.McDowell  Any Other Special Instructions Will Be Listed Below (If Applicable).  If you need a refill on your cardiac medications before your next appointment, please call your pharmacy.

## 2024-04-30 ENCOUNTER — Ambulatory Visit: Admitting: Gastroenterology
